# Patient Record
Sex: Male | Born: 1940 | Race: White | Hispanic: No | Marital: Single | Smoking: Former smoker
Health system: Southern US, Community
[De-identification: ages and names within clinical notes are randomized; demographics above are authoritative.]

## PROBLEM LIST (undated history)

## (undated) DIAGNOSIS — R338 Other retention of urine: Secondary | ICD-10-CM

## (undated) DIAGNOSIS — F0390 Unspecified dementia without behavioral disturbance: Secondary | ICD-10-CM

## (undated) DIAGNOSIS — N401 Enlarged prostate with lower urinary tract symptoms: Secondary | ICD-10-CM

## (undated) DIAGNOSIS — N138 Other obstructive and reflux uropathy: Secondary | ICD-10-CM

## (undated) DIAGNOSIS — R911 Solitary pulmonary nodule: Secondary | ICD-10-CM

## (undated) DIAGNOSIS — I10 Essential (primary) hypertension: Secondary | ICD-10-CM

## (undated) DIAGNOSIS — J45909 Unspecified asthma, uncomplicated: Secondary | ICD-10-CM

## (undated) DIAGNOSIS — I214 Non-ST elevation (NSTEMI) myocardial infarction: Secondary | ICD-10-CM

## (undated) DIAGNOSIS — E785 Hyperlipidemia, unspecified: Secondary | ICD-10-CM

## (undated) DIAGNOSIS — J449 Chronic obstructive pulmonary disease, unspecified: Secondary | ICD-10-CM

## (undated) DIAGNOSIS — I25110 Atherosclerotic heart disease of native coronary artery with unstable angina pectoris: Secondary | ICD-10-CM

## (undated) DIAGNOSIS — IMO0002 Reserved for concepts with insufficient information to code with codable children: Secondary | ICD-10-CM

## (undated) DIAGNOSIS — Z955 Presence of coronary angioplasty implant and graft: Secondary | ICD-10-CM

## (undated) DIAGNOSIS — R972 Elevated prostate specific antigen [PSA]: Secondary | ICD-10-CM

## (undated) HISTORY — PX: HERNIA REPAIR: SHX51

## (undated) HISTORY — PX: PROSTATE BIOPSY: SHX241

## (undated) HISTORY — PX: TONSILLECTOMY: SUR1361

---

## 2009-11-01 ENCOUNTER — Inpatient Hospital Stay (HOSPITAL_COMMUNITY): Admission: EM | Admit: 2009-11-01 | Discharge: 2009-11-05 | Payer: Self-pay | Admitting: Emergency Medicine

## 2009-11-01 ENCOUNTER — Encounter (INDEPENDENT_AMBULATORY_CARE_PROVIDER_SITE_OTHER): Payer: Self-pay | Admitting: Surgery

## 2011-01-08 LAB — COMPREHENSIVE METABOLIC PANEL
ALT: 16 U/L (ref 0–53)
Alkaline Phosphatase: 70 U/L (ref 39–117)
BUN: 17 mg/dL (ref 6–23)
Chloride: 99 mEq/L (ref 96–112)
Glucose, Bld: 114 mg/dL — ABNORMAL HIGH (ref 70–99)
Potassium: 3.8 mEq/L (ref 3.5–5.1)
Sodium: 131 mEq/L — ABNORMAL LOW (ref 135–145)
Total Bilirubin: 0.9 mg/dL (ref 0.3–1.2)

## 2011-01-08 LAB — CBC
HCT: 41.3 % (ref 39.0–52.0)
HCT: 41.7 % (ref 39.0–52.0)
HCT: 44.1 % (ref 39.0–52.0)
Hemoglobin: 14 g/dL (ref 13.0–17.0)
Hemoglobin: 14.7 g/dL (ref 13.0–17.0)
MCV: 89.6 fL (ref 78.0–100.0)
Platelets: 280 10*3/uL (ref 150–400)
RBC: 4.61 MIL/uL (ref 4.22–5.81)
RBC: 4.9 MIL/uL (ref 4.22–5.81)
RDW: 14 % (ref 11.5–15.5)
WBC: 14.3 10*3/uL — ABNORMAL HIGH (ref 4.0–10.5)

## 2011-01-08 LAB — URINALYSIS, ROUTINE W REFLEX MICROSCOPIC
Hgb urine dipstick: NEGATIVE
Nitrite: NEGATIVE
Protein, ur: NEGATIVE mg/dL
Specific Gravity, Urine: 1.025 (ref 1.005–1.030)
Urobilinogen, UA: 0.2 mg/dL (ref 0.0–1.0)

## 2011-01-08 LAB — DIFFERENTIAL
Basophils Absolute: 0 10*3/uL (ref 0.0–0.1)
Basophils Absolute: 0.1 10*3/uL (ref 0.0–0.1)
Basophils Relative: 1 % (ref 0–1)
Eosinophils Absolute: 0.1 10*3/uL (ref 0.0–0.7)
Eosinophils Absolute: 0.3 10*3/uL (ref 0.0–0.7)
Eosinophils Relative: 0 % (ref 0–5)
Eosinophils Relative: 3 % (ref 0–5)
Lymphocytes Relative: 7 % — ABNORMAL LOW (ref 12–46)
Lymphs Abs: 0.9 10*3/uL (ref 0.7–4.0)
Monocytes Absolute: 0.8 10*3/uL (ref 0.1–1.0)
Monocytes Absolute: 1.4 10*3/uL — ABNORMAL HIGH (ref 0.1–1.0)
Monocytes Relative: 10 % (ref 3–12)
Monocytes Relative: 6 % (ref 3–12)
Neutro Abs: 10.9 10*3/uL — ABNORMAL HIGH (ref 1.7–7.7)
Neutrophils Relative %: 77 % (ref 43–77)

## 2014-06-30 ENCOUNTER — Emergency Department (HOSPITAL_COMMUNITY): Payer: Medicare Other

## 2014-06-30 ENCOUNTER — Encounter (HOSPITAL_COMMUNITY): Payer: Self-pay | Admitting: Emergency Medicine

## 2014-06-30 ENCOUNTER — Emergency Department (HOSPITAL_COMMUNITY)
Admission: EM | Admit: 2014-06-30 | Discharge: 2014-06-30 | Disposition: A | Discharge status: Home / Self Care | Payer: Medicare Other | Attending: Emergency Medicine | Admitting: Emergency Medicine

## 2014-06-30 DIAGNOSIS — Z872 Personal history of diseases of the skin and subcutaneous tissue: Secondary | ICD-10-CM | POA: Insufficient documentation

## 2014-06-30 DIAGNOSIS — J45901 Unspecified asthma with (acute) exacerbation: Secondary | ICD-10-CM | POA: Diagnosis not present

## 2014-06-30 DIAGNOSIS — R42 Dizziness and giddiness: Secondary | ICD-10-CM | POA: Diagnosis present

## 2014-06-30 DIAGNOSIS — J40 Bronchitis, not specified as acute or chronic: Secondary | ICD-10-CM

## 2014-06-30 DIAGNOSIS — R269 Unspecified abnormalities of gait and mobility: Secondary | ICD-10-CM | POA: Insufficient documentation

## 2014-06-30 HISTORY — DX: Unspecified asthma, uncomplicated: J45.909

## 2014-06-30 HISTORY — DX: Reserved for concepts with insufficient information to code with codable children: IMO0002

## 2014-06-30 LAB — BASIC METABOLIC PANEL
Anion gap: 13 (ref 5–15)
BUN: 15 mg/dL (ref 6–23)
CHLORIDE: 98 meq/L (ref 96–112)
CO2: 27 mEq/L (ref 19–32)
CREATININE: 1.26 mg/dL (ref 0.50–1.35)
Calcium: 10 mg/dL (ref 8.4–10.5)
GFR calc Af Amer: 64 mL/min — ABNORMAL LOW (ref >90)
GFR, EST NON AFRICAN AMERICAN: 55 mL/min — AB (ref >90)
GLUCOSE: 120 mg/dL — AB (ref 70–99)
POTASSIUM: 3.9 meq/L (ref 3.7–5.3)
Sodium: 138 mEq/L (ref 137–147)

## 2014-06-30 LAB — URINALYSIS, ROUTINE W REFLEX MICROSCOPIC
Bilirubin Urine: NEGATIVE
GLUCOSE, UA: NEGATIVE mg/dL
HGB URINE DIPSTICK: NEGATIVE
Ketones, ur: NEGATIVE mg/dL
LEUKOCYTES UA: NEGATIVE
Nitrite: NEGATIVE
PH: 6 (ref 5.0–8.0)
Protein, ur: NEGATIVE mg/dL
Specific Gravity, Urine: 1.01 (ref 1.005–1.030)
Urobilinogen, UA: 0.2 mg/dL (ref 0.0–1.0)

## 2014-06-30 LAB — CBC WITH DIFFERENTIAL/PLATELET
Basophils Absolute: 0 10*3/uL (ref 0.0–0.1)
Basophils Relative: 0 % (ref 0–1)
EOS ABS: 0.4 10*3/uL (ref 0.0–0.7)
Eosinophils Relative: 2 % (ref 0–5)
HEMATOCRIT: 45.8 % (ref 39.0–52.0)
HEMOGLOBIN: 15.8 g/dL (ref 13.0–17.0)
LYMPHS ABS: 2 10*3/uL (ref 0.7–4.0)
Lymphocytes Relative: 11 % — ABNORMAL LOW (ref 12–46)
MCH: 30.4 pg (ref 26.0–34.0)
MCHC: 34.5 g/dL (ref 30.0–36.0)
MCV: 88.1 fL (ref 78.0–100.0)
MONO ABS: 1.5 10*3/uL — AB (ref 0.1–1.0)
MONOS PCT: 9 % (ref 3–12)
NEUTROS PCT: 78 % — AB (ref 43–77)
Neutro Abs: 13.7 10*3/uL — ABNORMAL HIGH (ref 1.7–7.7)
Platelets: 249 10*3/uL (ref 150–400)
RBC: 5.2 MIL/uL (ref 4.22–5.81)
RDW: 13.6 % (ref 11.5–15.5)
WBC: 17.6 10*3/uL — ABNORMAL HIGH (ref 4.0–10.5)

## 2014-06-30 MED ORDER — SODIUM CHLORIDE 0.9 % IV BOLUS (SEPSIS)
1000.0000 mL | Freq: Once | INTRAVENOUS | Status: AC
  Administered 2014-06-30: 1000 mL via INTRAVENOUS

## 2014-06-30 MED ORDER — LEVOFLOXACIN 500 MG PO TABS
500.0000 mg | ORAL_TABLET | Freq: Every day | ORAL | Status: DC
  Administered 2014-06-30: 500 mg via ORAL
  Filled 2014-06-30: qty 1

## 2014-06-30 MED ORDER — LEVOFLOXACIN 500 MG PO TABS: 500.0000 mg | ORAL_TABLET | Freq: Every day | ORAL | Status: DC

## 2014-06-30 MED ORDER — IPRATROPIUM-ALBUTEROL 0.5-2.5 (3) MG/3ML IN SOLN
3.0000 mL | Freq: Once | RESPIRATORY_TRACT | Status: AC
  Administered 2014-06-30: 3 mL via RESPIRATORY_TRACT
  Filled 2014-06-30: qty 3

## 2014-06-30 NOTE — ED Provider Notes (Signed)
CSN: [id number]     Arrival date & time 06/30/14  1909 History  This chart was scribed for Lee Dibbles, MD by Bronson Curb, ED Scribe. This patient was seen in room [facility] and the patient's care was started at 7:41 PM.     Chief Complaint  Patient presents with  . Gait Problem    The history is provided by the patient and the spouse. No language interpreter was used.    HPI Comments: Lee Chang is a 73 y.o. male who presents to the Emergency Department for staggering gait as a result of light-headedness that began this morning. He reports associated HA, fever (101.7 F), cough (since last night), SOB, clammy skin, and a BP of 196/85. Wife states she called patient's PCP and informed them of symptoms, and was told to come to the ED. He denies nausea, vomiting, diarrhea, rashes, or tick bites. Patient is a nonsmoker and denies any history of lung or breathing problems.   Past Medical History  Diagnosis Date  . Ulcer   . Asthma    History reviewed. No pertinent past surgical history. History reviewed. No pertinent family history. History  Substance Use Topics  . Smoking status: Never Smoker   . Smokeless tobacco: Not on file  . Alcohol Use: No    Review of Systems A complete 10 system review of systems was obtained and all systems are negative except as noted in the HPI and PMH.     Allergies  Asa  Home Medications   Prior to Admission medications   Not on File   Triage Vitals: BP 175/67  Pulse 81  Temp(Src) 99.9 F (37.7 C) (Oral)  Resp 20  Ht  (1.778 m)  Wt 191 lb 8 oz (86.864 kg)  BMI 27.48 kg/m2  SpO2 93%  Physical Exam  Nursing note and vitals reviewed. Constitutional: He is oriented to person, place, and time. He appears well-developed and well-nourished. No distress.  HENT:  Head: Normocephalic and atraumatic.  Right Ear: External ear normal.  Left Ear: External ear normal.  Mouth/Throat: Oropharynx is clear and moist.  Eyes:  Conjunctivae are normal. Right eye exhibits no discharge. Left eye exhibits no discharge. No scleral icterus.  Neck: Neck supple. No tracheal deviation present.  Cardiovascular: Normal rate, regular rhythm and intact distal pulses.   Pulmonary/Chest: Effort normal. No stridor. No respiratory distress. He has wheezes. He has no rales.  Abdominal: Soft. Bowel sounds are normal. He exhibits no distension. There is no tenderness. There is no rebound and no guarding.  Musculoskeletal: He exhibits no edema and no tenderness.  Neurological: He is alert and oriented to person, place, and time. He has normal strength. No cranial nerve deficit (No facial droop, extraocular movements intact, tongue midline ) or sensory deficit. He exhibits normal muscle tone. He displays no seizure activity. Coordination and gait normal.  No pronator drift bilateral upper extrem, able to hold both legs off bed for 5 seconds, sensation intact in all extremities, no visual field cuts, no left or right sided neglect, normal finger-nose exam bilaterally, no nystagmus noted.   Skin: Skin is warm and dry. No rash noted.  Psychiatric: He has a normal mood and affect.    ED Course  Procedures (including critical care time)  DIAGNOSTIC STUDIES: Oxygen Saturation is 93% on room air, adequate by my interpretation.    COORDINATION OF CARE: At 1946 Discussed treatment plan with patient which includes labs and CT scan. Patient agrees.  Labs Review Labs Reviewed  CBC WITH DIFFERENTIAL - Abnormal; Notable for the following:    WBC 17.6 (*)    Neutrophils Relative % 78 (*)    Neutro Abs 13.7 (*)    Lymphocytes Relative 11 (*)    Monocytes Absolute 1.5 (*)    All other components within normal limits  BASIC METABOLIC PANEL - Abnormal; Notable for the following:    Glucose, Bld 120 (*)    GFR calc non Af Amer 55 (*)    GFR calc Af Amer 64 (*)    All other components within normal limits  URINALYSIS, ROUTINE W REFLEX  MICROSCOPIC  I-STAT CG4 LACTIC ACID, ED   Basic metabolic panel normal except glucose 120, UA normal except trace ketones   Imaging Review Dg Chest 2 View  06/30/2014   CLINICAL DATA:  Dizziness  EXAM: CHEST  2 VIEW  COMPARISON:  None.  FINDINGS: Chronic interstitial markings/emphysematous changes. No focal consolidation. No pleural effusion or pneumothorax.  Heart is normal in size.  Mild degenerative changes of the visualized thoracolumbar spine.  IMPRESSION: No evidence of acute cardiopulmonary disease.   Electronically Signed   By: Charline Bills M.D.   On: 06/30/2014 21:17   Ct Head Wo Contrast  06/30/2014   CLINICAL DATA:  Unsteady gait and lightheadedness since this morning.  EXAM: CT HEAD WITHOUT CONTRAST  TECHNIQUE: Contiguous axial images were obtained from the base of the skull through the vertex without intravenous contrast.  COMPARISON:  None.  FINDINGS: Technically limited study due to motion artifact and positioning. Diffuse cerebral atrophy. Low-attenuation changes in the deep white matter consistent with small vessel ischemia. Old lacune or infarct in the left thalamus. No mass effect or midline shift. No abnormal extra-axial fluid collections. Gray-white matter junctions are distinct. Basal cisterns are not effaced. No evidence of acute intracranial hemorrhage. No depressed skull fractures. Opacification of a single right ethmoid air cell. Paranasal sinuses and mastoid air cells are otherwise clear. Vascular calcifications.  IMPRESSION: No acute intracranial abnormalities. Chronic atrophy and small vessel ischemic changes.   Electronically Signed   By: Burman Nieves M.D.   On: 06/30/2014 21:10    MDM   Final diagnoses:  Bronchitis    The patient reporting having a fever last night. Chest x-ray does not show pneumonia but on exam he did have some wheezes. Suspect a respiratory infection as the source of his fever and weakness.  The patient did have a normal gait in the  emergency room. He has no ataxia. Neurologic exam was normal. I doubt stroke.  Discussed the findings and plan with the family. Discharge home on oral antibiotics follow up with his doctor this week.  I personally performed the services described in this documentation, which was scribed in my presence.  The recorded information has been reviewed and is accurate.    Lee Dibbles, MD 06/30/14 2330

## 2014-06-30 NOTE — ED Notes (Signed)
Patient given discharge instruction, verbalized understand. IV removed, band aid applied. Patient ambulatory out of the department.  

## 2014-06-30 NOTE — Discharge Instructions (Signed)
Upper Respiratory Infection, Adult An upper respiratory infection (URI) is also known as the common cold. It is often caused by a type of germ (virus). Colds are easily spread (contagious). You can pass it to others by kissing, coughing, sneezing, or drinking out of the same glass. Usually, you get better in 1 or 2 weeks.  HOME CARE   Only take medicine as told by your doctor.  Use a warm mist humidifier or breathe in steam from a hot shower.  Drink enough water and fluids to keep your pee (urine) clear or pale yellow.  Get plenty of rest.  Return to work when your temperature is back to normal or as told by your doctor. You may use a face mask and wash your hands to stop your cold from spreading. GET HELP RIGHT AWAY IF:   After the first few days, you feel you are getting worse.  You have questions about your medicine.  You have chills, shortness of breath, or brown or red spit (mucus).  You have yellow or brown snot (nasal discharge) or pain in the face, especially when you bend forward.  You have a fever, puffy (swollen) neck, pain when you swallow, or white spots in the back of your throat.  You have a bad headache, ear pain, sinus pain, or chest pain.  You have a high-pitched whistling sound when you breathe in and out (wheezing).  You have a lasting cough or cough up blood.  You have sore muscles or a stiff neck. MAKE SURE YOU:   Understand these instructions.  Will watch your condition.  Will get help right away if you are not doing well or get worse. Document Released: 03/27/2008 Document Revised: 01/01/2012 Document Reviewed: 01/14/2014 [employer] Patient Information 2015 [employer]. This information is not intended to replace advice given to you by your health care provider. Make sure you discuss any questions you have with your health care provider.  

## 2014-06-30 NOTE — ED Notes (Signed)
MD at the bedside  

## 2014-06-30 NOTE — ED Notes (Signed)
Pt eating peanut butter graham cracker

## 2014-06-30 NOTE — ED Notes (Addendum)
States has been feeling light headed, "cloudy thoughts", staggering gait. Wife called PCP and informed of symptoms and was told to come to the ED.  The pt states he feels a little light headed and has had a head ache over the last day or two. Grip strength equal, no apparent facial droop or tongue deviation. Pt states he did cough when drinking, but he had been coughing on and off through the night.

## 2014-07-01 LAB — I-STAT CG4 LACTIC ACID, ED: Lactic Acid, Venous: 1.31 mmol/L (ref 0.5–2.2)

## 2014-12-25 DIAGNOSIS — R972 Elevated prostate specific antigen [PSA]: Secondary | ICD-10-CM | POA: Diagnosis not present

## 2014-12-31 DIAGNOSIS — R972 Elevated prostate specific antigen [PSA]: Secondary | ICD-10-CM | POA: Diagnosis not present

## 2014-12-31 DIAGNOSIS — N401 Enlarged prostate with lower urinary tract symptoms: Secondary | ICD-10-CM | POA: Diagnosis not present

## 2014-12-31 DIAGNOSIS — N139 Obstructive and reflux uropathy, unspecified: Secondary | ICD-10-CM | POA: Diagnosis not present

## 2015-02-11 DIAGNOSIS — H01021 Squamous blepharitis right upper eyelid: Secondary | ICD-10-CM | POA: Diagnosis not present

## 2015-02-11 DIAGNOSIS — H01022 Squamous blepharitis right lower eyelid: Secondary | ICD-10-CM | POA: Diagnosis not present

## 2015-02-11 DIAGNOSIS — H01025 Squamous blepharitis left lower eyelid: Secondary | ICD-10-CM | POA: Diagnosis not present

## 2015-02-11 DIAGNOSIS — H01024 Squamous blepharitis left upper eyelid: Secondary | ICD-10-CM | POA: Diagnosis not present

## 2015-02-23 DIAGNOSIS — H01022 Squamous blepharitis right lower eyelid: Secondary | ICD-10-CM | POA: Diagnosis not present

## 2015-02-23 DIAGNOSIS — H01021 Squamous blepharitis right upper eyelid: Secondary | ICD-10-CM | POA: Diagnosis not present

## 2015-02-23 DIAGNOSIS — H01024 Squamous blepharitis left upper eyelid: Secondary | ICD-10-CM | POA: Diagnosis not present

## 2015-02-23 DIAGNOSIS — H01025 Squamous blepharitis left lower eyelid: Secondary | ICD-10-CM | POA: Diagnosis not present

## 2015-03-24 DIAGNOSIS — H2513 Age-related nuclear cataract, bilateral: Secondary | ICD-10-CM | POA: Diagnosis not present

## 2015-04-12 DIAGNOSIS — I10 Essential (primary) hypertension: Secondary | ICD-10-CM | POA: Diagnosis not present

## 2015-04-12 DIAGNOSIS — R42 Dizziness and giddiness: Secondary | ICD-10-CM | POA: Diagnosis not present

## 2015-04-12 DIAGNOSIS — J449 Chronic obstructive pulmonary disease, unspecified: Secondary | ICD-10-CM | POA: Diagnosis not present

## 2015-04-23 DIAGNOSIS — H2511 Age-related nuclear cataract, right eye: Secondary | ICD-10-CM | POA: Diagnosis not present

## 2015-04-28 DIAGNOSIS — N400 Enlarged prostate without lower urinary tract symptoms: Secondary | ICD-10-CM | POA: Diagnosis not present

## 2015-04-28 DIAGNOSIS — I10 Essential (primary) hypertension: Secondary | ICD-10-CM | POA: Diagnosis not present

## 2015-04-28 DIAGNOSIS — J449 Chronic obstructive pulmonary disease, unspecified: Secondary | ICD-10-CM | POA: Diagnosis not present

## 2015-06-16 DIAGNOSIS — H2511 Age-related nuclear cataract, right eye: Secondary | ICD-10-CM | POA: Diagnosis not present

## 2015-06-16 DIAGNOSIS — H2512 Age-related nuclear cataract, left eye: Secondary | ICD-10-CM | POA: Diagnosis not present

## 2015-06-16 DIAGNOSIS — H40019 Open angle with borderline findings, low risk, unspecified eye: Secondary | ICD-10-CM | POA: Diagnosis not present

## 2015-06-17 DIAGNOSIS — H2512 Age-related nuclear cataract, left eye: Secondary | ICD-10-CM | POA: Diagnosis not present

## 2015-07-08 DIAGNOSIS — H2511 Age-related nuclear cataract, right eye: Secondary | ICD-10-CM | POA: Diagnosis not present

## 2015-07-08 DIAGNOSIS — H2512 Age-related nuclear cataract, left eye: Secondary | ICD-10-CM | POA: Diagnosis not present

## 2015-08-02 DIAGNOSIS — J449 Chronic obstructive pulmonary disease, unspecified: Secondary | ICD-10-CM | POA: Diagnosis not present

## 2015-08-02 DIAGNOSIS — G3184 Mild cognitive impairment, so stated: Secondary | ICD-10-CM | POA: Diagnosis not present

## 2015-08-02 DIAGNOSIS — I10 Essential (primary) hypertension: Secondary | ICD-10-CM | POA: Diagnosis not present

## 2015-08-12 DIAGNOSIS — I6782 Cerebral ischemia: Secondary | ICD-10-CM | POA: Diagnosis not present

## 2015-08-12 DIAGNOSIS — F0390 Unspecified dementia without behavioral disturbance: Secondary | ICD-10-CM | POA: Diagnosis not present

## 2015-08-12 DIAGNOSIS — G319 Degenerative disease of nervous system, unspecified: Secondary | ICD-10-CM | POA: Diagnosis not present

## 2015-08-16 DIAGNOSIS — G3184 Mild cognitive impairment, so stated: Secondary | ICD-10-CM | POA: Diagnosis not present

## 2015-08-18 DIAGNOSIS — J441 Chronic obstructive pulmonary disease with (acute) exacerbation: Secondary | ICD-10-CM | POA: Diagnosis not present

## 2015-08-18 DIAGNOSIS — J440 Chronic obstructive pulmonary disease with acute lower respiratory infection: Secondary | ICD-10-CM | POA: Diagnosis not present

## 2015-08-18 DIAGNOSIS — G3184 Mild cognitive impairment, so stated: Secondary | ICD-10-CM | POA: Diagnosis not present

## 2015-08-18 DIAGNOSIS — Z888 Allergy status to other drugs, medicaments and biological substances status: Secondary | ICD-10-CM | POA: Diagnosis not present

## 2015-08-18 DIAGNOSIS — R0602 Shortness of breath: Secondary | ICD-10-CM | POA: Diagnosis not present

## 2015-08-18 DIAGNOSIS — F17201 Nicotine dependence, unspecified, in remission: Secondary | ICD-10-CM | POA: Diagnosis not present

## 2015-08-18 DIAGNOSIS — I10 Essential (primary) hypertension: Secondary | ICD-10-CM | POA: Diagnosis not present

## 2015-08-18 DIAGNOSIS — Z79899 Other long term (current) drug therapy: Secondary | ICD-10-CM | POA: Diagnosis not present

## 2015-08-18 DIAGNOSIS — Z886 Allergy status to analgesic agent status: Secondary | ICD-10-CM | POA: Diagnosis not present

## 2015-08-18 DIAGNOSIS — R05 Cough: Secondary | ICD-10-CM | POA: Diagnosis not present

## 2015-08-19 DIAGNOSIS — J441 Chronic obstructive pulmonary disease with (acute) exacerbation: Secondary | ICD-10-CM | POA: Diagnosis not present

## 2015-08-19 DIAGNOSIS — R6889 Other general symptoms and signs: Secondary | ICD-10-CM | POA: Diagnosis not present

## 2015-09-27 DIAGNOSIS — G3184 Mild cognitive impairment, so stated: Secondary | ICD-10-CM | POA: Diagnosis not present

## 2015-09-27 DIAGNOSIS — J449 Chronic obstructive pulmonary disease, unspecified: Secondary | ICD-10-CM | POA: Diagnosis not present

## 2015-09-27 DIAGNOSIS — I10 Essential (primary) hypertension: Secondary | ICD-10-CM | POA: Diagnosis not present

## 2015-10-20 DIAGNOSIS — J4542 Moderate persistent asthma with status asthmaticus: Secondary | ICD-10-CM | POA: Diagnosis not present

## 2015-10-20 DIAGNOSIS — J449 Chronic obstructive pulmonary disease, unspecified: Secondary | ICD-10-CM | POA: Diagnosis not present

## 2015-11-10 DIAGNOSIS — K429 Umbilical hernia without obstruction or gangrene: Secondary | ICD-10-CM | POA: Diagnosis not present

## 2015-11-10 DIAGNOSIS — I10 Essential (primary) hypertension: Secondary | ICD-10-CM | POA: Diagnosis not present

## 2015-11-10 DIAGNOSIS — J449 Chronic obstructive pulmonary disease, unspecified: Secondary | ICD-10-CM | POA: Diagnosis not present

## 2015-11-10 DIAGNOSIS — G3184 Mild cognitive impairment, so stated: Secondary | ICD-10-CM | POA: Diagnosis not present

## 2015-11-10 DIAGNOSIS — R05 Cough: Secondary | ICD-10-CM | POA: Diagnosis not present

## 2015-11-23 DIAGNOSIS — I10 Essential (primary) hypertension: Secondary | ICD-10-CM | POA: Diagnosis not present

## 2015-11-23 DIAGNOSIS — G251 Drug-induced tremor: Secondary | ICD-10-CM | POA: Diagnosis not present

## 2015-11-23 DIAGNOSIS — G3184 Mild cognitive impairment, so stated: Secondary | ICD-10-CM | POA: Diagnosis not present

## 2015-12-14 DIAGNOSIS — S40021A Contusion of right upper arm, initial encounter: Secondary | ICD-10-CM | POA: Diagnosis not present

## 2015-12-31 ENCOUNTER — Inpatient Hospital Stay (HOSPITAL_COMMUNITY)
Admission: EM | Admit: 2015-12-31 | Discharge: 2016-01-11 | DRG: 246 | Disposition: A | Discharge status: Skilled Nursing Facility | Payer: Medicare Other | Attending: Cardiovascular Disease | Admitting: Cardiovascular Disease

## 2015-12-31 ENCOUNTER — Encounter (HOSPITAL_COMMUNITY): Payer: Self-pay | Admitting: *Deleted

## 2015-12-31 ENCOUNTER — Emergency Department (HOSPITAL_COMMUNITY): Payer: Medicare Other

## 2015-12-31 DIAGNOSIS — I639 Cerebral infarction, unspecified: Secondary | ICD-10-CM

## 2015-12-31 DIAGNOSIS — R4182 Altered mental status, unspecified: Secondary | ICD-10-CM

## 2015-12-31 DIAGNOSIS — E876 Hypokalemia: Secondary | ICD-10-CM | POA: Diagnosis present

## 2015-12-31 DIAGNOSIS — Z955 Presence of coronary angioplasty implant and graft: Secondary | ICD-10-CM | POA: Diagnosis not present

## 2015-12-31 DIAGNOSIS — E785 Hyperlipidemia, unspecified: Secondary | ICD-10-CM | POA: Clinically undetermined

## 2015-12-31 DIAGNOSIS — Z79899 Other long term (current) drug therapy: Secondary | ICD-10-CM | POA: Diagnosis not present

## 2015-12-31 DIAGNOSIS — I10 Essential (primary) hypertension: Secondary | ICD-10-CM | POA: Diagnosis present

## 2015-12-31 DIAGNOSIS — J449 Chronic obstructive pulmonary disease, unspecified: Secondary | ICD-10-CM | POA: Diagnosis present

## 2015-12-31 DIAGNOSIS — G9340 Encephalopathy, unspecified: Secondary | ICD-10-CM | POA: Diagnosis not present

## 2015-12-31 DIAGNOSIS — E870 Hyperosmolality and hypernatremia: Secondary | ICD-10-CM | POA: Diagnosis not present

## 2015-12-31 DIAGNOSIS — R0789 Other chest pain: Secondary | ICD-10-CM | POA: Diagnosis not present

## 2015-12-31 DIAGNOSIS — R400 Somnolence: Secondary | ICD-10-CM | POA: Diagnosis not present

## 2015-12-31 DIAGNOSIS — R079 Chest pain, unspecified: Secondary | ICD-10-CM

## 2015-12-31 DIAGNOSIS — E860 Dehydration: Secondary | ICD-10-CM | POA: Diagnosis present

## 2015-12-31 DIAGNOSIS — M7989 Other specified soft tissue disorders: Secondary | ICD-10-CM | POA: Diagnosis not present

## 2015-12-31 DIAGNOSIS — R0902 Hypoxemia: Secondary | ICD-10-CM

## 2015-12-31 DIAGNOSIS — J45909 Unspecified asthma, uncomplicated: Secondary | ICD-10-CM | POA: Diagnosis not present

## 2015-12-31 DIAGNOSIS — I700 Atherosclerosis of aorta: Secondary | ICD-10-CM | POA: Diagnosis not present

## 2015-12-31 DIAGNOSIS — R410 Disorientation, unspecified: Secondary | ICD-10-CM

## 2015-12-31 DIAGNOSIS — Z8711 Personal history of peptic ulcer disease: Secondary | ICD-10-CM

## 2015-12-31 DIAGNOSIS — D72829 Elevated white blood cell count, unspecified: Secondary | ICD-10-CM

## 2015-12-31 DIAGNOSIS — R319 Hematuria, unspecified: Secondary | ICD-10-CM

## 2015-12-31 DIAGNOSIS — R338 Other retention of urine: Secondary | ICD-10-CM | POA: Diagnosis not present

## 2015-12-31 DIAGNOSIS — J410 Simple chronic bronchitis: Secondary | ICD-10-CM | POA: Diagnosis not present

## 2015-12-31 DIAGNOSIS — I214 Non-ST elevation (NSTEMI) myocardial infarction: Principal | ICD-10-CM | POA: Diagnosis present

## 2015-12-31 DIAGNOSIS — N179 Acute kidney failure, unspecified: Secondary | ICD-10-CM

## 2015-12-31 DIAGNOSIS — F0390 Unspecified dementia without behavioral disturbance: Secondary | ICD-10-CM | POA: Diagnosis present

## 2015-12-31 DIAGNOSIS — I158 Other secondary hypertension: Secondary | ICD-10-CM | POA: Diagnosis not present

## 2015-12-31 DIAGNOSIS — J440 Chronic obstructive pulmonary disease with acute lower respiratory infection: Secondary | ICD-10-CM | POA: Diagnosis not present

## 2015-12-31 DIAGNOSIS — Z7982 Long term (current) use of aspirin: Secondary | ICD-10-CM

## 2015-12-31 DIAGNOSIS — R001 Bradycardia, unspecified: Secondary | ICD-10-CM | POA: Diagnosis not present

## 2015-12-31 DIAGNOSIS — J441 Chronic obstructive pulmonary disease with (acute) exacerbation: Secondary | ICD-10-CM | POA: Diagnosis present

## 2015-12-31 DIAGNOSIS — Z66 Do not resuscitate: Secondary | ICD-10-CM | POA: Diagnosis not present

## 2015-12-31 DIAGNOSIS — I25110 Atherosclerotic heart disease of native coronary artery with unstable angina pectoris: Secondary | ICD-10-CM | POA: Diagnosis not present

## 2015-12-31 DIAGNOSIS — N400 Enlarged prostate without lower urinary tract symptoms: Secondary | ICD-10-CM | POA: Diagnosis not present

## 2015-12-31 DIAGNOSIS — F05 Delirium due to known physiological condition: Secondary | ICD-10-CM | POA: Diagnosis not present

## 2015-12-31 DIAGNOSIS — J189 Pneumonia, unspecified organism: Secondary | ICD-10-CM | POA: Diagnosis not present

## 2015-12-31 DIAGNOSIS — R911 Solitary pulmonary nodule: Secondary | ICD-10-CM | POA: Diagnosis not present

## 2015-12-31 DIAGNOSIS — J438 Other emphysema: Secondary | ICD-10-CM | POA: Diagnosis not present

## 2015-12-31 DIAGNOSIS — R509 Fever, unspecified: Secondary | ICD-10-CM

## 2015-12-31 HISTORY — DX: Other retention of urine: R33.8

## 2015-12-31 HISTORY — DX: Unspecified dementia, unspecified severity, without behavioral disturbance, psychotic disturbance, mood disturbance, and anxiety: F03.90

## 2015-12-31 HISTORY — DX: Atherosclerotic heart disease of native coronary artery with unstable angina pectoris: I25.110

## 2015-12-31 HISTORY — DX: Benign prostatic hyperplasia with lower urinary tract symptoms: N13.8

## 2015-12-31 HISTORY — DX: Non-ST elevation (NSTEMI) myocardial infarction: I21.4

## 2015-12-31 HISTORY — DX: Presence of coronary angioplasty implant and graft: Z95.5

## 2015-12-31 HISTORY — DX: Essential (primary) hypertension: I10

## 2015-12-31 HISTORY — DX: Other obstructive and reflux uropathy: N40.1

## 2015-12-31 HISTORY — DX: Chronic obstructive pulmonary disease, unspecified: J44.9

## 2015-12-31 HISTORY — DX: Hyperlipidemia, unspecified: E78.5

## 2015-12-31 HISTORY — DX: Solitary pulmonary nodule: R91.1

## 2015-12-31 LAB — COMPREHENSIVE METABOLIC PANEL
ALBUMIN: 3.9 g/dL (ref 3.5–5.0)
ALT: 17 U/L (ref 17–63)
ANION GAP: 6 (ref 5–15)
AST: 18 U/L (ref 15–41)
Alkaline Phosphatase: 62 U/L (ref 38–126)
BUN: 25 mg/dL — ABNORMAL HIGH (ref 6–20)
CALCIUM: 9.4 mg/dL (ref 8.9–10.3)
CO2: 29 mmol/L (ref 22–32)
Chloride: 104 mmol/L (ref 101–111)
Creatinine, Ser: 1.35 mg/dL — ABNORMAL HIGH (ref 0.61–1.24)
GFR calc non Af Amer: 50 mL/min — ABNORMAL LOW (ref >60)
GFR, EST AFRICAN AMERICAN: 58 mL/min — AB (ref >60)
GLUCOSE: 183 mg/dL — AB (ref 65–99)
POTASSIUM: 3.3 mmol/L — AB (ref 3.5–5.1)
SODIUM: 139 mmol/L (ref 135–145)
Total Bilirubin: 0.5 mg/dL (ref 0.3–1.2)
Total Protein: 6.8 g/dL (ref 6.5–8.1)

## 2015-12-31 LAB — CBC WITH DIFFERENTIAL/PLATELET
BASOS PCT: 0 %
Basophils Absolute: 0 10*3/uL (ref 0.0–0.1)
EOS ABS: 0.4 10*3/uL (ref 0.0–0.7)
EOS PCT: 3 %
HCT: 47 % (ref 39.0–52.0)
Hemoglobin: 16 g/dL (ref 13.0–17.0)
LYMPHS ABS: 1.6 10*3/uL (ref 0.7–4.0)
Lymphocytes Relative: 11 %
MCH: 30.5 pg (ref 26.0–34.0)
MCHC: 34 g/dL (ref 30.0–36.0)
MCV: 89.5 fL (ref 78.0–100.0)
MONO ABS: 1.4 10*3/uL — AB (ref 0.1–1.0)
MONOS PCT: 10 %
NEUTROS PCT: 76 %
Neutro Abs: 10.3 10*3/uL — ABNORMAL HIGH (ref 1.7–7.7)
PLATELETS: 216 10*3/uL (ref 150–400)
RBC: 5.25 MIL/uL (ref 4.22–5.81)
RDW: 13.6 % (ref 11.5–15.5)
WBC: 13.7 10*3/uL — ABNORMAL HIGH (ref 4.0–10.5)

## 2015-12-31 LAB — TROPONIN I: Troponin I: <0.03 ng/mL (ref <0.031)

## 2015-12-31 MED ORDER — MORPHINE SULFATE (PF) 2 MG/ML IV SOLN
2.0000 mg | Freq: Once | INTRAVENOUS | Status: AC
  Administered 2015-12-31: 2 mg via INTRAVENOUS
  Filled 2015-12-31: qty 1

## 2015-12-31 MED ORDER — IOHEXOL 350 MG/ML SOLN
100.0000 mL | Freq: Once | INTRAVENOUS | Status: AC | PRN
  Administered 2015-12-31: 100 mL via INTRAVENOUS

## 2015-12-31 NOTE — ED Notes (Signed)
Pt c/o centralized chest pain and nausea since 8pm. Pt reports and aching/pressure pain.

## 2015-12-31 NOTE — ED Provider Notes (Signed)
CSN: [id number]     Arrival date & time 12/31/15  2202 History  By signing my name below, I, Linna DarnerRussell Turner, attest that this documentation has been prepared under the direction and in the presence of physician practitioner, Blane OharaJoshua Trammell Bowden, MD. Electronically Signed: Linna Darnerussell Turner, Scribe. 12/31/2015. 10:59 PM.    Chief Complaint  Patient presents with  . Chest Pain    The history is provided by the patient and a relative. No language interpreter was used.     HPI Comments: Lee Chang is a 75 y.o. male with h/o HTN, COPD, asthma, and bleeding ulcer who presents to the Emergency Department complaining of sudden onset, constant, improving central chest pain beginning at approximately 830PM tonight. Pt notes that he experienced similar symptoms sometimes last year. He notes that his chest pain has been improving but notes that he currently feels some pressure in his chest. Pt endorses associated nausea as well. He reports that his chest pain radiates through his back but does not radiate into arms or jaw. He had a cough for the last 5 months, but was recently taken off of his HTN medication with severe cough alleviation. Pt does not take aspirin because of a previous bleeding ulcer but he does take Tylenol. 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 Pt has no h/o DM. He has no h/o blood clots in his lungs or legs. Pt's father died of stroke. He has not had any surgeries in the last three months. He has had bilateral ankle swelling in the past. Pt had a stress test done years ago. Pt denies numbness or any other associated symptoms.   Past Medical History  Diagnosis Date  . Ulcer   . Asthma   . Hypertension    Past Surgical History  Procedure Laterality Date  . Hernia repair     History reviewed. No pertinent family history. Social History  Substance Use Topics  . Smoking status: Never Smoker   . Smokeless tobacco: None  .  Alcohol Use: No    Review of Systems  Respiratory: Positive for cough.   Cardiovascular: Positive for chest pain.  Neurological: Negative for numbness.  All other systems reviewed and are negative.     Allergies  Asa  Home Medications   Prior to Admission medications   Medication Sig Start Date End Date Taking? Authorizing Provider  cloNIDine (CATAPRES) 0.1 MG tablet Take 1 tablet by mouth daily. 06/19/14   Historical Provider, MD  enalapril (VASOTEC) 20 MG tablet Take 20 mg by mouth daily. 05/06/14   Historical Provider, MD  finasteride (PROSCAR) 5 MG tablet Take 5 mg by mouth 2 (two) times daily.  06/09/14   Historical Provider, MD  levofloxacin (LEVAQUIN) 500 MG tablet Take 1 tablet (500 mg total) by mouth daily. 06/30/14   Linwood DibblesJon Knapp, MD  tamsulosin (FLOMAX) 0.4 MG CAPS capsule Take 1 capsule by mouth 2 (two) times daily. 06/09/14   Historical Provider, MD  VENTOLIN HFA 108 (90 BASE) MCG/ACT inhaler Inhale 2 puffs into the lungs every 6 (six) hours as needed for wheezing or shortness of breath.  06/30/14   Historical Provider, MD   BP 165/87 mmHg  Pulse 72  Temp(Src) 97.7 F (36.5 C) (Oral)  Resp 20  Ht 5\' 8"  (1.727 m)  Wt 192 lb (87.091 kg)  BMI 29.20 kg/m2  SpO2 93% Physical Exam  Constitutional: He is oriented to person, place, and time. He appears  well-developed and well-nourished. No distress.  HENT:  Head: Normocephalic and atraumatic.  Eyes: Conjunctivae and EOM are normal.  Neck: Neck supple. No tracheal deviation present.  Cardiovascular: Normal rate and regular rhythm.   Anterior lung fields clear Strong equal, radial 2+ pulses      Pulmonary/Chest: Effort normal. No respiratory distress. He has wheezes.  Mild expatory wheezes bilaterally  Abdominal: Soft. There is no tenderness.  Musculoskeletal: Normal range of motion.  No significant swelling in legs  Neurological: He is alert and oriented to person, place, and time.  Skin: Skin is warm and dry.   Psychiatric: He has a normal mood and affect. His behavior is normal.  Nursing note and vitals reviewed.   ED Course  Procedures (including critical care time)  DIAGNOSTIC STUDIES: Oxygen Saturation is 95% on RA, adequate by my interpretation.    COORDINATION OF CARE: 11:00 PM Will administer Omnipaque injection and morphine injection. Will order CT Angio Chest and CTA Abd/Pel with and/or without contrast. Discussed treatment plan with pt at bedside and pt agreed to plan.   Labs Review Labs Reviewed  CBC WITH DIFFERENTIAL/PLATELET - Abnormal; Notable for the following:    WBC 13.7 (*)    Neutro Abs 10.3 (*)    Monocytes Absolute 1.4 (*)    All other components within normal limits  COMPREHENSIVE METABOLIC PANEL - Abnormal; Notable for the following:    Potassium 3.3 (*)    Glucose, Bld 183 (*)    BUN 25 (*)    Creatinine, Ser 1.35 (*)    GFR calc non Af Amer 50 (*)    GFR calc Af Amer 58 (*)    All other components within normal limits  TROPONIN I    Imaging Review No results found. I have personally reviewed and evaluated these images and lab results as part of my medical decision-making.   EKG Interpretation   Date/Time:  Friday December 31 2015 22:15:56 EST Ventricular Rate:  89 PR Interval:  162 QRS Duration: 136 QT Interval:  363 QTC Calculation: 442 R Axis:   89 Text Interpretation:  Sinus rhythm Right bundle branch block ST depression  V2-3 Confirmed by Mecca Guitron  MD, Earnestine Tuohey ([id number]) on 12/31/2015 10:41:04 PM     Repeat EKG reviewed 68 heart rate, normal QT, right bundle branch block, mild ST depression V2 and nonspecific ST changes inferiorly. Discussed this with cardiology. MDM   Final diagnoses:  Acute chest pain   Patient presents with worsening centralized chest pain radiating between the shoulder blades. Patient symptoms mild currently. Patient has also history advised not to take aspirin. Discussed if this is his heart he may need aspirin risks and  benefits discussed. Plan to hold on aspirin unless troponin elevated. No abd pain on exam. Plan for CT angiogram to look for dissection, troponin and telemetry observation. Patient's care be signed out to follow-up troponin and CT results to update triad hospitalist.  Spoke with cardiology, if troponin and CT scan unremarkable they're comfortable keeping the patient here in telemetry. The patients results and plan were reviewed and discussed.   Any x-rays performed were independently reviewed by myself.   Differential diagnosis were considered with the presenting HPI.  Medications  iohexol (OMNIPAQUE) 350 MG/ML injection 100 mL (not administered)  morphine 2 MG/ML injection 2 mg (2 mg Intravenous Given 12/31/15 2313)    Filed Vitals:   12/31/15 2210 12/31/15 2230 12/31/15 2245 12/31/15 2300  BP: 176/74 163/76  165/87  Pulse: 89 80 82  72  Temp: 97.7 F (36.5 C)     TempSrc: Oral     Resp: Height:  (1.727 m)     Weight: 192 lb (87.091 kg)     SpO2: 95% 93% 97% 93%    Final diagnoses:  Acute chest pain     Blane Ohara, MD 12/31/15 2337

## 2015-12-31 NOTE — ED Provider Notes (Signed)
12:00 AM  Assumed care from Dr. Jodi MourningZavitz.  Pt is a 75 y.o. male with history of hypertension, COPD who presents to the emergency department with chest pain. Has some nonspecific EKG changes. First troponin negative. Plan is for CT of his chest and abdomen to rule out dissection.  If this is negative, patient is to be admitted for chest pain rule out. Cardiology has been consulted and agree he can be admitted to medicine service at [facility].     1:00 AM  Pt's CT scan shows normal caliber aorta without dissection or aneurysm. He does appear to have a opacity in the right lower lobe that may be infectious in nature. Family does report he has had a productive cough for the past several days but no fevers or chills. Does have a leukocytosis with left shift. No recent hospitalization. We'll treat for community-acquired pneumonia with azithromycin and ceftriaxone. Plan was to admit patient for chest pain rule out. Will discuss with hospitalist. His PCP is with [facility].  Patient currently denies chest pain. He is hemodynamically stable. No hypoxia.   Discuss with Dr. Sharl MaLama for admission to telemetry, observation bed. Care transferred to hospitalist service.  Layla MawKristen N Ward, DO 01/01/16 [phone]

## 2016-01-01 ENCOUNTER — Encounter (HOSPITAL_COMMUNITY)
Admission: EM | Disposition: A | Discharge status: Skilled Nursing Facility | Payer: Self-pay | Source: Home / Self Care | Attending: Cardiovascular Disease

## 2016-01-01 ENCOUNTER — Ambulatory Visit (HOSPITAL_COMMUNITY): Admit: 2016-01-01 | Payer: Self-pay | Admitting: Cardiology

## 2016-01-01 ENCOUNTER — Observation Stay (HOSPITAL_COMMUNITY): Payer: Medicare Other

## 2016-01-01 DIAGNOSIS — R509 Fever, unspecified: Secondary | ICD-10-CM | POA: Diagnosis not present

## 2016-01-01 DIAGNOSIS — R079 Chest pain, unspecified: Secondary | ICD-10-CM | POA: Diagnosis not present

## 2016-01-01 DIAGNOSIS — E860 Dehydration: Secondary | ICD-10-CM | POA: Diagnosis present

## 2016-01-01 DIAGNOSIS — J189 Pneumonia, unspecified organism: Secondary | ICD-10-CM

## 2016-01-01 DIAGNOSIS — R319 Hematuria, unspecified: Secondary | ICD-10-CM | POA: Diagnosis not present

## 2016-01-01 DIAGNOSIS — F05 Delirium due to known physiological condition: Secondary | ICD-10-CM | POA: Diagnosis not present

## 2016-01-01 DIAGNOSIS — I10 Essential (primary) hypertension: Secondary | ICD-10-CM | POA: Diagnosis present

## 2016-01-01 DIAGNOSIS — E785 Hyperlipidemia, unspecified: Secondary | ICD-10-CM | POA: Diagnosis not present

## 2016-01-01 DIAGNOSIS — R001 Bradycardia, unspecified: Secondary | ICD-10-CM | POA: Diagnosis not present

## 2016-01-01 DIAGNOSIS — N400 Enlarged prostate without lower urinary tract symptoms: Secondary | ICD-10-CM | POA: Diagnosis present

## 2016-01-01 DIAGNOSIS — R911 Solitary pulmonary nodule: Secondary | ICD-10-CM | POA: Diagnosis not present

## 2016-01-01 DIAGNOSIS — E876 Hypokalemia: Secondary | ICD-10-CM | POA: Diagnosis not present

## 2016-01-01 DIAGNOSIS — J9811 Atelectasis: Secondary | ICD-10-CM | POA: Diagnosis not present

## 2016-01-01 DIAGNOSIS — I158 Other secondary hypertension: Secondary | ICD-10-CM | POA: Diagnosis present

## 2016-01-01 DIAGNOSIS — Z7982 Long term (current) use of aspirin: Secondary | ICD-10-CM | POA: Diagnosis not present

## 2016-01-01 DIAGNOSIS — J440 Chronic obstructive pulmonary disease with acute lower respiratory infection: Secondary | ICD-10-CM | POA: Diagnosis present

## 2016-01-01 DIAGNOSIS — J45909 Unspecified asthma, uncomplicated: Secondary | ICD-10-CM | POA: Diagnosis present

## 2016-01-01 DIAGNOSIS — M7989 Other specified soft tissue disorders: Secondary | ICD-10-CM | POA: Diagnosis not present

## 2016-01-01 DIAGNOSIS — J441 Chronic obstructive pulmonary disease with (acute) exacerbation: Secondary | ICD-10-CM | POA: Diagnosis present

## 2016-01-01 DIAGNOSIS — G9340 Encephalopathy, unspecified: Secondary | ICD-10-CM | POA: Diagnosis not present

## 2016-01-01 DIAGNOSIS — M6281 Muscle weakness (generalized): Secondary | ICD-10-CM | POA: Diagnosis not present

## 2016-01-01 DIAGNOSIS — I25110 Atherosclerotic heart disease of native coronary artery with unstable angina pectoris: Secondary | ICD-10-CM

## 2016-01-01 DIAGNOSIS — I214 Non-ST elevation (NSTEMI) myocardial infarction: Secondary | ICD-10-CM | POA: Diagnosis present

## 2016-01-01 DIAGNOSIS — J449 Chronic obstructive pulmonary disease, unspecified: Secondary | ICD-10-CM

## 2016-01-01 DIAGNOSIS — R278 Other lack of coordination: Secondary | ICD-10-CM | POA: Diagnosis not present

## 2016-01-01 DIAGNOSIS — R2232 Localized swelling, mass and lump, left upper limb: Secondary | ICD-10-CM | POA: Diagnosis not present

## 2016-01-01 DIAGNOSIS — R4182 Altered mental status, unspecified: Secondary | ICD-10-CM | POA: Diagnosis not present

## 2016-01-01 DIAGNOSIS — Z79899 Other long term (current) drug therapy: Secondary | ICD-10-CM | POA: Diagnosis not present

## 2016-01-01 DIAGNOSIS — R339 Retention of urine, unspecified: Secondary | ICD-10-CM | POA: Diagnosis not present

## 2016-01-01 DIAGNOSIS — R410 Disorientation, unspecified: Secondary | ICD-10-CM | POA: Diagnosis not present

## 2016-01-01 DIAGNOSIS — Z955 Presence of coronary angioplasty implant and graft: Secondary | ICD-10-CM | POA: Diagnosis not present

## 2016-01-01 DIAGNOSIS — K8020 Calculus of gallbladder without cholecystitis without obstruction: Secondary | ICD-10-CM | POA: Diagnosis not present

## 2016-01-01 DIAGNOSIS — J438 Other emphysema: Secondary | ICD-10-CM | POA: Diagnosis not present

## 2016-01-01 DIAGNOSIS — N179 Acute kidney failure, unspecified: Secondary | ICD-10-CM | POA: Diagnosis present

## 2016-01-01 DIAGNOSIS — D72829 Elevated white blood cell count, unspecified: Secondary | ICD-10-CM | POA: Diagnosis not present

## 2016-01-01 DIAGNOSIS — Z66 Do not resuscitate: Secondary | ICD-10-CM | POA: Diagnosis present

## 2016-01-01 DIAGNOSIS — Z8711 Personal history of peptic ulcer disease: Secondary | ICD-10-CM | POA: Diagnosis not present

## 2016-01-01 DIAGNOSIS — N401 Enlarged prostate with lower urinary tract symptoms: Secondary | ICD-10-CM | POA: Diagnosis not present

## 2016-01-01 DIAGNOSIS — I639 Cerebral infarction, unspecified: Secondary | ICD-10-CM | POA: Diagnosis not present

## 2016-01-01 DIAGNOSIS — R310 Gross hematuria: Secondary | ICD-10-CM | POA: Diagnosis not present

## 2016-01-01 DIAGNOSIS — J410 Simple chronic bronchitis: Secondary | ICD-10-CM | POA: Diagnosis not present

## 2016-01-01 DIAGNOSIS — E870 Hyperosmolality and hypernatremia: Secondary | ICD-10-CM | POA: Diagnosis not present

## 2016-01-01 DIAGNOSIS — R338 Other retention of urine: Secondary | ICD-10-CM | POA: Diagnosis not present

## 2016-01-01 DIAGNOSIS — F0390 Unspecified dementia without behavioral disturbance: Secondary | ICD-10-CM | POA: Diagnosis present

## 2016-01-01 DIAGNOSIS — R400 Somnolence: Secondary | ICD-10-CM | POA: Diagnosis not present

## 2016-01-01 HISTORY — PX: CARDIAC CATHETERIZATION: SHX172

## 2016-01-01 HISTORY — DX: Solitary pulmonary nodule: R91.1

## 2016-01-01 HISTORY — DX: Chronic obstructive pulmonary disease, unspecified: J44.9

## 2016-01-01 LAB — COMPREHENSIVE METABOLIC PANEL
ALK PHOS: 56 U/L (ref 38–126)
ALT: 14 U/L — AB (ref 17–63)
ANION GAP: 6 (ref 5–15)
AST: 15 U/L (ref 15–41)
Albumin: 3.5 g/dL (ref 3.5–5.0)
BILIRUBIN TOTAL: 0.7 mg/dL (ref 0.3–1.2)
BUN: 23 mg/dL — ABNORMAL HIGH (ref 6–20)
CALCIUM: 9.2 mg/dL (ref 8.9–10.3)
CO2: 30 mmol/L (ref 22–32)
Chloride: 104 mmol/L (ref 101–111)
Creatinine, Ser: 1.14 mg/dL (ref 0.61–1.24)
Glucose, Bld: 106 mg/dL — ABNORMAL HIGH (ref 65–99)
Potassium: 3.5 mmol/L (ref 3.5–5.1)
Sodium: 140 mmol/L (ref 135–145)
Total Protein: 6.1 g/dL — ABNORMAL LOW (ref 6.5–8.1)

## 2016-01-01 LAB — CBC
HCT: 44 % (ref 39.0–52.0)
HEMOGLOBIN: 14.5 g/dL (ref 13.0–17.0)
MCH: 29.7 pg (ref 26.0–34.0)
MCHC: 33 g/dL (ref 30.0–36.0)
MCV: 90.2 fL (ref 78.0–100.0)
Platelets: 220 10*3/uL (ref 150–400)
RBC: 4.88 MIL/uL (ref 4.22–5.81)
RDW: 13.5 % (ref 11.5–15.5)
WBC: 11 10*3/uL — ABNORMAL HIGH (ref 4.0–10.5)

## 2016-01-01 LAB — TROPONIN I
TROPONIN I: 3.19 ng/mL — AB (ref <0.031)
Troponin I: 0.12 ng/mL — ABNORMAL HIGH (ref <0.031)
Troponin I: 2.79 ng/mL (ref <0.031)
Troponin I: 2.29 ng/mL (ref <0.031)

## 2016-01-01 LAB — ECHOCARDIOGRAM COMPLETE
HEIGHTINCHES: 68 in
WEIGHTICAEL: 3089.97 [oz_av]

## 2016-01-01 LAB — TSH: TSH: 1.717 u[IU]/mL (ref 0.350–4.500)

## 2016-01-01 LAB — PROTIME-INR
INR: 1.08 (ref 0.00–1.49)
Prothrombin Time: 14.2 seconds (ref 11.6–15.2)

## 2016-01-01 LAB — MRSA PCR SCREENING: MRSA by PCR: NEGATIVE

## 2016-01-01 LAB — STREP PNEUMONIAE URINARY ANTIGEN: STREP PNEUMO URINARY ANTIGEN: NEGATIVE

## 2016-01-01 LAB — BRAIN NATRIURETIC PEPTIDE: B Natriuretic Peptide: 36.1 pg/mL (ref 0.0–100.0)

## 2016-01-01 SURGERY — LEFT HEART CATH AND CORONARY ANGIOGRAPHY
Laterality: Right

## 2016-01-01 MED ORDER — VERAPAMIL HCL 2.5 MG/ML IV SOLN
INTRAVENOUS | Status: DC | PRN
  Administered 2016-01-01 – 2016-01-02 (×2): 10 mL via INTRA_ARTERIAL

## 2016-01-01 MED ORDER — SODIUM CHLORIDE 0.9% FLUSH
3.0000 mL | INTRAVENOUS | Status: DC | PRN
Start: 2016-01-01 — End: 2016-01-02

## 2016-01-01 MED ORDER — ATORVASTATIN CALCIUM 80 MG PO TABS
80.0000 mg | ORAL_TABLET | Freq: Every day | ORAL | Status: DC
  Administered 2016-01-03 – 2016-01-10 (×7): 80 mg via ORAL
  Filled 2016-01-01 (×2): qty 1
  Filled 2016-01-01: qty 2
  Filled 2016-01-01 (×7): qty 1

## 2016-01-01 MED ORDER — SODIUM CHLORIDE 0.9 % IV BOLUS (SEPSIS)
500.0000 mL | Freq: Once | INTRAVENOUS | Status: AC
  Administered 2016-01-01: 500 mL via INTRAVENOUS

## 2016-01-01 MED ORDER — TIOTROPIUM BROMIDE MONOHYDRATE 18 MCG IN CAPS
1.0000 | ORAL_CAPSULE | RESPIRATORY_TRACT | Status: DC
  Filled 2016-01-01: qty 5

## 2016-01-01 MED ORDER — HEPARIN (PORCINE) IN NACL 100-0.45 UNIT/ML-% IJ SOLN
1000.0000 [IU]/h | INTRAMUSCULAR | Status: DC
  Administered 2016-01-01: 1000 [IU]/h via INTRAVENOUS
  Filled 2016-01-01: qty 250

## 2016-01-01 MED ORDER — DEXTROSE 5 % IV SOLN
INTRAVENOUS | Status: AC
  Filled 2016-01-01: qty 10

## 2016-01-01 MED ORDER — MIDAZOLAM HCL 2 MG/2ML IJ SOLN
INTRAMUSCULAR | Status: DC | PRN
  Administered 2016-01-01: 2 mg via INTRAVENOUS
  Administered 2016-01-02: 1 mg via INTRAVENOUS

## 2016-01-01 MED ORDER — CLOPIDOGREL BISULFATE 75 MG PO TABS
75.0000 mg | ORAL_TABLET | Freq: Every day | ORAL | Status: DC
  Filled 2016-01-01: qty 1

## 2016-01-01 MED ORDER — SODIUM CHLORIDE 0.9% FLUSH
3.0000 mL | Freq: Two times a day (BID) | INTRAVENOUS | Status: DC
  Administered 2016-01-02 – 2016-01-11 (×11): 3 mL via INTRAVENOUS

## 2016-01-01 MED ORDER — CLONIDINE HCL 0.1 MG PO TABS
0.1000 mg | ORAL_TABLET | Freq: Every day | ORAL | Status: DC
  Administered 2016-01-01: 0.1 mg via ORAL
  Filled 2016-01-01: qty 1

## 2016-01-01 MED ORDER — TAMSULOSIN HCL 0.4 MG PO CAPS
0.8000 mg | ORAL_CAPSULE | Freq: Every day | ORAL | Status: DC
  Administered 2016-01-01 – 2016-01-11 (×11): 0.8 mg via ORAL
  Filled 2016-01-01 (×11): qty 2

## 2016-01-01 MED ORDER — MORPHINE SULFATE (PF) 2 MG/ML IV SOLN
2.0000 mg | INTRAVENOUS | Status: DC | PRN
  Administered 2016-01-01: 2 mg via INTRAVENOUS
  Filled 2016-01-01: qty 1

## 2016-01-01 MED ORDER — ASPIRIN 325 MG PO TABS
325.0000 mg | ORAL_TABLET | Freq: Every day | ORAL | Status: DC
  Administered 2016-01-01: 325 mg via ORAL
  Filled 2016-01-01: qty 1

## 2016-01-01 MED ORDER — FENTANYL CITRATE (PF) 100 MCG/2ML IJ SOLN
INTRAMUSCULAR | Status: DC | PRN
  Administered 2016-01-01: 50 ug via INTRAVENOUS
  Administered 2016-01-02: 25 ug via INTRAVENOUS

## 2016-01-01 MED ORDER — NITROGLYCERIN 0.4 MG SL SUBL
SUBLINGUAL_TABLET | SUBLINGUAL | Status: AC
  Filled 2016-01-01: qty 1

## 2016-01-01 MED ORDER — DEXTROSE 5 % IV SOLN
500.0000 mg | INTRAVENOUS | Status: DC
  Administered 2016-01-02 – 2016-01-07 (×6): 500 mg via INTRAVENOUS
  Filled 2016-01-01 (×9): qty 500

## 2016-01-01 MED ORDER — IPRATROPIUM-ALBUTEROL 0.5-2.5 (3) MG/3ML IN SOLN
3.0000 mL | Freq: Four times a day (QID) | RESPIRATORY_TRACT | Status: DC
  Administered 2016-01-01: 3 mL via RESPIRATORY_TRACT
  Filled 2016-01-01: qty 3

## 2016-01-01 MED ORDER — SODIUM CHLORIDE 0.9 % IV SOLN
INTRAVENOUS | Status: DC
  Administered 2016-01-01: 04:00:00 via INTRAVENOUS

## 2016-01-01 MED ORDER — ALBUTEROL SULFATE (2.5 MG/3ML) 0.083% IN NEBU: 2.5000 mg | INHALATION_SOLUTION | Freq: Four times a day (QID) | RESPIRATORY_TRACT | Status: DC

## 2016-01-01 MED ORDER — DEXTROSE 5 % IV SOLN
1.0000 g | INTRAVENOUS | Status: DC
  Administered 2016-01-02 – 2016-01-05 (×4): 1 g via INTRAVENOUS
  Filled 2016-01-01 (×6): qty 10

## 2016-01-01 MED ORDER — HEPARIN BOLUS VIA INFUSION
4000.0000 [IU] | Freq: Once | INTRAVENOUS | Status: AC
  Administered 2016-01-01: 4000 [IU] via INTRAVENOUS
  Filled 2016-01-01: qty 4000

## 2016-01-01 MED ORDER — DEXTROSE 5 % IV SOLN
1.0000 g | Freq: Once | INTRAVENOUS | Status: AC
  Administered 2016-01-01: 1 g via INTRAVENOUS
  Filled 2016-01-01: qty 10

## 2016-01-01 MED ORDER — NITROGLYCERIN 1 MG/10 ML FOR IR/CATH LAB
INTRA_ARTERIAL | Status: AC
  Filled 2016-01-01: qty 10

## 2016-01-01 MED ORDER — HYDROCHLOROTHIAZIDE 25 MG PO TABS: 25.0000 mg | ORAL_TABLET | Freq: Every day | ORAL | Status: DC

## 2016-01-01 MED ORDER — LIDOCAINE HCL (PF) 1 % IJ SOLN
INTRAMUSCULAR | Status: DC | PRN
  Administered 2016-01-01: 2 mL via INTRADERMAL

## 2016-01-01 MED ORDER — NITROGLYCERIN 0.4 MG SL SUBL
0.4000 mg | SUBLINGUAL_TABLET | SUBLINGUAL | Status: DC | PRN
  Administered 2016-01-01: 0.4 mg via SUBLINGUAL

## 2016-01-01 MED ORDER — ENOXAPARIN SODIUM 40 MG/0.4ML ~~LOC~~ SOLN
40.0000 mg | SUBCUTANEOUS | Status: DC
  Administered 2016-01-01: 40 mg via SUBCUTANEOUS
  Filled 2016-01-01: qty 0.4

## 2016-01-01 MED ORDER — ENALAPRIL MALEATE 5 MG PO TABS
20.0000 mg | ORAL_TABLET | Freq: Every day | ORAL | Status: DC
Start: 2016-01-01 — End: 2016-01-01
  Administered 2016-01-01: 20 mg via ORAL
  Filled 2016-01-01: qty 4

## 2016-01-01 MED ORDER — SODIUM CHLORIDE 0.9% FLUSH
3.0000 mL | Freq: Two times a day (BID) | INTRAVENOUS | Status: DC
  Administered 2016-01-01 – 2016-01-11 (×10): 3 mL via INTRAVENOUS

## 2016-01-01 MED ORDER — ALBUTEROL SULFATE (2.5 MG/3ML) 0.083% IN NEBU: 2.5000 mg | INHALATION_SOLUTION | RESPIRATORY_TRACT | Status: DC | PRN

## 2016-01-01 MED ORDER — ACETAMINOPHEN 325 MG PO TABS
650.0000 mg | ORAL_TABLET | Freq: Four times a day (QID) | ORAL | Status: DC | PRN
  Administered 2016-01-03 – 2016-01-05 (×2): 650 mg via ORAL
  Filled 2016-01-01 (×2): qty 2

## 2016-01-01 MED ORDER — POTASSIUM CHLORIDE CRYS ER 20 MEQ PO TBCR
40.0000 meq | EXTENDED_RELEASE_TABLET | Freq: Once | ORAL | Status: AC
  Administered 2016-01-01: 40 meq via ORAL
  Filled 2016-01-01: qty 2

## 2016-01-01 MED ORDER — FINASTERIDE 5 MG PO TABS
5.0000 mg | ORAL_TABLET | Freq: Every day | ORAL | Status: DC
  Administered 2016-01-01 – 2016-01-11 (×11): 5 mg via ORAL
  Filled 2016-01-01 (×13): qty 1

## 2016-01-01 MED ORDER — MIDAZOLAM HCL 2 MG/2ML IJ SOLN
INTRAMUSCULAR | Status: AC
  Filled 2016-01-01: qty 2

## 2016-01-01 MED ORDER — METOPROLOL TARTRATE 25 MG PO TABS
12.5000 mg | ORAL_TABLET | Freq: Two times a day (BID) | ORAL | Status: DC
  Administered 2016-01-01: 12.5 mg via ORAL
  Filled 2016-01-01: qty 1

## 2016-01-01 MED ORDER — SODIUM CHLORIDE 0.9 % IV SOLN: INTRAVENOUS | Status: DC

## 2016-01-01 MED ORDER — RIVASTIGMINE 4.6 MG/24HR TD PT24
4.6000 mg | MEDICATED_PATCH | Freq: Every day | TRANSDERMAL | Status: DC
  Administered 2016-01-02 – 2016-01-03 (×2): 4.6 mg via TRANSDERMAL
  Filled 2016-01-01 (×5): qty 1

## 2016-01-01 MED ORDER — LIDOCAINE HCL (PF) 1 % IJ SOLN
INTRAMUSCULAR | Status: AC
  Filled 2016-01-01: qty 30

## 2016-01-01 MED ORDER — VERAPAMIL HCL 2.5 MG/ML IV SOLN
INTRAVENOUS | Status: AC
  Filled 2016-01-01: qty 2

## 2016-01-01 MED ORDER — SODIUM CHLORIDE 0.9 % IV SOLN: 250.0000 mL | INTRAVENOUS | Status: DC | PRN

## 2016-01-01 MED ORDER — NITROGLYCERIN IN D5W 200-5 MCG/ML-% IV SOLN
INTRAVENOUS | Status: AC
  Administered 2016-01-01: 50000 ug
  Filled 2016-01-01: qty 250

## 2016-01-01 MED ORDER — LOSARTAN POTASSIUM 50 MG PO TABS
50.0000 mg | ORAL_TABLET | Freq: Every day | ORAL | Status: DC
  Administered 2016-01-02 – 2016-01-05 (×4): 50 mg via ORAL
  Filled 2016-01-01 (×4): qty 1

## 2016-01-01 MED ORDER — SODIUM CHLORIDE 0.9% FLUSH: 3.0000 mL | Freq: Two times a day (BID) | INTRAVENOUS | Status: DC

## 2016-01-01 MED ORDER — FENTANYL CITRATE (PF) 100 MCG/2ML IJ SOLN
INTRAMUSCULAR | Status: AC
  Filled 2016-01-01: qty 2

## 2016-01-01 MED ORDER — ASPIRIN 81 MG PO CHEW: 81.0000 mg | CHEWABLE_TABLET | ORAL | Status: DC

## 2016-01-01 MED ORDER — IPRATROPIUM BROMIDE 0.02 % IN SOLN: 0.5000 mg | Freq: Four times a day (QID) | RESPIRATORY_TRACT | Status: DC

## 2016-01-01 MED ORDER — LOSARTAN POTASSIUM-HCTZ 50-12.5 MG PO TABS: 1.0000 | ORAL_TABLET | Freq: Every day | ORAL | Status: DC

## 2016-01-01 MED ORDER — DEXTROSE 5 % IV SOLN
500.0000 mg | Freq: Once | INTRAVENOUS | Status: AC
  Administered 2016-01-01: 500 mg via INTRAVENOUS
  Filled 2016-01-01: qty 500

## 2016-01-01 MED ORDER — GUAIFENESIN ER 600 MG PO TB12
600.0000 mg | ORAL_TABLET | Freq: Two times a day (BID) | ORAL | Status: DC
  Administered 2016-01-01 – 2016-01-09 (×15): 600 mg via ORAL
  Filled 2016-01-01 (×19): qty 1

## 2016-01-01 MED ORDER — HYDROCHLOROTHIAZIDE 12.5 MG PO CAPS: 12.5000 mg | ORAL_CAPSULE | Freq: Every day | ORAL | Status: DC

## 2016-01-01 MED ORDER — IPRATROPIUM-ALBUTEROL 0.5-2.5 (3) MG/3ML IN SOLN
3.0000 mL | Freq: Four times a day (QID) | RESPIRATORY_TRACT | Status: DC
  Administered 2016-01-01 – 2016-01-02 (×6): 3 mL via RESPIRATORY_TRACT
  Filled 2016-01-01 (×6): qty 3

## 2016-01-01 MED ORDER — HEPARIN (PORCINE) IN NACL 2-0.9 UNIT/ML-% IJ SOLN
INTRAMUSCULAR | Status: DC | PRN
  Administered 2016-01-01: 1000 mL

## 2016-01-01 MED ORDER — SODIUM CHLORIDE 0.9 % IV SOLN
INTRAVENOUS | Status: DC
  Administered 2016-01-02: 10 mL via INTRAVENOUS

## 2016-01-01 MED ORDER — HEPARIN (PORCINE) IN NACL 2-0.9 UNIT/ML-% IJ SOLN
INTRAMUSCULAR | Status: AC
  Filled 2016-01-01: qty 1000

## 2016-01-01 MED ORDER — ACETAMINOPHEN 650 MG RE SUPP: 650.0000 mg | Freq: Four times a day (QID) | RECTAL | Status: DC | PRN

## 2016-01-01 SURGICAL SUPPLY — 24 items
BALLN EMERGE MR 2.5X15 (BALLOONS) ×4
BALLN ~~LOC~~ EMERGE MR 3.5X15 (BALLOONS) ×4
BALLN ~~LOC~~ EMERGE MR 4.0X15 (BALLOONS) ×4
BALLOON EMERGE MR 2.5X15 (BALLOONS) ×1 IMPLANT
BALLOON ~~LOC~~ EMERGE MR 3.5X15 (BALLOONS) IMPLANT
BALLOON ~~LOC~~ EMERGE MR 4.0X15 (BALLOONS) ×1 IMPLANT
CATH HEARTRAIL IKARI 6F IR1.0 (CATHETERS) ×2 IMPLANT
CATH INFINITI 5 FR JL3.5 (CATHETERS) ×4 IMPLANT
CATH INFINITI JR4 5F (CATHETERS) ×2 IMPLANT
DEVICE RAD COMP TR BAND LRG (VASCULAR PRODUCTS) ×4 IMPLANT
GLIDESHEATH SLEND A-KIT 6F 22G (SHEATH) ×3 IMPLANT
KIT ENCORE 26 ADVANTAGE (KITS) ×3 IMPLANT
KIT HEART LEFT (KITS) ×4 IMPLANT
PACK CARDIAC CATHETERIZATION (CUSTOM PROCEDURE TRAY) ×4 IMPLANT
STENT SYNERGY DES 3.5X38 (Permanent Stent) ×3 IMPLANT
STENT SYNERGY DES 3X28 (Permanent Stent) ×4 IMPLANT
SYR MEDRAD MARK V 150ML (SYRINGE) ×4 IMPLANT
TRANSDUCER W/STOPCOCK (MISCELLANEOUS) ×4 IMPLANT
TUBING CIL FLEX 10 FLL-RA (TUBING) ×4 IMPLANT
WIRE ASAHI PROWATER 180CM (WIRE) ×2 IMPLANT
WIRE HI TORQ VERSACORE-J 145CM (WIRE) ×2 IMPLANT
WIRE LUGE 182CM (WIRE) ×3 IMPLANT
WIRE RUNTHROUGH .014X180CM (WIRE) ×2 IMPLANT
WIRE SAFE-T 1.5MM-J .035X260CM (WIRE) ×3 IMPLANT

## 2016-01-01 NOTE — Progress Notes (Signed)
[facility] PROGRESS NOTE  Trevonne Nyland Cregg ZOX:[id number] DOB: [birth year] DOA: 12/31/2015 PCP: [name], MD  Assessment/Plan: 1. NSTEMI. Patient initially presented to the hospital with substernal chest pain that radiated through to his back. He had associated nausea and shortness of breath. Symptoms began after patient went dancing. EKG has been unrevealing. He has ruled in for ACS with troponin of 2.79. Case discussed with Dr. Elease Hashimoto at [facility] who agrees with transfer for possible cardiac catheterization. He will be started on heparin infusion, low dose lopressor, high dose statin and aspirin.  If patient has recurrence of chest pain, can consider starting on nitroglycerin infusion. CT revealed normal caliber thoracoabdominal aorta without dissection or acute aortic abnormality.  2. Possible CAP vs. bronchitis. CT chest showed faint reticulonodular opacity in the subpleural RLL, likely infectious or inflammatory. He has been started on antibiotics but is not febrile and does not appear to be behaving like a pneumonia. Would have low threshold to discontinue abx. Follow blood Cx, strep pneumo antigen. 3. Hypokalemia. Replaced. Improved 4. COPD. Pt had mild wheeze on admission which has resolved with medications 5. HTN. Continue on current meds. 6. Lung nodule. CT chest showed [id number] cm nodule in the apical right lung. Patient will need referral to [facility] on discharge and will likely need outpatient PET-CT.  Code Status: DNR DVT prophylaxis: heparin infusion Family Communication: discussed with patient and family at the bedside Disposition Plan: transfer to [facility] for further management   Consultants:  none  Procedures:  none  Antibiotics:  Zithromax 3/10 >>  Ceftriaxone 3/10 >>  HPI/Subjective: No further chest pain. Wheezing has improved. No cough  Objective: Filed Vitals:   01/01/16 0246 01/01/16 0300  BP:  173/76  Pulse:  64  Temp:  96.8 F (36 C)   Resp:  17    Intake/Output Summary (Last 24 hours) at 01/01/16 0603 Last data filed at 01/01/16 0300  Gross per 24 hour  Intake      3 ml  Output      0 ml  Net      3 ml   Filed Weights   12/31/15 2210 01/01/16 0246  Weight: 87.091 kg (192 lb) 87.6 kg (193 lb 2 oz)    Exam:  General: NAD, looks comfortable Cardiovascular: RRR, S1, S2  Respiratory: clear bilaterally, No wheezing, rales or rhonchi Abdomen: soft, non tender, no distention , bowel sounds normal Musculoskeletal: No edema b/l  Data Reviewed: Basic Metabolic Panel:  Recent Labs Lab 12/31/15 2227 01/01/16 0449  NA 139 140  K 3.3* 3.5  CL 104 104  CO2 29 30  GLUCOSE 183* 106*  BUN 25* 23*  CREATININE 1.35* 1.14  CALCIUM 9.4 9.2   Liver Function Tests:  Recent Labs Lab 12/31/15 2227 01/01/16 0449  AST 18 15  ALT 17 14*  ALKPHOS 62 56  BILITOT 0.5 0.7  PROT 6.8 6.1*  ALBUMIN 3.9 3.5   No results for input(s): LIPASE, AMYLASE in the last 168 hours. No results for input(s): AMMONIA in the last 168 hours. CBC:  Recent Labs Lab 12/31/15 2227 01/01/16 0449  WBC 13.7* 11.0*  NEUTROABS 10.3*  --   HGB 16.0 14.5  HCT 47.0 44.0  MCV 89.5 90.2  PLT 216 220   Cardiac Enzymes:  Recent Labs Lab 12/31/15 2227 01/01/16 0125  TROPONINI <0.03 0.12*   BNP (last 3 results) No results for input(s): BNP in the last 8760 hours.  ProBNP (last 3  results) No results for input(s): PROBNP in the last 8760 hours.  CBG: No results for input(s): GLUCAP in the last 168 hours.  Recent Results (from the past 240 hour(s))  Blood culture (routine x 2)     Status: None (Preliminary result)   Collection Time: 01/01/16  1:14 AM  Result Value Ref Range Status   Specimen Description RIGHT ANTECUBITAL  Final   Special Requests BOTTLES DRAWN AEROBIC ONLY 6CC  Final   Culture PENDING  Incomplete   Report Status PENDING  Incomplete  Blood culture (routine x 2)     Status: None (Preliminary  result)   Collection Time: 01/01/16  1:25 AM  Result Value Ref Range Status   Specimen Description LEFT ANTECUBITAL  Final   Special Requests BOTTLES DRAWN AEROBIC AND ANAEROBIC 6CC  Final   Culture PENDING  Incomplete   Report Status PENDING  Incomplete     Studies: Ct Angio Chest Aorta W/cm &/or Wo/cm  01/01/2016  CLINICAL DATA:  Sudden onset of central chest pain radiating to the back. Rule out dissection. EXAM: CT ANGIOGRAPHY CHEST, ABDOMEN AND PELVIS TECHNIQUE: Multidetector CT imaging through the chest, abdomen and pelvis was performed using the standard protocol during bolus administration of intravenous contrast. Multiplanar reconstructed images and MIPs were obtained and reviewed to evaluate the vascular anatomy. CONTRAST:  100mL OMNIPAQUE IOHEXOL 350 MG/ML SOLN COMPARISON:  None. FINDINGS: CTA CHEST FINDINGS Normal caliber thoracic aorta without dissection, aneurysm, hematoma or acute aortic syndrome. Mild atherosclerosis. Conventional branching pattern from the aortic arch. No filling defects in the pulmonary arteries to the segmental level. Heart is normal in size. Coronary artery calcification versus stent. Scattered normal size mediastinal nodes, no pathologic adenopathy. No hilar adenopathy. Lobular right apical nodule measures 1.5 x 1.3 x 2.2 cm with minimal surrounding spiculation. No additional suspicious pulmonary nodule. Faint reticular nodular subpleural opacity in the anterior right upper lobe likely infectious or inflammatory. No consolidation or pulmonary edema. Sub cm lucency within T4 vertebral body, nonspecific. No sclerotic lesions. No acute osseous abnormality. Review of the MIP images confirms the above findings. CTA ABDOMEN AND PELVIS FINDINGS Normal caliber abdominal aorta without aneurysm, dissection, hematoma or acute aortic abnormality. Moderate diffuse atherosclerosis. Celiac, superior mesenteric, and inferior mesenteric arteries are patent. Single bilateral renal  arteries are patent. Atherosclerosis of both iliac arteries without significant stenosis. Arterial phase imaging of the liver, spleen, and pancreas are normal. No adrenal nodule. Symmetric renal enhancement. Exophytic lower pole cyst on the left measures 2.1 cm. Stomach distended with ingested contents. There are no dilated or thickened bowel loops. Distal small bowel loops are fluid-filled. Colonic tortuosity with small to moderate stool burden. No colonic wall thickening. Small retroperitoneal lymph nodes, not enlarged by size criteria. No pelvic adenopathy. Enlarged prostate gland measuring 6.6 cm transverse with mass effect on the bladder base. Bladder physiologically distended. There is fat in the right inguinal canal. There are no acute or suspicious osseous abnormalities. Review of the MIP images confirms the above findings. IMPRESSION: 1. Normal caliber thoracoabdominal aorta without dissection or acute aortic abnormality. Moderate atherosclerosis. 2. Lobular right apical pulmonary nodule measures 2.2 x 1.5 x 1.3 cm with minimal spiculation, concerning for primary bronchogenic neoplasm. There is no adenopathy. Other than a subcentimeter lucency in T4 which is nonspecific, no evidence of metastatic disease on this arterial phase imaging exams. This bone lucency may simply represent normal variation. Recommend PET-CT characterization. 3. Faint reticulonodular opacity in the subpleural right lower lobe is likely infectious or  inflammatory. 4. No acute abnormality in the abdomen/pelvis. Prostatomegaly is incidentally noted. Electronically Signed   By: Rubye Oaks M.D.   On: 01/01/2016 00:47   Ct Cta Abd/pel W/cm &/or W/o Cm  01/01/2016  CLINICAL DATA:  Sudden onset of central chest pain radiating to the back. Rule out dissection. EXAM: CT ANGIOGRAPHY CHEST, ABDOMEN AND PELVIS TECHNIQUE: Multidetector CT imaging through the chest, abdomen and pelvis was performed using the standard protocol during bolus  administration of intravenous contrast. Multiplanar reconstructed images and MIPs were obtained and reviewed to evaluate the vascular anatomy. CONTRAST:  OMNIPAQUE IOHEXOL 350 MG/ML SOLN COMPARISON:  None. FINDINGS: CTA CHEST FINDINGS Normal caliber thoracic aorta without dissection, aneurysm, hematoma or acute aortic syndrome. Mild atherosclerosis. Conventional branching pattern from the aortic arch. No filling defects in the pulmonary arteries to the segmental level. Heart is normal in size. Coronary artery calcification versus stent. Scattered normal size mediastinal nodes, no pathologic adenopathy. No hilar adenopathy. Lobular right apical nodule measures 1.5 x 1.3 x 2.2 cm with minimal surrounding spiculation. No additional suspicious pulmonary nodule. Faint reticular nodular subpleural opacity in the anterior right upper lobe likely infectious or inflammatory. No consolidation or pulmonary edema. Sub cm lucency within T4 vertebral body, nonspecific. No sclerotic lesions. No acute osseous abnormality. Review of the MIP images confirms the above findings. CTA ABDOMEN AND PELVIS FINDINGS Normal caliber abdominal aorta without aneurysm, dissection, hematoma or acute aortic abnormality. Moderate diffuse atherosclerosis. Celiac, superior mesenteric, and inferior mesenteric arteries are patent. Single bilateral renal arteries are patent. Atherosclerosis of both iliac arteries without significant stenosis. Arterial phase imaging of the liver, spleen, and pancreas are normal. No adrenal nodule. Symmetric renal enhancement. Exophytic lower pole cyst on the left measures 2.1 cm. Stomach distended with ingested contents. There are no dilated or thickened bowel loops. Distal small bowel loops are fluid-filled. Colonic tortuosity with small to moderate stool burden. No colonic wall thickening. Small retroperitoneal lymph nodes, not enlarged by size criteria. No pelvic adenopathy. Enlarged prostate gland measuring 6.6  cm transverse with mass effect on the bladder base. Bladder physiologically distended. There is fat in the right inguinal canal. There are no acute or suspicious osseous abnormalities. Review of the MIP images confirms the above findings. IMPRESSION: 1. Normal caliber thoracoabdominal aorta without dissection or acute aortic abnormality. Moderate atherosclerosis. 2. Lobular right apical pulmonary nodule measures 2.2 x 1.5 x 1.3 cm with minimal spiculation, concerning for primary bronchogenic neoplasm. There is no adenopathy. Other than a subcentimeter lucency in T4 which is nonspecific, no evidence of metastatic disease on this arterial phase imaging exams. This bone lucency may simply represent normal variation. Recommend PET-CT characterization. 3. Faint reticulonodular opacity in the subpleural right lower lobe is likely infectious or inflammatory. 4. No acute abnormality in the abdomen/pelvis. Prostatomegaly is incidentally noted. Electronically Signed   By: Rubye Oaks M.D.   On: 01/01/2016 00:47    Scheduled Meds: . [START ON 01/02/2016] azithromycin  500 mg Intravenous Q24H  . cloNIDine  0.1 mg Oral Daily  . enalapril  20 mg Oral Daily  . enoxaparin (LOVENOX) injection  40 mg Subcutaneous Q24H  . finasteride  5 mg Oral BID  . guaiFENesin  600 mg Oral BID  . ipratropium-albuterol  3 mL Nebulization QID  . sodium chloride flush  3 mL Intravenous Q12H  . tamsulosin  0.4 mg Oral BID   Continuous Infusions: . sodium chloride 10 mL/hr at 01/01/16 1610    Active Problems:   Chest  pain   CAP (community acquired pneumonia)   COPD (chronic obstructive pulmonary disease) (HCC)   Lung nodule    Time spent: 25 minutes    Erick Blinks, MD. [facility] Pager [phone]. If 7PM-7AM, please contact night-coverage at [email or web address], password [facility] 01/01/2016, 6:03 AM

## 2016-01-01 NOTE — Progress Notes (Signed)
Pharmacy Antibiotic Note  Lee Chang is a 75 y.o. male admitted on 12/31/2015 with pneumonia.  Pharmacy has been consulted for ceftriaxone dosing. Presents with chest pain, coughing for 3 weeks, productive gray colored phlegm. No fever or chills, CT chest show right apical pulmonary nodule. Empiric tx for CAP  Plan: Rocephin 1gm IV every 24 hours F/u cultures  Height: 5\' 8"  (172.7 cm) Weight: 193 lb 2 oz (87.6 kg) IBW/kg (Calculated) : 68.4  Temp (24hrs), Avg:97.3 F (36.3 C), Min:96.8 F (36 C), Max:97.7 F (36.5 C)   Recent Labs Lab 12/31/15 2227 01/01/16 0449  WBC 13.7* 11.0*  CREATININE 1.35* 1.14    Estimated Creatinine Clearance: 60.3 mL/min (by C-G formula based on Cr of 1.14).    Allergies  Allergen Reactions  . Asa [Aspirin]     Bleeding ulcer    Antimicrobials this admission: Ceftriaxone 3/11 >>  Azithromycin 3/11 >>     Microbiology results: 3/11  BCx: pending 3/11 MRSA PCR: pending  Thank you for allowing pharmacy to be a part of this patient's care.  Elder CyphersLorie Ajai Terhaar, BS Pharm D, [place] Clinical Pharmacist Pager [phone] 01/01/2016 7:51 AM

## 2016-01-01 NOTE — Progress Notes (Signed)
ANTICOAGULATION CONSULT NOTE - Initial Consult  Pharmacy Consult for Heparin Indication: chest pain/ACS  Allergies  Allergen Reactions  . Asa [Aspirin]     Bleeding ulcer    Patient Measurements: Height: 5\' 8"  (172.7 cm) Weight: 193 lb 2 oz (87.6 kg) IBW/kg (Calculated) : 68.4 HEPARIN DW (KG): 86.1  Vital Signs: Temp: 96.8 F (36 C) (03/11 0814) Temp Source: Oral (03/11 0814) BP: 142/76 mmHg (03/11 0600) Pulse Rate: 56 (03/11 0600)  Labs:  Recent Labs  12/31/15 2227 01/01/16 0125 01/01/16 0449 01/01/16 0857  HGB 16.0  --  14.5  --   HCT 47.0  --  44.0  --   PLT 216  --  220  --   CREATININE 1.35*  --  1.14  --   TROPONINI <0.03 0.12*  --  2.79*    Estimated Creatinine Clearance: 60.3 mL/min (by C-G formula based on Cr of 1.14).   Medical History: Past Medical History  Diagnosis Date  . Ulcer   . Asthma   . Hypertension     Medications:  Prescriptions prior to admission  Medication Sig Dispense Refill Last Dose  . finasteride (PROSCAR) 5 MG tablet Take 5 mg by mouth daily.    12/31/2015 at 0800  . losartan-hydrochlorothiazide (HYZAAR) 50-12.5 MG tablet Take 1 tablet by mouth daily.   12/31/2015 at Unknown time  . rivastigmine (EXELON) 4.6 mg/24hr Place 1 patch onto the skin daily.   12/31/2015 at Unknown time  . SPIRIVA HANDIHALER 18 MCG inhalation capsule Place 1 capsule into inhaler and inhale daily.   12/31/2015 at Unknown time  . tamsulosin (FLOMAX) 0.4 MG CAPS capsule Take 0.8 mg by mouth daily.    12/31/2015 at 0800  . VENTOLIN HFA 108 (90 BASE) MCG/ACT inhaler Inhale 2 puffs into the lungs every 6 (six) hours as needed for wheezing or shortness of breath.    12/31/2015 at 1800    Assessment: 75 yo male presented with chest pain radiating through to his back and coughing for 3 weeks. Hi is SOB and nauseated. Troponins elevate  2.79. Start heparin for ACS.   Goal of Therapy:  Heparin level 0.3-0.7 units/ml Monitor platelets by anticoagulation protocol:  Yes   Plan:  Give 4000 units bolus x 1 Start heparin infusion at 1000 units/hr Check anti-Xa level in 8 hours and daily while on heparin Continue to monitor H&H and platelets  Dutch QuintPoole, Yanique Mulvihill L 01/01/2016,11:01 AM

## 2016-01-01 NOTE — Interval H&P Note (Signed)
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 After consideration of risks, benefits and other options for treatment, the patient has consented to  Procedure(s): Left Heart Cath and Coronary Angiography (N/A) as a surgical intervention .  The patient's history has been reviewed, patient examined, no change in status, stable for surgery.  I have reviewed the patient's chart and labs.  Questions were answered to the patient's satisfaction.     HARDING, DAVID W

## 2016-01-01 NOTE — H&P (Addendum)
CARDIOLOGY INPATIENT HISTORY AND PHYSICAL EXAMINATION NOTE  Patient ID: Lee Chang MRN: [id number], DOB/AGE: 30-Jun-1941   Admit date: 12/31/2015   Primary Physician: [name], MD Primary Cardiologist: none  Reason for admission: Chest pain and elevated troponin  HPI: This is a 75 y.o.white male with hypertension, HLD, COPD, GI bleeding due to PUD >10 years ago and recently diagnosed lung nodule who presented with chest pain to [facility]. He does not have prior history of CAD.  Patient was in his usual state of health 1 week ago when patient was complaining of arm pain. However this pain was not associated with chest pain and was positional. Yesterday patient developed chest pain which was located in the epigastric location and was burning in character. This pain became worse so he came to the ED at [facility]. Patient was ruled out for ACS. Currently he rates it as 6/10 in intensity, it has been coming and going since yesterday. He was given morphine yesterday for the pain.  He recently had symptoms of cough and wheezing with grayish colored sputum. He denied hemoptysis, nausea, vomiting, fevers, chills, diarrhea otherwise. He is being treated with ceftriaxone for CAP. The HPOA is daughter.  He has clarified his wishes as DNR. However he will like to change to full code for cardiac cath procedure.    Problem List: Past Medical History  Diagnosis Date  . Ulcer   . Asthma   . Hypertension     Past Surgical History  Procedure Laterality Date  . Hernia repair       Allergies:  Allergies  Allergen Reactions  . Asa [Aspirin]     Bleeding ulcer     Home Medications Current Facility-Administered Medications  Medication Dose Route Frequency Provider Last Rate Last Dose  . 0.9 %  sodium chloride infusion   Intravenous Continuous Meredeth Ide, MD 10 mL/hr at 01/01/16 0600    . 0.9 %  sodium chloride infusion   Intravenous Continuous Vesta Mixer, MD       . acetaminophen (TYLENOL) tablet 650 mg  650 mg Oral Q6H PRN Meredeth Ide, MD       Or  . acetaminophen (TYLENOL) suppository 650 mg  650 mg Rectal Q6H PRN Meredeth Ide, MD      . albuterol (PROVENTIL) (2.5 MG/3ML) 0.083% nebulizer solution 2.5 mg  2.5 mg Nebulization Q2H PRN Meredeth Ide, MD      . aspirin tablet 325 mg  325 mg Oral Daily Erick Blinks, MD   325 mg at 01/01/16 1107  . atorvastatin (LIPITOR) tablet 80 mg  80 mg Oral q1800 Erick Blinks, MD      . Melene Muller ON 01/02/2016] azithromycin (ZITHROMAX) 500 mg in dextrose 5 % 250 mL IVPB  500 mg Intravenous Q24H Meredeth Ide, MD      . Melene Muller ON 01/02/2016] cefTRIAXone (ROCEPHIN) 1 g in dextrose 5 % 50 mL IVPB  1 g Intravenous Q24H Erick Blinks, MD      . cloNIDine (CATAPRES) tablet 0.1 mg  0.1 mg Oral Daily Meredeth Ide, MD   0.1 mg at 01/01/16 0851  . clopidogrel (PLAVIX) tablet 75 mg  75 mg Oral Daily Joellyn Rued, MD      . enoxaparin (LOVENOX) injection 40 mg  40 mg Subcutaneous Q24H Meredeth Ide, MD   40 mg at 01/01/16 0536  . finasteride (PROSCAR) tablet 5 mg  5 mg Oral Daily Meredeth Ide,  MD   5 mg at 01/01/16 1031  . guaiFENesin (MUCINEX) 12 hr tablet 600 mg  600 mg Oral BID Meredeth Ide, MD   600 mg at 01/01/16 0851  . heparin ADULT infusion 100 units/mL (25000 units/250 mL)  1,000 Units/hr Intravenous Continuous Erick Blinks, MD 10 mL/hr at 01/01/16 1128 1,000 Units/hr at 01/01/16 1128  . hydrochlorothiazide (HYDRODIURIL) tablet 25 mg  25 mg Oral Daily Joellyn Rued, MD      . losartan (COZAAR) tablet 50 mg  50 mg Oral Daily Vesta Mixer, MD       And  . hydrochlorothiazide (MICROZIDE) capsule 12.5 mg  12.5 mg Oral Daily Vesta Mixer, MD      . ipratropium-albuterol (DUONEB) 0.5-2.5 (3) MG/3ML nebulizer solution 3 mL  3 mL Nebulization QID Meredeth Ide, MD   3 mL at 01/01/16 2108  . morphine 2 MG/ML injection 2 mg  2 mg Intravenous Q4H PRN Joellyn Rued, MD      . nitroGLYCERIN (NITROSTAT) 0.4 MG SL tablet            . nitroGLYCERIN (NITROSTAT) SL tablet 0.4 mg  0.4 mg Sublingual Q5 min PRN Vesta Mixer, MD   0.4 mg at 01/01/16 2023  . rivastigmine (EXELON) 4.6 mg/24hr 4.6 mg  4.6 mg Transdermal Daily Joellyn Rued, MD      . sodium chloride flush (NS) 0.9 % injection 3 mL  3 mL Intravenous Q12H Meredeth Ide, MD   3 mL at 01/01/16 1034  . sodium chloride flush (NS) 0.9 % injection 3 mL  3 mL Intravenous Q12H Joellyn Rued, MD      . tamsulosin (FLOMAX) capsule 0.8 mg  0.8 mg Oral Daily Meredeth Ide, MD   0.8 mg at 01/01/16 1031  . tiotropium (SPIRIVA) inhalation capsule 18 mcg  1 capsule Inhalation Q24H Joellyn Rued, MD         History reviewed. No pertinent family history.   Social History   Social History  . Marital Status: Single    Spouse Name: N/A  . Number of Children: N/A  . Years of Education: N/A   Occupational History  . Not on file.   Social History Main Topics  . Smoking status: Never Smoker   . Smokeless tobacco: Not on file  . Alcohol Use: No  . Drug Use: Not on file  . Sexual Activity: Not on file   Other Topics Concern  . Not on file   Social History Narrative     Review of Systems: General: negative for chills, fever, night sweats or weight changes.  Cardiovascular: chest pain, dyspnea  Dermatological: negative for rash Respiratory: positive for cough or wheezing Urologic: negative for hematuria Abdominal: negative for nausea, vomiting, diarrhea, bright red blood per rectum, melena, or hematemesis Neurologic: negative for visual changes, syncope, or dizziness Endocrine: no diabetes, no hypothyroidism Immunological: no lymph adenopathy Psych: non homicidal/suicidal  Physical Exam: Vitals: BP 120/62 mmHg  Pulse 57  Temp(Src) 97.8 F (36.6 C) (Oral)  Resp 15  Ht 5\' 8"  (1.727 m)  Wt 88.8 kg (195 lb 12.3 oz)  BMI 29.77 kg/m2  SpO2 99% General: not in acute distress Neck: JVP flat, neck supple Heart: regular rate and rhythm, S1, S2, no murmurs   Lungs: mild wheezing bilaterally GI: non tender, non distended, bowel sounds present Extremities: no edema Neuro: AAO x 3  Psych: normal affect, no anxiety   Labs:  Results for orders placed or performed during the hospital encounter of 12/31/15 (from the past 24 hour(s))  CBC with Differential     Status: Abnormal   Collection Time: 12/31/15 10:27 PM  Result Value Ref Range   WBC 13.7 (H) 4.0 - 10.5 K/uL   RBC 5.25 4.22 - 5.81 MIL/uL   Hemoglobin 16.0 13.0 - 17.0 g/dL   HCT 16.147.0 09.639.0 - 04.552.0 %   MCV 89.5 78.0 - 100.0 fL   MCH 30.5 26.0 - 34.0 pg   MCHC 34.0 30.0 - 36.0 g/dL   RDW 40.913.6 81.111.5 - 91.415.5 %   Platelets 216 150 - 400 K/uL   Neutrophils Relative % 76 %   Neutro Abs 10.3 (H) 1.7 - 7.7 K/uL   Lymphocytes Relative 11 %   Lymphs Abs 1.6 0.7 - 4.0 K/uL   Monocytes Relative 10 %   Monocytes Absolute 1.4 (H) 0.1 - 1.0 K/uL   Eosinophils Relative 3 %   Eosinophils Absolute 0.4 0.0 - 0.7 K/uL   Basophils Relative 0 %   Basophils Absolute 0.0 0.0 - 0.1 K/uL  Comprehensive metabolic panel     Status: Abnormal   Collection Time: 12/31/15 10:27 PM  Result Value Ref Range   Sodium 139 135 - 145 mmol/L   Potassium 3.3 (L) 3.5 - 5.1 mmol/L   Chloride 104 101 - 111 mmol/L   CO2 29 22 - 32 mmol/L   Glucose, Bld 183 (H) 65 - 99 mg/dL   BUN 25 (H) 6 - 20 mg/dL   Creatinine, Ser 7.821.35 (H) 0.61 - 1.24 mg/dL   Calcium 9.4 8.9 - 95.610.3 mg/dL   Total Protein 6.8 6.5 - 8.1 g/dL   Albumin 3.9 3.5 - 5.0 g/dL   AST 18 15 - 41 U/L   ALT 17 17 - 63 U/L   Alkaline Phosphatase 62 38 - 126 U/L   Total Bilirubin 0.5 0.3 - 1.2 mg/dL   GFR calc non Af Amer 50 (L) >60 mL/min   GFR calc Af Amer 58 (L) >60 mL/min   Anion gap 6 5 - 15  Troponin I     Status: None   Collection Time: 12/31/15 10:27 PM  Result Value Ref Range   Troponin I <0.03 <0.031 ng/mL  Blood culture (routine x 2)     Status: None (Preliminary result)   Collection Time: 01/01/16  1:14 AM  Result Value Ref Range   Specimen  Description RIGHT ANTECUBITAL    Special Requests BOTTLES DRAWN AEROBIC ONLY 6CC    Culture PENDING    Report Status PENDING   Blood culture (routine x 2)     Status: None (Preliminary result)   Collection Time: 01/01/16  1:25 AM  Result Value Ref Range   Specimen Description LEFT ANTECUBITAL    Special Requests BOTTLES DRAWN AEROBIC AND ANAEROBIC 6CC    Culture PENDING    Report Status PENDING   Troponin I     Status: Abnormal   Collection Time: 01/01/16  1:25 AM  Result Value Ref Range   Troponin I 0.12 (H) <0.031 ng/mL  MRSA PCR Screening     Status: None   Collection Time: 01/01/16  2:45 AM  Result Value Ref Range   MRSA by PCR NEGATIVE NEGATIVE  CBC     Status: Abnormal   Collection Time: 01/01/16  4:49 AM  Result Value Ref Range   WBC 11.0 (H) 4.0 - 10.5 K/uL   RBC 4.88 4.22 - 5.81 MIL/uL  Hemoglobin 14.5 13.0 - 17.0 g/dL   HCT 16.1 09.6 - 04.5 %   MCV 90.2 78.0 - 100.0 fL   MCH 29.7 26.0 - 34.0 pg   MCHC 33.0 30.0 - 36.0 g/dL   RDW 40.9 81.1 - 91.4 %   Platelets 220 150 - 400 K/uL  Comprehensive metabolic panel     Status: Abnormal   Collection Time: 01/01/16  4:49 AM  Result Value Ref Range   Sodium 140 135 - 145 mmol/L   Potassium 3.5 3.5 - 5.1 mmol/L   Chloride 104 101 - 111 mmol/L   CO2 30 22 - 32 mmol/L   Glucose, Bld 106 (H) 65 - 99 mg/dL   BUN 23 (H) 6 - 20 mg/dL   Creatinine, Ser 7.82 0.61 - 1.24 mg/dL   Calcium 9.2 8.9 - 95.6 mg/dL   Total Protein 6.1 (L) 6.5 - 8.1 g/dL   Albumin 3.5 3.5 - 5.0 g/dL   AST 15 15 - 41 U/L   ALT 14 (L) 17 - 63 U/L   Alkaline Phosphatase 56 38 - 126 U/L   Total Bilirubin 0.7 0.3 - 1.2 mg/dL   GFR calc non Af Amer >60 >60 mL/min   GFR calc Af Amer >60 >60 mL/min   Anion gap 6 5 - 15  Troponin I     Status: Abnormal   Collection Time: 01/01/16  8:57 AM  Result Value Ref Range   Troponin I 2.79 (HH) <0.031 ng/mL  Protime-INR     Status: None   Collection Time: 01/01/16 11:42 AM  Result Value Ref Range   Prothrombin  Time 14.2 11.6 - 15.2 seconds   INR 1.08 0.00 - 1.49  Troponin I     Status: Abnormal   Collection Time: 01/01/16  2:29 PM  Result Value Ref Range   Troponin I 3.19 (HH) <0.031 ng/mL  Brain natriuretic peptide     Status: None   Collection Time: 01/01/16  6:40 PM  Result Value Ref Range   B Natriuretic Peptide 36.1 0.0 - 100.0 pg/mL  TSH     Status: None   Collection Time: 01/01/16  6:40 PM  Result Value Ref Range   TSH 1.717 0.350 - 4.500 uIU/mL  Troponin I     Status: Abnormal   Collection Time: 01/01/16  6:40 PM  Result Value Ref Range   Troponin I 2.29 (HH) <0.031 ng/mL     Radiology/Studies: Ct Angio Chest Aorta W/cm &/or Wo/cm  01/01/2016  CLINICAL DATA:  Sudden onset of central chest pain radiating to the back. Rule out dissection. EXAM: CT ANGIOGRAPHY CHEST, ABDOMEN AND PELVIS TECHNIQUE: Multidetector CT imaging through the chest, abdomen and pelvis was performed using the standard protocol during bolus administration of intravenous contrast. Multiplanar reconstructed images and MIPs were obtained and reviewed to evaluate the vascular anatomy. CONTRAST:  OMNIPAQUE IOHEXOL 350 MG/ML SOLN COMPARISON:  None. FINDINGS: CTA CHEST FINDINGS Normal caliber thoracic aorta without dissection, aneurysm, hematoma or acute aortic syndrome. Mild atherosclerosis. Conventional branching pattern from the aortic arch. No filling defects in the pulmonary arteries to the segmental level. Heart is normal in size. Coronary artery calcification versus stent. Scattered normal size mediastinal nodes, no pathologic adenopathy. No hilar adenopathy. Lobular right apical nodule measures 1.5 x 1.3 x 2.2 cm with minimal surrounding spiculation. No additional suspicious pulmonary nodule. Faint reticular nodular subpleural opacity in the anterior right upper lobe likely infectious or inflammatory. No consolidation or pulmonary edema. Sub cm lucency within  T4 vertebral body, nonspecific. No sclerotic lesions. No  acute osseous abnormality. Review of the MIP images confirms the above findings. CTA ABDOMEN AND PELVIS FINDINGS Normal caliber abdominal aorta without aneurysm, dissection, hematoma or acute aortic abnormality. Moderate diffuse atherosclerosis. Celiac, superior mesenteric, and inferior mesenteric arteries are patent. Single bilateral renal arteries are patent. Atherosclerosis of both iliac arteries without significant stenosis. Arterial phase imaging of the liver, spleen, and pancreas are normal. No adrenal nodule. Symmetric renal enhancement. Exophytic lower pole cyst on the left measures 2.1 cm. Stomach distended with ingested contents. There are no dilated or thickened bowel loops. Distal small bowel loops are fluid-filled. Colonic tortuosity with small to moderate stool burden. No colonic wall thickening. Small retroperitoneal lymph nodes, not enlarged by size criteria. No pelvic adenopathy. Enlarged prostate gland measuring 6.6 cm transverse with mass effect on the bladder base. Bladder physiologically distended. There is fat in the right inguinal canal. There are no acute or suspicious osseous abnormalities. Review of the MIP images confirms the above findings. IMPRESSION: 1. Normal caliber thoracoabdominal aorta without dissection or acute aortic abnormality. Moderate atherosclerosis. 2. Lobular right apical pulmonary nodule measures 2.2 x 1.5 x 1.3 cm with minimal spiculation, concerning for primary bronchogenic neoplasm. There is no adenopathy. Other than a subcentimeter lucency in T4 which is nonspecific, no evidence of metastatic disease on this arterial phase imaging exams. This bone lucency may simply represent normal variation. Recommend PET-CT characterization. 3. Faint reticulonodular opacity in the subpleural right lower lobe is likely infectious or inflammatory. 4. No acute abnormality in the abdomen/pelvis. Prostatomegaly is incidentally noted. Electronically Signed   By: Rubye Oaks M.D.    On: 01/01/2016 00:47   Ct Cta Abd/pel W/cm &/or W/o Cm  01/01/2016  CLINICAL DATA:  Sudden onset of central chest pain radiating to the back. Rule out dissection. EXAM: CT ANGIOGRAPHY CHEST, ABDOMEN AND PELVIS TECHNIQUE: Multidetector CT imaging through the chest, abdomen and pelvis was performed using the standard protocol during bolus administration of intravenous contrast. Multiplanar reconstructed images and MIPs were obtained and reviewed to evaluate the vascular anatomy. CONTRAST:  OMNIPAQUE IOHEXOL 350 MG/ML SOLN COMPARISON:  None. FINDINGS: CTA CHEST FINDINGS Normal caliber thoracic aorta without dissection, aneurysm, hematoma or acute aortic syndrome. Mild atherosclerosis. Conventional branching pattern from the aortic arch. No filling defects in the pulmonary arteries to the segmental level. Heart is normal in size. Coronary artery calcification versus stent. Scattered normal size mediastinal nodes, no pathologic adenopathy. No hilar adenopathy. Lobular right apical nodule measures 1.5 x 1.3 x 2.2 cm with minimal surrounding spiculation. No additional suspicious pulmonary nodule. Faint reticular nodular subpleural opacity in the anterior right upper lobe likely infectious or inflammatory. No consolidation or pulmonary edema. Sub cm lucency within T4 vertebral body, nonspecific. No sclerotic lesions. No acute osseous abnormality. Review of the MIP images confirms the above findings. CTA ABDOMEN AND PELVIS FINDINGS Normal caliber abdominal aorta without aneurysm, dissection, hematoma or acute aortic abnormality. Moderate diffuse atherosclerosis. Celiac, superior mesenteric, and inferior mesenteric arteries are patent. Single bilateral renal arteries are patent. Atherosclerosis of both iliac arteries without significant stenosis. Arterial phase imaging of the liver, spleen, and pancreas are normal. No adrenal nodule. Symmetric renal enhancement. Exophytic lower pole cyst on the left measures 2.1 cm.  Stomach distended with ingested contents. There are no dilated or thickened bowel loops. Distal small bowel loops are fluid-filled. Colonic tortuosity with small to moderate stool burden. No colonic wall thickening. Small retroperitoneal lymph nodes, not enlarged by  size criteria. No pelvic adenopathy. Enlarged prostate gland measuring 6.6 cm transverse with mass effect on the bladder base. Bladder physiologically distended. There is fat in the right inguinal canal. There are no acute or suspicious osseous abnormalities. Review of the MIP images confirms the above findings. IMPRESSION: 1. Normal caliber thoracoabdominal aorta without dissection or acute aortic abnormality. Moderate atherosclerosis. 2. Lobular right apical pulmonary nodule measures 2.2 x 1.5 x 1.3 cm with minimal spiculation, concerning for primary bronchogenic neoplasm. There is no adenopathy. Other than a subcentimeter lucency in T4 which is nonspecific, no evidence of metastatic disease on this arterial phase imaging exams. This bone lucency may simply represent normal variation. Recommend PET-CT characterization. 3. Faint reticulonodular opacity in the subpleural right lower lobe is likely infectious or inflammatory. 4. No acute abnormality in the abdomen/pelvis. Prostatomegaly is incidentally noted. Electronically Signed   By: Rubye Oaks M.D.   On: 01/01/2016 00:47    EKG: normal sinus rhythm, non specific ST elevation in the lead III.   Echo: 01/01/2016 - Left ventricle: The cavity size was normal. Systolic function was  normal. The estimated ejection fraction was in the range of 60%  to 65%. Wall motion was normal; there were no regional wall  motion abnormalities. - Aortic valve: Valve area (VTI): 2.88 cm^2. Valve area (Vmax):  3.11 cm^2. Valve area (Vmean): 2.62 cm^2.   Cardiac cath: none  Medical decision making:  Discussed care with the patient and the daughter Reviewed labs and imaging personally Reviewed  prior records  ASSESSMENT AND PLAN:  This is a 75 y.o. male wwhite male with no prior history of CAD and risk factors (hypertension, HLD), COPD, GI bleeding due to PUD >10 years ago and asthma recently diagnosed lung nodule who presented with chest pain to [facility].   Principal Problem:   NSTEMI (non-ST elevated myocardial infarction) [facility]) Active Problems:   Chest pain   CAP (community acquired pneumonia)   COPD (chronic obstructive pulmonary disease) (HCC)   Lung nodule  NSTEMI Cycle troponin, serial EKGs prn, lipid panel, TSH, HbA1c, echocardiogram in the AM IV heparin, aspirin, high dose statin (lipitor 80 mg daily), Consider cardiac catheterization   COPD exacerbation - nebs, oxygen, ceftriaxone and azithromycin for PNA, can start steroids later after cardiac cath  Hypertension Continue losartan, HCTZ, holding clonidine due to bradycardia and HR drop to 40s (home meds)  Lung nodule Outpatient follow up with PET/CT   BPH Continue proscar  Asymptomatic bradycardia Stopped clonidine, low dose so no need to taper  Signed, Joellyn Rued, MD MS 01/01/2016, 9:36 PM

## 2016-01-01 NOTE — Progress Notes (Signed)
Pt trop 2.29.  Pt c/o mid chest pain 5/10 after ambulatiing to bathroom.  VSS, EKG obtained with no changes noted.  Pt received 2 SL ntg thus far per standing orders.  MD advised via text page.  Awaiting return call.  Will continue to monitor.

## 2016-01-01 NOTE — H&P (Signed)
PCP:   [name], MD   Chief Complaint:  Chest pain  HPI:  75 year old male who  has a past medical history of Ulcer; Asthma; and Hypertension. today presents to the hospital with chest pain which started around 8:30 PM tonight. Patient says that he has been having symptoms of coughing for past to 3 weeks. He was also coughing up gray colored phlegm. He denies nausea vomiting or diarrhea. No dysuria. No fever or chills. Chest pain has now resolved. EKG shows nonspecific ST-T changes. As the chest pain radiated to the back, CT angiogram of the chest and abdomen were done which was negative for dissection of aorta. CT chest also showed  right apical pulmonary nodule.  Allergies:   Allergies  Allergen Reactions  . Asa [Aspirin]     Bleeding ulcer      Past Medical History  Diagnosis Date  . Ulcer   . Asthma   . Hypertension     Past Surgical History  Procedure Laterality Date  . Hernia repair      Prior to Admission medications   Medication Sig Start Date End Date Taking? Authorizing Provider  cloNIDine (CATAPRES) 0.1 MG tablet Take 1 tablet by mouth daily. 06/19/14   Historical Provider, MD  enalapril (VASOTEC) 20 MG tablet Take 20 mg by mouth daily. 05/06/14   Historical Provider, MD  finasteride (PROSCAR) 5 MG tablet Take 5 mg by mouth 2 (two) times daily.  06/09/14   Historical Provider, MD  levofloxacin (LEVAQUIN) 500 MG tablet Take 1 tablet (500 mg total) by mouth daily. 06/30/14   Linwood Dibbles, MD  tamsulosin (FLOMAX) 0.4 MG CAPS capsule Take 1 capsule by mouth 2 (two) times daily. 06/09/14   Historical Provider, MD  VENTOLIN HFA 108 (90 BASE) MCG/ACT inhaler Inhale 2 puffs into the lungs every 6 (six) hours as needed for wheezing or shortness of breath.  06/30/14   Historical Provider, MD    Social History:  reports that he has never smoked. He does not have any smokeless tobacco history on file. He reports that he does not drink alcohol. His drug history is not on  file.  Family history of stroke  Filed Weights   12/31/15 2210  Weight: 87.091 kg (192 lb)    All the positives are listed in BOLD  Review of Systems:  HEENT: Headache, blurred vision, runny nose, sore throat Neck: Hypothyroidism, hyperthyroidism,,lymphadenopathy Chest : Shortness of breath, history of COPD, Asthma Heart : Chest pain, history of coronary arterey disease GI:  Nausea, vomiting, diarrhea, constipation, GERD GU: Dysuria, urgency, frequency of urination, hematuria Neuro: Stroke, seizures, syncope Psych: Depression, anxiety, hallucinations   Physical Exam: Blood pressure 165/75, pulse 63, temperature 97.7 F (36.5 C), temperature source Oral, resp. rate 17, height  (1.727 m), weight 87.091 kg (192 lb), SpO2 95 %. Constitutional:   Patient is a well-developed and well-nourished male * in no acute distress and cooperative with exam. Head: Normocephalic and atraumatic Mouth: Mucus membranes moist Eyes: PERRL, EOMI, conjunctivae normal Neck: Supple, No Thyromegaly Cardiovascular: RRR, S1 normal, S2 normal Pulmonary/Chest: Decreased breath sounds bilaterally Abdominal: Soft. Non-tender, non-distended, bowel sounds are normal, no masses, organomegaly, or guarding present.  Neurological: A&O x3, Strength is normal and symmetric bilaterally, cranial nerve II-XII are grossly intact, no focal motor deficit, sensory intact to light touch bilaterally.  Extremities : No Cyanosis, Clubbing or Edema  Labs on Admission:  Basic Metabolic Panel:  Recent Labs Lab 12/31/15 2227  NA 139  K 3.3*  CL 104  CO2 29  GLUCOSE 183*  BUN 25*  CREATININE 1.35*  CALCIUM 9.4   Liver Function Tests:  Recent Labs Lab 12/31/15 2227  AST 18  ALT 17  ALKPHOS 62  BILITOT 0.5  PROT 6.8  ALBUMIN 3.9  CBC:  Recent Labs Lab 12/31/15 2227  WBC 13.7*  NEUTROABS 10.3*  HGB 16.0  HCT 47.0  MCV 89.5  PLT 216   Cardiac Enzymes:  Recent Labs Lab 12/31/15 2227  TROPONINI  <0.03     Radiological Exams on Admission: Ct Angio Chest Aorta W/cm &/or Wo/cm  01/01/2016  CLINICAL DATA:  Sudden onset of central chest pain radiating to the back. Rule out dissection. EXAM: CT ANGIOGRAPHY CHEST, ABDOMEN AND PELVIS TECHNIQUE: Multidetector CT imaging through the chest, abdomen and pelvis was performed using the standard protocol during bolus administration of intravenous contrast. Multiplanar reconstructed images and MIPs were obtained and reviewed to evaluate the vascular anatomy. CONTRAST:  OMNIPAQUE IOHEXOL 350 MG/ML SOLN COMPARISON:  None. FINDINGS: CTA CHEST FINDINGS Normal caliber thoracic aorta without dissection, aneurysm, hematoma or acute aortic syndrome. Mild atherosclerosis. Conventional branching pattern from the aortic arch. No filling defects in the pulmonary arteries to the segmental level. Heart is normal in size. Coronary artery calcification versus stent. Scattered normal size mediastinal nodes, no pathologic adenopathy. No hilar adenopathy. Lobular right apical nodule measures 1.5 x 1.3 x 2.2 cm with minimal surrounding spiculation. No additional suspicious pulmonary nodule. Faint reticular nodular subpleural opacity in the anterior right upper lobe likely infectious or inflammatory. No consolidation or pulmonary edema. Sub cm lucency within T4 vertebral body, nonspecific. No sclerotic lesions. No acute osseous abnormality. Review of the MIP images confirms the above findings. CTA ABDOMEN AND PELVIS FINDINGS Normal caliber abdominal aorta without aneurysm, dissection, hematoma or acute aortic abnormality. Moderate diffuse atherosclerosis. Celiac, superior mesenteric, and inferior mesenteric arteries are patent. Single bilateral renal arteries are patent. Atherosclerosis of both iliac arteries without significant stenosis. Arterial phase imaging of the liver, spleen, and pancreas are normal. No adrenal nodule. Symmetric renal enhancement. Exophytic lower pole cyst  on the left measures 2.1 cm. Stomach distended with ingested contents. There are no dilated or thickened bowel loops. Distal small bowel loops are fluid-filled. Colonic tortuosity with small to moderate stool burden. No colonic wall thickening. Small retroperitoneal lymph nodes, not enlarged by size criteria. No pelvic adenopathy. Enlarged prostate gland measuring 6.6 cm transverse with mass effect on the bladder base. Bladder physiologically distended. There is fat in the right inguinal canal. There are no acute or suspicious osseous abnormalities. Review of the MIP images confirms the above findings. IMPRESSION: 1. Normal caliber thoracoabdominal aorta without dissection or acute aortic abnormality. Moderate atherosclerosis. 2. Lobular right apical pulmonary nodule measures 2.2 x 1.5 x 1.3 cm with minimal spiculation, concerning for primary bronchogenic neoplasm. There is no adenopathy. Other than a subcentimeter lucency in T4 which is nonspecific, no evidence of metastatic disease on this arterial phase imaging exams. This bone lucency may simply represent normal variation. Recommend PET-CT characterization. 3. Faint reticulonodular opacity in the subpleural right lower lobe is likely infectious or inflammatory. 4. No acute abnormality in the abdomen/pelvis. Prostatomegaly is incidentally noted. Electronically Signed   By: Rubye Oaks M.D.   On: 01/01/2016 00:47   Ct Cta Abd/pel W/cm &/or W/o Cm  01/01/2016  CLINICAL DATA:  Sudden onset of central chest pain radiating to the back. Rule out dissection. EXAM: CT ANGIOGRAPHY CHEST, ABDOMEN AND PELVIS  TECHNIQUE: Multidetector CT imaging through the chest, abdomen and pelvis was performed using the standard protocol during bolus administration of intravenous contrast. Multiplanar reconstructed images and MIPs were obtained and reviewed to evaluate the vascular anatomy. CONTRAST:  100mL OMNIPAQUE IOHEXOL 350 MG/ML SOLN COMPARISON:  None. FINDINGS: CTA CHEST  FINDINGS Normal caliber thoracic aorta without dissection, aneurysm, hematoma or acute aortic syndrome. Mild atherosclerosis. Conventional branching pattern from the aortic arch. No filling defects in the pulmonary arteries to the segmental level. Heart is normal in size. Coronary artery calcification versus stent. Scattered normal size mediastinal nodes, no pathologic adenopathy. No hilar adenopathy. Lobular right apical nodule measures 1.5 x 1.3 x 2.2 cm with minimal surrounding spiculation. No additional suspicious pulmonary nodule. Faint reticular nodular subpleural opacity in the anterior right upper lobe likely infectious or inflammatory. No consolidation or pulmonary edema. Sub cm lucency within T4 vertebral body, nonspecific. No sclerotic lesions. No acute osseous abnormality. Review of the MIP images confirms the above findings. CTA ABDOMEN AND PELVIS FINDINGS Normal caliber abdominal aorta without aneurysm, dissection, hematoma or acute aortic abnormality. Moderate diffuse atherosclerosis. Celiac, superior mesenteric, and inferior mesenteric arteries are patent. Single bilateral renal arteries are patent. Atherosclerosis of both iliac arteries without significant stenosis. Arterial phase imaging of the liver, spleen, and pancreas are normal. No adrenal nodule. Symmetric renal enhancement. Exophytic lower pole cyst on the left measures 2.1 cm. Stomach distended with ingested contents. There are no dilated or thickened bowel loops. Distal small bowel loops are fluid-filled. Colonic tortuosity with small to moderate stool burden. No colonic wall thickening. Small retroperitoneal lymph nodes, not enlarged by size criteria. No pelvic adenopathy. Enlarged prostate gland measuring 6.6 cm transverse with mass effect on the bladder base. Bladder physiologically distended. There is fat in the right inguinal canal. There are no acute or suspicious osseous abnormalities. Review of the MIP images confirms the above  findings. IMPRESSION: 1. Normal caliber thoracoabdominal aorta without dissection or acute aortic abnormality. Moderate atherosclerosis. 2. Lobular right apical pulmonary nodule measures 2.2 x 1.5 x 1.3 cm with minimal spiculation, concerning for primary bronchogenic neoplasm. There is no adenopathy. Other than a subcentimeter lucency in T4 which is nonspecific, no evidence of metastatic disease on this arterial phase imaging exams. This bone lucency may simply represent normal variation. Recommend PET-CT characterization. 3. Faint reticulonodular opacity in the subpleural right lower lobe is likely infectious or inflammatory. 4. No acute abnormality in the abdomen/pelvis. Prostatomegaly is incidentally noted. Electronically Signed   By: Rubye OaksMelanie  Ehinger M.D.   On: 01/01/2016 00:47    EKG: Independently reviewed. Non specific ST T changes   Assessment/Plan Active Problems:   Chest pain   CAP (community acquired pneumonia)   COPD (chronic obstructive pulmonary disease) (HCC)   Lung nodule   Hypokalemia    Chest pain Will admit the patient in telemetry Cycle cardiac enzymes, to rule out ACS  Hypertension Continue Catapres, Vasotec  Community-acquired pneumonia CT chest showed faint reticulonodular opacity in the subpleural right lower lobe likely infectious or inflammatory. Patient started on ceftriaxone and Zithromax for possible pneumonia. Follow blood cultures, urine strep pneumo antigen  Hypokalemia Replace potassium and check BMP in a.m.  Lung nodule CT chest shows 2.21.51.3 cm nodule in the apical right lung. Patient will need PET/CT as outpatient  COPD Patient came with mild wheezing Start on duo nebs every 6 hours, Mucinex 1 tablet by mouth twice a day  DVT prophylaxis Lovenox  Code status: DNR  Family discussion: Admission, patients condition  and plan of care including tests being ordered have been discussed with the patient and his daughter at bedside* who  indicate understanding and agree with the plan and Code Status.   Time Spent on Admission: 55 min  Rashaun Wichert S [facility] Pager: [phone] 01/01/2016, 1:57 AM  If 7PM-7AM, please contact night-coverage  [email or web address]  Password [email or web address]

## 2016-01-01 NOTE — Progress Notes (Signed)
[facility]  Belongings went with pt.  Family at bedside at time of transport.  Pt in no acute distress  Vitals signs as follows:  Temp: 97.5 F (36.4 C) (03/11 1608) Temp Source: Oral (03/11 1608) BP: 132/59 mmHg (03/11 1642) Pulse Rate: 52 (03/11 1615) Respirations: 14

## 2016-01-02 ENCOUNTER — Encounter (HOSPITAL_COMMUNITY): Payer: Self-pay | Admitting: Cardiology

## 2016-01-02 DIAGNOSIS — Z955 Presence of coronary angioplasty implant and graft: Secondary | ICD-10-CM

## 2016-01-02 DIAGNOSIS — I25110 Atherosclerotic heart disease of native coronary artery with unstable angina pectoris: Secondary | ICD-10-CM | POA: Clinically undetermined

## 2016-01-02 DIAGNOSIS — E785 Hyperlipidemia, unspecified: Secondary | ICD-10-CM

## 2016-01-02 HISTORY — DX: Atherosclerotic heart disease of native coronary artery with unstable angina pectoris: I25.110

## 2016-01-02 HISTORY — DX: Hyperlipidemia, unspecified: E78.5

## 2016-01-02 HISTORY — DX: Presence of coronary angioplasty implant and graft: Z95.5

## 2016-01-02 LAB — CBC
HEMATOCRIT: 42.1 % (ref 39.0–52.0)
HEMOGLOBIN: 13.8 g/dL (ref 13.0–17.0)
MCH: 29.1 pg (ref 26.0–34.0)
MCHC: 32.8 g/dL (ref 30.0–36.0)
MCV: 88.6 fL (ref 78.0–100.0)
Platelets: 208 10*3/uL (ref 150–400)
RBC: 4.75 MIL/uL (ref 4.22–5.81)
RDW: 13.7 % (ref 11.5–15.5)
WBC: 11.1 10*3/uL — ABNORMAL HIGH (ref 4.0–10.5)

## 2016-01-02 LAB — POCT ACTIVATED CLOTTING TIME
Activated Clotting Time: 281 seconds
Activated Clotting Time: 337 seconds

## 2016-01-02 LAB — POCT I-STAT 3, ART BLOOD GAS (G3+)
Acid-base deficit: 2 mmol/L (ref 0.0–2.0)
BICARBONATE: 21.6 meq/L (ref 20.0–24.0)
O2 Saturation: 92 %
PCO2 ART: 32 mmHg — AB (ref 35.0–45.0)
Patient temperature: 99.2
TCO2: 23 mmol/L (ref 0–100)
pH, Arterial: 7.439 (ref 7.350–7.450)
pO2, Arterial: 62 mmHg — ABNORMAL LOW (ref 80.0–100.0)

## 2016-01-02 LAB — COMPREHENSIVE METABOLIC PANEL
ALBUMIN: 3.2 g/dL — AB (ref 3.5–5.0)
ALT: 16 U/L — ABNORMAL LOW (ref 17–63)
ANION GAP: 9 (ref 5–15)
AST: 37 U/L (ref 15–41)
Alkaline Phosphatase: 50 U/L (ref 38–126)
BUN: 18 mg/dL (ref 6–20)
CO2: 25 mmol/L (ref 22–32)
Calcium: 9 mg/dL (ref 8.9–10.3)
Chloride: 106 mmol/L (ref 101–111)
Creatinine, Ser: 1.18 mg/dL (ref 0.61–1.24)
GFR calc non Af Amer: 59 mL/min — ABNORMAL LOW (ref >60)
GLUCOSE: 115 mg/dL — AB (ref 65–99)
POTASSIUM: 3.6 mmol/L (ref 3.5–5.1)
SODIUM: 140 mmol/L (ref 135–145)
TOTAL PROTEIN: 5.2 g/dL — AB (ref 6.5–8.1)
Total Bilirubin: 0.3 mg/dL (ref 0.3–1.2)

## 2016-01-02 LAB — URINE MICROSCOPIC-ADD ON
BACTERIA UA: NONE SEEN
SQUAMOUS EPITHELIAL / LPF: NONE SEEN
WBC UA: NONE SEEN WBC/hpf (ref 0–5)

## 2016-01-02 LAB — PROTIME-INR
INR: 1.05 (ref 0.00–1.49)
PROTHROMBIN TIME: 13.9 s (ref 11.6–15.2)

## 2016-01-02 LAB — TROPONIN I
TROPONIN I: 1.15 ng/mL — AB (ref <0.031)
Troponin I: 7.83 ng/mL (ref <0.031)

## 2016-01-02 LAB — URINALYSIS, ROUTINE W REFLEX MICROSCOPIC
Bilirubin Urine: NEGATIVE
GLUCOSE, UA: NEGATIVE mg/dL
KETONES UR: NEGATIVE mg/dL
LEUKOCYTES UA: NEGATIVE
Nitrite: NEGATIVE
PH: 6 (ref 5.0–8.0)
Protein, ur: NEGATIVE mg/dL
Specific Gravity, Urine: 1.03 (ref 1.005–1.030)

## 2016-01-02 LAB — GLUCOSE, CAPILLARY
GLUCOSE-CAPILLARY: 96 mg/dL (ref 65–99)
GLUCOSE-CAPILLARY: 126 mg/dL — AB (ref 65–99)

## 2016-01-02 MED ORDER — SODIUM CHLORIDE 0.9 % WEIGHT BASED INFUSION
3.0000 mL/kg/h | INTRAVENOUS | Status: AC
  Administered 2016-01-02: 3 mL/kg/h via INTRAVENOUS

## 2016-01-02 MED ORDER — TICAGRELOR 90 MG PO TABS
ORAL_TABLET | ORAL | Status: AC
Start: 2016-01-02 — End: 2016-01-02
  Filled 2016-01-02: qty 1

## 2016-01-02 MED ORDER — TIROFIBAN HCL IV 12.5 MG/250 ML
0.1500 ug/kg/min | INTRAVENOUS | Status: AC
  Administered 2016-01-02: 0.15 ug/kg/min via INTRAVENOUS
  Filled 2016-01-02: qty 250

## 2016-01-02 MED ORDER — MIDAZOLAM HCL 2 MG/2ML IJ SOLN
INTRAMUSCULAR | Status: AC
  Filled 2016-01-02: qty 2

## 2016-01-02 MED ORDER — TICAGRELOR 90 MG PO TABS
ORAL_TABLET | ORAL | Status: AC
  Filled 2016-01-02: qty 1

## 2016-01-02 MED ORDER — HEPARIN SODIUM (PORCINE) 1000 UNIT/ML IJ SOLN
INTRAMUSCULAR | Status: AC
  Filled 2016-01-02: qty 1

## 2016-01-02 MED ORDER — VERAPAMIL HCL 2.5 MG/ML IV SOLN
INTRAVENOUS | Status: AC
  Filled 2016-01-02: qty 2

## 2016-01-02 MED ORDER — IOHEXOL 350 MG/ML SOLN
INTRAVENOUS | Status: DC | PRN
  Administered 2016-01-02: 180 mL via INTRAVENOUS

## 2016-01-02 MED ORDER — TIROFIBAN HCL IN NACL 5-0.9 MG/100ML-% IV SOLN: INTRAVENOUS | Status: DC | PRN

## 2016-01-02 MED ORDER — SODIUM CHLORIDE 0.9 % IV SOLN: 250.0000 mL | INTRAVENOUS | Status: DC | PRN

## 2016-01-02 MED ORDER — TICAGRELOR 90 MG PO TABS
ORAL_TABLET | ORAL | Status: DC | PRN
  Administered 2016-01-02: 180 mg via ORAL

## 2016-01-02 MED ORDER — TIROFIBAN (AGGRASTAT) BOLUS VIA INFUSION
INTRAVENOUS | Status: DC | PRN
  Administered 2016-01-02: 2220 ug via INTRAVENOUS

## 2016-01-02 MED ORDER — MORPHINE SULFATE (PF) 2 MG/ML IV SOLN: 2.0000 mg | INTRAVENOUS | Status: DC | PRN

## 2016-01-02 MED ORDER — SODIUM CHLORIDE 0.9% FLUSH: 3.0000 mL | INTRAVENOUS | Status: DC | PRN

## 2016-01-02 MED ORDER — TICAGRELOR 90 MG PO TABS
90.0000 mg | ORAL_TABLET | Freq: Two times a day (BID) | ORAL | Status: DC
  Administered 2016-01-02 – 2016-01-11 (×18): 90 mg via ORAL
  Filled 2016-01-02 (×18): qty 1

## 2016-01-02 MED ORDER — HEPARIN SODIUM (PORCINE) 1000 UNIT/ML IJ SOLN
INTRAMUSCULAR | Status: DC | PRN
  Administered 2016-01-02: 6500 [IU] via INTRAVENOUS
  Administered 2016-01-02: 3000 [IU] via INTRAVENOUS

## 2016-01-02 MED ORDER — IPRATROPIUM-ALBUTEROL 0.5-2.5 (3) MG/3ML IN SOLN
3.0000 mL | Freq: Three times a day (TID) | RESPIRATORY_TRACT | Status: DC
  Administered 2016-01-03: 3 mL via RESPIRATORY_TRACT
  Filled 2016-01-02: qty 3

## 2016-01-02 MED ORDER — SODIUM CHLORIDE 0.9% FLUSH
3.0000 mL | Freq: Two times a day (BID) | INTRAVENOUS | Status: DC
  Administered 2016-01-02 – 2016-01-03 (×4): 3 mL via INTRAVENOUS

## 2016-01-02 NOTE — Progress Notes (Signed)
CONSULT NOTE  Pharmacy Consult for Aggrastat Indication: s/p cath   Assessment/Plan:  75yo male brought to cath lab urgently overnight for NSTEMI w/ recurrent CP, now heparin d/c'd, to continue Aggrastat x18hr post cath.  Will monitor CBC.  Vernard GamblesVeronda Shayley Medlin, PharmD, BCPS  01/02/2016,1:44 AM

## 2016-01-02 NOTE — Progress Notes (Signed)
Md page about pt confused, hallucinating.   Temp 99.1 A, sbp 176, hr 117. ABG and UA obtained earlier.  Pt c/o on and off about abd pain lower area.  Pt had BM.  BS hypo.   Placed pt on 2Lnc.   Family at bedside, will continue to monitor. Lee Chang, [name] T

## 2016-01-02 NOTE — Progress Notes (Signed)
md made aware of post cath ekg.  Acute Mi.  No change from previous one.  Will continue to monitor. Karena Addisonoro, Kwame Ryland T

## 2016-01-02 NOTE — Progress Notes (Addendum)
Patient noticabley more confused and disoriented.  Skin warm to touch.Leeann Must.  Jacob Kelly informed and will be in to assess.  Daughter in with patient

## 2016-01-02 NOTE — Progress Notes (Signed)
Cardiology Cross-Cover Note  Mr. Lee Chang is a 75 yo man with NSTEMI s/p PCI to LAD overnight who now has delirium. His daughter is accompanying at the bedside. He knows his name but thinks he is somewhere else and wants to know why we're asking him where he is. On exam, vitals reviewed; BP 170/81 mmHg  Pulse 116  Temp(Src) 99.7 F (37.6 C) (Axillary)  Resp 23  Ht 5\' 8"  (1.727 m)  Wt 89.767 kg (197 lb 14.4 oz)  BMI 30.10 kg/m2  SpO2 96%mild tremor and subtle cogwheeling in upper extremities. RRR, no m/r/g, lungs fairly CTAB, no wheezing/rales, no edema. Differential for symptoms of delirium include infection, medications, elevated bilirubin/liver injury/failure, age+MI+ other risk factors for hospital associated delirium among many other etiologies. Will repeat CMP with AM labs, obtain CT head, continue to re-orient, repeat blood cultures x2 (NGTD from 3/11), urinalysis, continue antibiotics, gentle improved BP control with hydralazine. Consider neurology consult for further or continued symptoms.   Leeann MustJacob Rosaland Shiffman, MD

## 2016-01-02 NOTE — Progress Notes (Signed)
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   CURRENT MEDS . atorvastatin  80 mg Oral q1800  . azithromycin  500 mg Intravenous Q24H  . cefTRIAXone (ROCEPHIN)  IV  1 g Intravenous Q24H  . finasteride  5 mg Oral Daily  . guaiFENesin  600 mg Oral BID  . ipratropium-albuterol  3 mL Nebulization QID  . losartan  50 mg Oral Daily  . rivastigmine  4.6 mg Transdermal Daily  . sodium chloride flush  3 mL Intravenous Q12H  . sodium chloride flush  3 mL Intravenous Q12H  . sodium chloride flush  3 mL Intravenous Q12H  . tamsulosin  0.8 mg Oral Daily  . ticagrelor  90 mg Oral BID  . tiotropium  1 capsule Inhalation Q24H   OBJECTIVE  Filed Vitals:   01/02/16 0700 01/02/16 0800 01/02/16 0816 01/02/16 0823  BP: 197/67 103/77 145/64   Pulse: 67 64 64   Temp:  97.8 F (36.6 C)    TempSrc:  Oral    Resp: Height:      Weight:      SpO2: 99% 98% 98% 99%    Intake/Output Summary (Last 24 hours) at 01/02/16 1134 Last data filed at 01/02/16 0800  Gross per 24 hour  Intake 1574.92 ml  Output   1050 ml  Net 524.92 ml   Filed Weights   01/01/16 1743 01/01/16 2323 01/02/16 0329  Weight: 195 lb 12.3 oz (88.8 kg) 195 lb 12.3 oz (88.8 kg) 197 lb 14.4 oz (89.767 kg)    PHYSICAL EXAM  General: somnolent, confused Neuro: Alert and oriented X 3. Moves all extremities spontaneously. Psych: Normal affect. HEENT:  Normal  Neck: Supple without bruits or JVD. Lungs:  Resp regular and  unlabored, CTA. Heart: RRR no s3, s4, or murmurs. Abdomen: Soft, non-tender, non-distended, BS + x 4.  Extremities: No clubbing, cyanosis or edema. DP/PT/Radials 2+ and equal bilaterally.  Accessory Clinical Findings  CBC  Recent Labs  12/31/15 2227 01/01/16 0449 01/02/16 0806  WBC 13.7* 11.0* 11.1*  NEUTROABS 10.3*  --   --   HGB 16.0 14.5 13.8  HCT 47.0 44.0 42.1  MCV 89.5 90.2 88.6  PLT 216 220 208   Basic Metabolic Panel  Recent Labs  01/01/16 0449 01/02/16 0806  NA 140 140  K 3.5 3.6  CL 104 106  CO2 30 25  GLUCOSE 106* 115*  BUN 23* 18  CREATININE 1.14 1.18  CALCIUM 9.2 9.0   Liver Function Tests  Recent Labs  01/01/16 0449 01/02/16 0806  AST 15 37  ALT 14* 16*  ALKPHOS 56 50  BILITOT 0.7 0.3  PROT 6.1* 5.2*  ALBUMIN 3.5 3.2*    Recent Labs  01/01/16 1840 01/02/16 0155 01/02/16 0806  TROPONINI 2.29* 1.15* 7.83*     Recent Labs  01/01/16 1840  TSH 1.717    Radiology/Studies  Ct Cta Abd/pel W/cm &/or W/o Cm  01/01/2016  CLINICAL  DATA:  Sudden onset of central chest pain radiating to the back. Rule out dissection. . IMPRESSION: 1. Normal caliber thoracoabdominal aorta without dissection or acute aortic abnormality. Moderate atherosclerosis. 2. Lobular right apical pulmonary nodule measures 2.2 x 1.5 x 1.3 cm with minimal spiculation, concerning for primary bronchogenic neoplasm. There is no adenopathy. Other than a subcentimeter lucency in T4 which is nonspecific, no evidence of metastatic disease on this arterial phase imaging exams. This bone lucency may simply represent normal variation. Recommend PET-CT characterization. 3. Faint reticulonodular opacity in the subpleural right lower lobe is likely infectious or inflammatory. 4. No acute abnormality in the abdomen/pelvis. Prostatomegaly is incidentally noted. Electronically Signed   By: Rubye OaksMelanie  Ehinger M.D.   On: 01/01/2016 00:47   TELE: SR   ASSESSMENT AND PLAN  1. NSTEMI , s/p PCI to  LAD, Contine Aggrastat infusion for minimum of 12 hours, preferably 18 hours due to the extent of thrombus  Aspirin plus Brilinta for minium of 3 months , then an stop aspirin, Continue aggressive risk factor modification including high-dose statin , addition of beta blocker , continue ARB  2. HTN - controlled  3. HLP - on atorvastatin   Signed, Lars MassonNELSON, Yama Nielson H MD, [facility] HospitalFACC 01/02/2016

## 2016-01-03 ENCOUNTER — Inpatient Hospital Stay (HOSPITAL_COMMUNITY): Payer: Medicare Other

## 2016-01-03 ENCOUNTER — Encounter (HOSPITAL_COMMUNITY): Payer: Self-pay | Admitting: Cardiology

## 2016-01-03 DIAGNOSIS — E785 Hyperlipidemia, unspecified: Secondary | ICD-10-CM

## 2016-01-03 DIAGNOSIS — R4182 Altered mental status, unspecified: Secondary | ICD-10-CM

## 2016-01-03 DIAGNOSIS — M7989 Other specified soft tissue disorders: Secondary | ICD-10-CM

## 2016-01-03 DIAGNOSIS — Z955 Presence of coronary angioplasty implant and graft: Secondary | ICD-10-CM

## 2016-01-03 DIAGNOSIS — I214 Non-ST elevation (NSTEMI) myocardial infarction: Principal | ICD-10-CM

## 2016-01-03 LAB — CBC
HCT: 44.8 % (ref 39.0–52.0)
Hemoglobin: 14.8 g/dL (ref 13.0–17.0)
MCH: 29.2 pg (ref 26.0–34.0)
MCHC: 33 g/dL (ref 30.0–36.0)
MCV: 88.4 fL (ref 78.0–100.0)
PLATELETS: 226 10*3/uL (ref 150–400)
RBC: 5.07 MIL/uL (ref 4.22–5.81)
RDW: 13.9 % (ref 11.5–15.5)
WBC: 16.6 10*3/uL — ABNORMAL HIGH (ref 4.0–10.5)

## 2016-01-03 LAB — COMPREHENSIVE METABOLIC PANEL
ALT: 24 U/L (ref 17–63)
ANION GAP: 11 (ref 5–15)
AST: 58 U/L — ABNORMAL HIGH (ref 15–41)
Albumin: 3.5 g/dL (ref 3.5–5.0)
Alkaline Phosphatase: 59 U/L (ref 38–126)
BUN: 13 mg/dL (ref 6–20)
CHLORIDE: 107 mmol/L (ref 101–111)
CO2: 20 mmol/L — AB (ref 22–32)
Calcium: 9.5 mg/dL (ref 8.9–10.3)
Creatinine, Ser: 1.14 mg/dL (ref 0.61–1.24)
GFR calc non Af Amer: >60 mL/min (ref >60)
Glucose, Bld: 167 mg/dL — ABNORMAL HIGH (ref 65–99)
POTASSIUM: 3.8 mmol/L (ref 3.5–5.1)
SODIUM: 138 mmol/L (ref 135–145)
Total Bilirubin: 1.4 mg/dL — ABNORMAL HIGH (ref 0.3–1.2)
Total Protein: 6.3 g/dL — ABNORMAL LOW (ref 6.5–8.1)

## 2016-01-03 LAB — POCT I-STAT 3, ART BLOOD GAS (G3+)
Bicarbonate: 23.9 mEq/L (ref 20.0–24.0)
O2 Saturation: 97 %
PCO2 ART: 36.1 mmHg (ref 35.0–45.0)
PH ART: 7.428 (ref 7.350–7.450)
PO2 ART: 85 mmHg (ref 80.0–100.0)
TCO2: 25 mmol/L (ref 0–100)

## 2016-01-03 LAB — DIFFERENTIAL
BASOS ABS: 0 10*3/uL (ref 0.0–0.1)
BASOS PCT: 0 %
EOS ABS: 0 10*3/uL (ref 0.0–0.7)
Eosinophils Relative: 0 %
LYMPHS ABS: 0.9 10*3/uL (ref 0.7–4.0)
Lymphocytes Relative: 5 %
Monocytes Absolute: 1.8 10*3/uL — ABNORMAL HIGH (ref 0.1–1.0)
Monocytes Relative: 11 %
NEUTROS PCT: 84 %
Neutro Abs: 14.2 10*3/uL — ABNORMAL HIGH (ref 1.7–7.7)

## 2016-01-03 LAB — HEMOGLOBIN A1C
HEMOGLOBIN A1C: 6.3 % — AB (ref 4.8–5.6)
Mean Plasma Glucose: 134 mg/dL

## 2016-01-03 LAB — LACTIC ACID, PLASMA: LACTIC ACID, VENOUS: 0.8 mmol/L (ref 0.5–2.0)

## 2016-01-03 LAB — GLUCOSE, CAPILLARY: Glucose-Capillary: 134 mg/dL — ABNORMAL HIGH (ref 65–99)

## 2016-01-03 LAB — AMMONIA: AMMONIA: 39 umol/L — AB (ref 9–35)

## 2016-01-03 MED ORDER — HYDRALAZINE HCL 20 MG/ML IJ SOLN
10.0000 mg | Freq: Four times a day (QID) | INTRAMUSCULAR | Status: DC | PRN
  Administered 2016-01-03 – 2016-01-10 (×5): 10 mg via INTRAVENOUS
  Filled 2016-01-03 (×5): qty 1

## 2016-01-03 MED ORDER — QUETIAPINE FUMARATE 25 MG PO TABS
25.0000 mg | ORAL_TABLET | Freq: Every day | ORAL | Status: DC
  Administered 2016-01-03 – 2016-01-10 (×8): 25 mg via ORAL
  Filled 2016-01-03 (×8): qty 1

## 2016-01-03 MED ORDER — METOPROLOL TARTRATE 12.5 MG HALF TABLET
12.5000 mg | ORAL_TABLET | Freq: Two times a day (BID) | ORAL | Status: DC
  Administered 2016-01-03 – 2016-01-10 (×14): 12.5 mg via ORAL
  Filled 2016-01-03 (×15): qty 1

## 2016-01-03 MED ORDER — QUETIAPINE 12.5 MG HALF TABLET
12.5000 mg | ORAL_TABLET | Freq: Two times a day (BID) | ORAL | Status: DC
  Administered 2016-01-04: 12.5 mg via ORAL
  Filled 2016-01-03 (×3): qty 1

## 2016-01-03 MED ORDER — IPRATROPIUM-ALBUTEROL 0.5-2.5 (3) MG/3ML IN SOLN
3.0000 mL | RESPIRATORY_TRACT | Status: DC
  Administered 2016-01-03 – 2016-01-04 (×8): 3 mL via RESPIRATORY_TRACT
  Filled 2016-01-03 (×8): qty 3

## 2016-01-03 MED ORDER — ASPIRIN EC 81 MG PO TBEC
81.0000 mg | DELAYED_RELEASE_TABLET | Freq: Every day | ORAL | Status: DC
  Administered 2016-01-03 – 2016-01-11 (×9): 81 mg via ORAL
  Filled 2016-01-03 (×9): qty 1

## 2016-01-03 MED FILL — Nitroglycerin IV Soln 100 MCG/ML in D5W: INTRA_ARTERIAL | Qty: 10 | Status: AC

## 2016-01-03 NOTE — Care Management Note (Signed)
Case Management Note  Patient Details  Name: Lee Chang MRN: [id number] Date of Birth: November 03, 1940  Subjective/Objective:        Adm w mi, very confused today            Action/Plan: lives alone, da is poa, pcp dr Selinda Flavinkevin howard   Expected Discharge Date:                  Expected Discharge Plan:  Home w Home Health Services  In-House Referral:     Discharge planning Services  CM Consult, Medication Assistance  Post Acute Care Choice:    Choice offered to:     DME Arranged:    DME Agency:     HH Arranged:    HH Agency:     Status of Service:     Medicare Important Message Given:    Date Medicare IM Given:    Medicare IM give by:    Date Additional Medicare IM Given:    Additional Medicare Important Message give by:     If discussed at Long Length of Stay Meetings, dates discussed:    Additional Comments: gave pt 30day free brilinta card.  Hanley Haysowell, Liisa Picone T, RN 01/03/2016, 10:13 AM

## 2016-01-03 NOTE — Progress Notes (Signed)
pts sbp 176 10mg  Htz given.  Reoriented pt.  Pt confused, Family at bedside.  Will continue to monitor. Lee Chang, [name] T

## 2016-01-03 NOTE — Progress Notes (Addendum)
Subjective:  POD # 2 PCI/DES X 2 Dom RCA in setting of inf non-STEMI. Pt is not alert or oriented. + Delirium   Objective:  Temp:  [98.2 F (36.8 C)-100.6 F (38.1 C)] 99 F (37.2 C) (03/13 0842) Pulse Rate:  [66-118] 98 (03/13 0842) Resp:  [17-26] 24 (03/13 0842) BP: (151-175)/(65-104) 162/65 mmHg (03/13 0842) SpO2:  [83 %-99 %] 99 % (03/13 0842) Weight:  [199 lb 14.4 oz (90.674 kg)] 199 lb 14.4 oz (90.674 kg) (03/13 0321) Weight change: 4 lb 2.1 oz (1.874 kg)  Intake/Output from previous day: 03/12 0701 - 03/13 0700 In: 535.6 [P.O.:200; I.V.:35.6; IV Piggyback:300] Out: 200 [Urine:200]  Intake/Output from this shift:    Physical Exam: General appearance: delirious and slowed mentation Neck: no adenopathy, no carotid bruit, no JVD, supple, symmetrical, trachea midline and thyroid not enlarged, symmetric, no tenderness/mass/nodules Lungs: clear to auscultation bilaterally Heart: regular rate and rhythm, S1, S2 normal, no murmur, click, rub or gallop Extremities: extremities normal, atraumatic, no cyanosis or edema and Right RA puncture site OK  Lab Results: Results for orders placed or performed during the hospital encounter of 12/31/15 (from the past 48 hour(s))  Protime-INR     Status: None   Collection Time: 01/01/16 11:42 AM  Result Value Ref Range   Prothrombin Time 14.2 11.6 - 15.2 seconds   INR 1.08 0.00 - 1.49  Troponin I     Status: Abnormal   Collection Time: 01/01/16  2:29 PM  Result Value Ref Range   Troponin I 3.19 (HH) <0.031 ng/mL    Comment:        POSSIBLE MYOCARDIAL ISCHEMIA. SERIAL TESTING RECOMMENDED. CRITICAL VALUE NOTED.  VALUE IS CONSISTENT WITH PREVIOUSLY REPORTED AND CALLED VALUE.   Brain natriuretic peptide     Status: None   Collection Time: 01/01/16  6:40 PM  Result Value Ref Range   B Natriuretic Peptide 36.1 0.0 - 100.0 pg/mL  TSH     Status: None   Collection Time: 01/01/16  6:40 PM  Result Value Ref Range   TSH 1.717 0.350  - 4.500 uIU/mL  Troponin I     Status: Abnormal   Collection Time: 01/01/16  6:40 PM  Result Value Ref Range   Troponin I 2.29 (HH) <0.031 ng/mL    Comment:        POSSIBLE MYOCARDIAL ISCHEMIA. SERIAL TESTING RECOMMENDED. CRITICAL RESULT CALLED TO, READ BACK BY AND VERIFIED WITH: J.TSOUTIS,RN 01/01/16 '@1950'$  BY V.WILKINS   POCT Activated clotting time     Status: None   Collection Time: 01/02/16 12:21 AM  Result Value Ref Range   Activated Clotting Time 281 seconds  POCT Activated clotting time     Status: None   Collection Time: 01/02/16  1:04 AM  Result Value Ref Range   Activated Clotting Time 337 seconds  Troponin I     Status: Abnormal   Collection Time: 01/02/16  1:55 AM  Result Value Ref Range   Troponin I 1.15 (HH) <0.031 ng/mL    Comment:        POSSIBLE MYOCARDIAL ISCHEMIA. SERIAL TESTING RECOMMENDED. CRITICAL VALUE NOTED.  VALUE IS CONSISTENT WITH PREVIOUSLY REPORTED AND CALLED VALUE.   Glucose, capillary     Status: None   Collection Time: 01/02/16  8:04 AM  Result Value Ref Range   Glucose-Capillary 96 65 - 99 mg/dL   Comment 1 Notify RN   Troponin I     Status: Abnormal   Collection Time: 01/02/16  8:06 AM  Result Value Ref Range   Troponin I 7.83 (HH) <0.031 ng/mL    Comment:        POSSIBLE MYOCARDIAL ISCHEMIA. SERIAL TESTING RECOMMENDED. CRITICAL VALUE NOTED.  VALUE IS CONSISTENT WITH PREVIOUSLY REPORTED AND CALLED VALUE.   Comprehensive metabolic panel     Status: Abnormal   Collection Time: 01/02/16  8:06 AM  Result Value Ref Range   Sodium 140 135 - 145 mmol/L   Potassium 3.6 3.5 - 5.1 mmol/L   Chloride 106 101 - 111 mmol/L   CO2 25 22 - 32 mmol/L   Glucose, Bld 115 (H) 65 - 99 mg/dL   BUN 18 6 - 20 mg/dL   Creatinine, Ser 1.18 0.61 - 1.24 mg/dL   Calcium 9.0 8.9 - 10.3 mg/dL   Total Protein 5.2 (L) 6.5 - 8.1 g/dL   Albumin 3.2 (L) 3.5 - 5.0 g/dL   AST 37 15 - 41 U/L   ALT 16 (L) 17 - 63 U/L   Alkaline Phosphatase 50 38 - 126 U/L   Total  Bilirubin 0.3 0.3 - 1.2 mg/dL   GFR calc non Af Amer 59 (L) >60 mL/min   GFR calc Af Amer >60 >60 mL/min    Comment: (NOTE) The eGFR has been calculated using the CKD EPI equation. This calculation has not been validated in all clinical situations. eGFR's persistently <60 mL/min signify possible Chronic Kidney Disease.    Anion gap 9 5 - 15  CBC     Status: Abnormal   Collection Time: 01/02/16  8:06 AM  Result Value Ref Range   WBC 11.1 (H) 4.0 - 10.5 K/uL   RBC 4.75 4.22 - 5.81 MIL/uL   Hemoglobin 13.8 13.0 - 17.0 g/dL   HCT 42.1 39.0 - 52.0 %   MCV 88.6 78.0 - 100.0 fL   MCH 29.1 26.0 - 34.0 pg   MCHC 32.8 30.0 - 36.0 g/dL   RDW 13.7 11.5 - 15.5 %   Platelets 208 150 - 400 K/uL  Protime-INR     Status: None   Collection Time: 01/02/16  8:06 AM  Result Value Ref Range   Prothrombin Time 13.9 11.6 - 15.2 seconds   INR 1.05 0.00 - 1.49  I-STAT 3, arterial blood gas (G3+)     Status: Abnormal   Collection Time: 01/02/16  7:51 PM  Result Value Ref Range   pH, Arterial 7.439 7.350 - 7.450   pCO2 arterial 32.0 (L) 35.0 - 45.0 mmHg   pO2, Arterial 62.0 (L) 80.0 - 100.0 mmHg   Bicarbonate 21.6 20.0 - 24.0 mEq/L   TCO2 23 0 - 100 mmol/L   O2 Saturation 92.0 %   Acid-base deficit 2.0 0.0 - 2.0 mmol/L   Patient temperature 99.2 F    Collection site RADIAL, ALLEN'S TEST ACCEPTABLE    Drawn by Operator    Sample type ARTERIAL   Glucose, capillary     Status: Abnormal   Collection Time: 01/02/16  7:53 PM  Result Value Ref Range   Glucose-Capillary 126 (H) 65 - 99 mg/dL   Comment 1 Capillary Specimen   Urinalysis, Routine w reflex microscopic (not at [facility])     Status: Abnormal   Collection Time: 01/02/16  8:03 PM  Result Value Ref Range   Color, Urine YELLOW YELLOW   APPearance CLOUDY (A) CLEAR   Specific Gravity, Urine 1.030 1.005 - 1.030   pH 6.0 5.0 - 8.0   Glucose, UA NEGATIVE NEGATIVE mg/dL  Hgb urine dipstick LARGE (A) NEGATIVE   Bilirubin Urine NEGATIVE NEGATIVE    Ketones, ur NEGATIVE NEGATIVE mg/dL   Protein, ur NEGATIVE NEGATIVE mg/dL   Nitrite NEGATIVE NEGATIVE   Leukocytes, UA NEGATIVE NEGATIVE  Urine microscopic-add on     Status: None   Collection Time: 01/02/16  8:03 PM  Result Value Ref Range   Squamous Epithelial / LPF NONE SEEN NONE SEEN   WBC, UA NONE SEEN 0 - 5 WBC/hpf   RBC / HPF TOO NUMEROUS TO COUNT 0 - 5 RBC/hpf   Bacteria, UA NONE SEEN NONE SEEN  Comprehensive metabolic panel     Status: Abnormal   Collection Time: 01/03/16 12:49 AM  Result Value Ref Range   Sodium 138 135 - 145 mmol/L   Potassium 3.8 3.5 - 5.1 mmol/L   Chloride 107 101 - 111 mmol/L   CO2 20 (L) 22 - 32 mmol/L   Glucose, Bld 167 (H) 65 - 99 mg/dL   BUN 13 6 - 20 mg/dL   Creatinine, Ser 1.14 0.61 - 1.24 mg/dL   Calcium 9.5 8.9 - 10.3 mg/dL   Total Protein 6.3 (L) 6.5 - 8.1 g/dL   Albumin 3.5 3.5 - 5.0 g/dL   AST 58 (H) 15 - 41 U/L   ALT 24 17 - 63 U/L   Alkaline Phosphatase 59 38 - 126 U/L   Total Bilirubin 1.4 (H) 0.3 - 1.2 mg/dL   GFR calc non Af Amer >60 >60 mL/min   GFR calc Af Amer >60 >60 mL/min    Comment: (NOTE) The eGFR has been calculated using the CKD EPI equation. This calculation has not been validated in all clinical situations. eGFR's persistently <60 mL/min signify possible Chronic Kidney Disease.    Anion gap 11 5 - 15  CBC     Status: Abnormal   Collection Time: 01/03/16  3:52 AM  Result Value Ref Range   WBC 16.6 (H) 4.0 - 10.5 K/uL   RBC 5.07 4.22 - 5.81 MIL/uL   Hemoglobin 14.8 13.0 - 17.0 g/dL   HCT 44.8 39.0 - 52.0 %   MCV 88.4 78.0 - 100.0 fL   MCH 29.2 26.0 - 34.0 pg   MCHC 33.0 30.0 - 36.0 g/dL   RDW 13.9 11.5 - 15.5 %   Platelets 226 150 - 400 K/uL  Glucose, capillary     Status: Abnormal   Collection Time: 01/03/16  8:41 AM  Result Value Ref Range   Glucose-Capillary 134 (H) 65 - 99 mg/dL   Comment 1 Capillary Specimen     Imaging: Imaging results have been reviewed NAD ( I personally reviewed)   Tele- SR in  (0s (I personally reviewed)  Assessment/Plan:   1. Principal Problem: 2.   NSTEMI (non-ST elevated myocardial infarction) ([place]) 3. Active Problems: 4.   CAP (community acquired pneumonia) 102.   COPD (chronic obstructive pulmonary disease) ([facility]) 6.   Lung nodule 7.   Essential hypertension 8.   Bradycardia 9.   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   Dyslipidemia, goal LDL below 70 12.   Time Spent Directly with Patient:  30 minutes  Length of Stay:  LOS: 2 days   POD # 2 Inf NSTEMI. Peak trop 7. S/P complex RCA PCI/DES X 2 with Synergy DES. There was thromboemboli mid-distal rPDA (Pt had 18 hrs Aggrastat IV) with infero- apical  WMA. EKG w/o acute changes. Exam benign except for AMS. No H/O ETOH. Head CT neg . VSS. LAbs OK. On Rocephin empirically. BC drawn. Suspect this is sundowning in an elderly pt in ICU. Will get Neuro consult and ask TRH to assist with this part of his care. Keep in stepdown for now. MBS to assess ability to swallow meds.Add BB. DAPT with Brilenta / ASA 81 mg ( Had bleeding PUD in past)  Will eventually require w/u of lung nodule found on Chest CT   Quay Burow 01/03/2016, 9:20 AM

## 2016-01-03 NOTE — Consult Note (Signed)
Requesting Physician: Dr.  Allyson SabalBerry    Reason for consultation: Altered mental status  HPI:                                                                                                                                         Lee Chang is an 75 y.o. male patient  with acute delirium. History obtained from patient's family at bedside. Patient unable to provide any further history. He is noted to be confused and talking tangentially, mild agitation noted. The symptoms are worse at nighttime.   Patient is admitted for cardiac cath for NSTEMI, post stent placement. patient lives alone at home . Denies any alcohol abuse   Past Medical History: Past Medical History  Diagnosis Date  . Ulcer   . Asthma   . Essential hypertension   . NSTEMI (non-ST elevated myocardial infarction) (HCC) 12/31/2015    Echo 01/01/16 (pre-CATH-PCI): EF 60-65%, no RWMA.   . 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 Moderate OM1 35% & midLAD 45%  . 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 Dyslipidemia, goal LDL below 70 01/02/2016  . COPD (chronic obstructive pulmonary disease) (HCC) 01/01/2016  . Lung nodule 01/01/2016  . Dementia     Past Surgical History  Procedure Laterality Date  . Hernia repair    . Cardiac catheterization N/A 01/01/2016    Procedure: Left Heart Cath and Coronary Angiography;  Surgeon: Marykay Lexavid W Harding, MD;  [facility];  Service: Cardiovascular;  Laterality: N/A;  . Cardiac catheterization Right 01/01/2016    Procedure: Coronary Stent Intervention;  Surgeon: Marykay Lexavid W Harding, MD;  [facility];  Service: Cardiovascular;  Laterality: Right;    Family History: History reviewed. No pertinent  family history.  Social History:   reports that he has never smoked. He does not have any smokeless tobacco history on file. He reports that he does not drink alcohol. His drug history is not on file.  Allergies:  Allergies  Allergen Reactions  . Asa [Aspirin]     Bleeding ulcer     Medications:  Current facility-administered medications:  .  0.9 %  sodium chloride infusion, , Intravenous, Continuous, Vesta Mixer, MD, Stopped at 01/03/16 0030 .  0.9 %  sodium chloride infusion, 250 mL, Intravenous, PRN, Marykay Lex, MD .  acetaminophen (TYLENOL) tablet 650 mg, 650 mg, Oral, Q6H PRN, 650 mg at 01/03/16 0324 **OR** acetaminophen (TYLENOL) suppository 650 mg, 650 mg, Rectal, Q6H PRN, Meredeth Ide, MD .  albuterol (PROVENTIL) (2.5 MG/3ML) 0.083% nebulizer solution 2.5 mg, 2.5 mg, Nebulization, Q2H PRN, Meredeth Ide, MD .  aspirin EC tablet 81 mg, 81 mg, Oral, Daily, Runell Gess, MD, 81 mg at 01/03/16 0950 .  atorvastatin (LIPITOR) tablet 80 mg, 80 mg, Oral, q1800, Erick Blinks, MD, 80 mg at 01/01/16 2000 .  azithromycin (ZITHROMAX) 500 mg in dextrose 5 % 250 mL IVPB, 500 mg, Intravenous, Q24H, Meredeth Ide, MD, 500 mg at 01/03/16 0138 .  cefTRIAXone (ROCEPHIN) 1 g in dextrose 5 % 50 mL IVPB, 1 g, Intravenous, Q24H, Erick Blinks, MD, 1 g at 01/03/16 0324 .  finasteride (PROSCAR) tablet 5 mg, 5 mg, Oral, Daily, Meredeth Ide, MD, 5 mg at 01/03/16 0949 .  guaiFENesin (MUCINEX) 12 hr tablet 600 mg, 600 mg, Oral, BID, Meredeth Ide, MD, 600 mg at 01/02/16 2153 .  hydrALAZINE (APRESOLINE) injection 10 mg, 10 mg, Intravenous, Q6H PRN, Leeann Must, MD, 10 mg at 01/03/16 0010 .  ipratropium-albuterol (DUONEB) 0.5-2.5 (3) MG/3ML nebulizer solution 3 mL, 3 mL, Nebulization, TID, Vesta Mixer, MD, 3 mL at 01/03/16 0821 .  losartan (COZAAR) tablet 50 mg, 50 mg, Oral,  Daily, 50 mg at 01/03/16 0950 **AND** [DISCONTINUED] hydrochlorothiazide (MICROZIDE) capsule 12.5 mg, 12.5 mg, Oral, Daily, Vesta Mixer, MD, 12.5 mg at 01/01/16 2000 .  metoprolol tartrate (LOPRESSOR) tablet 12.5 mg, 12.5 mg, Oral, BID, Runell Gess, MD, 12.5 mg at 01/03/16 0949 .  morphine 2 MG/ML injection 2 mg, 2 mg, Intravenous, Q1H PRN, Marykay Lex, MD .  nitroGLYCERIN (NITROSTAT) SL tablet 0.4 mg, 0.4 mg, Sublingual, Q5 min PRN, Vesta Mixer, MD, 0.4 mg at 01/01/16 2023 .  QUEtiapine (SEROQUEL) tablet 12.5 mg, 12.5 mg, Oral, BID, Zidan Helget Daniel Nones, MD .  QUEtiapine (SEROQUEL) tablet 25 mg, 25 mg, Oral, q1800, Vishaal Strollo Daniel Nones, MD .  sodium chloride flush (NS) 0.9 % injection 3 mL, 3 mL, Intravenous, Q12H, Meredeth Ide, MD, 3 mL at 01/02/16 1100 .  sodium chloride flush (NS) 0.9 % injection 3 mL, 3 mL, Intravenous, Q12H, Joellyn Rued, MD, 3 mL at 01/03/16 1002 .  sodium chloride flush (NS) 0.9 % injection 3 mL, 3 mL, Intravenous, Q12H, Marykay Lex, MD, 3 mL at 01/03/16 0959 .  sodium chloride flush (NS) 0.9 % injection 3 mL, 3 mL, Intravenous, PRN, Marykay Lex, MD .  tamsulosin [facility]) capsule 0.8 mg, 0.8 mg, Oral, Daily, Meredeth Ide, MD, 0.8 mg at 01/03/16 0950 .  ticagrelor (BRILINTA) tablet 90 mg, 90 mg, Oral, BID, Marykay Lex, MD, Stopped at 01/03/16 1007 .  tiotropium (SPIRIVA) inhalation capsule 18 mcg, 1 capsule, Inhalation, Q24H, Joellyn Rued, MD   ROS:  History    the patient and unobtainable from patient due to mental status     Neurologic Examination:                                                                                                    Today's Vitals   01/03/16 0321 01/03/16 0821 01/03/16 0842 01/03/16 0949  BP: 164/77  162/65   Pulse: 105  98 98  Temp:   99 F (37.2 C)    TempSrc:   Oral   Resp: 26  24   Height:      Weight: 90.674 kg (199 lb 14.4 oz)     SpO2: 98% 99% 99%   PainSc:        Evaluation of higher integrative functions including: Level of alertness: Alert,  Not Oriented to time, or place, oriented to person Speech: fluent, no evidence of dysarthria or aphasia noted.  Test the following cranial nerves: 2-12 grossly intact Motor examination: Normal tone, bulk, full 5/5 motor strength in all 4 extremities Examination of sensation : Normal and symmetric sensation to pinprick in all 4 extremities and on face Examination of deep tendon reflexes: 2+, normal and symmetric in all extremities, normal plantars bilaterally Test coordination: Normal finger nose testing, with no evidence of limb appendicular ataxia or abnormal involuntary movements or tremors noted.  Gait: Deferred  Lab Results: Basic Metabolic Panel:  Recent Labs Lab 12/31/15 2227 01/01/16 0449 01/02/16 0806 01/03/16 0049  NA 139 140 140 138  K 3.3* 3.5 3.6 3.8  CL 104 104 106 107  CO2 20*  GLUCOSE 183* 106* 115* 167*  BUN 25* 23* 18 13  CREATININE 1.35* 1.14 1.18 1.14  CALCIUM 9.4 9.2 9.0 9.5    Liver Function Tests:  Recent Labs Lab 12/31/15 2227 01/01/16 0449 01/02/16 0806 01/03/16 0049  AST 18 15 37 58*  ALT 17 14* 16* 24  ALKPHOS 62 56 50 59  BILITOT 0.5 0.7 0.3 1.4*  PROT 6.8 6.1* 5.2* 6.3*  ALBUMIN 3.9 3.5 3.2* 3.5   No results for input(s): LIPASE, AMYLASE in the last 168 hours. No results for input(s): AMMONIA in the last 168 hours.  CBC:  Recent Labs Lab 12/31/15 2227 01/01/16 0449 01/02/16 0806 01/03/16 0352  WBC 13.7* 11.0* 11.1* 16.6*  NEUTROABS 10.3*  --   --   --   HGB 16.0 14.5 13.8 14.8  HCT 47.0 44.0 42.1 44.8  MCV 89.5 90.2 88.6 88.4  PLT 216 220 208 226    Cardiac Enzymes:  Recent Labs Lab 01/01/16 0857 01/01/16 1429 01/01/16 1840 01/02/16 0155 01/02/16 0806  TROPONINI 2.79* 3.19* 2.29* 1.15* 7.83*     Lipid Panel: No results for input(s): CHOL, TRIG, HDL, CHOLHDL, VLDL, LDLCALC in the last 168 hours.  CBG:  Recent Labs Lab 01/02/16 0804 01/02/16 1953 01/03/16 0841  GLUCAP 96 126* 134*    Microbiology: Results for orders placed or performed during the hospital encounter of 12/31/15  Blood culture (routine x 2)     Status: None (Preliminary result)   Collection Time:  01/01/16  1:14 AM  Result Value Ref Range Status   Specimen Description RIGHT ANTECUBITAL  Final   Special Requests BOTTLES DRAWN AEROBIC ONLY 6CC  Final   Culture NO GROWTH 2 DAYS  Final   Report Status PENDING  Incomplete  Blood culture (routine x 2)     Status: None (Preliminary result)   Collection Time: 01/01/16  1:25 AM  Result Value Ref Range Status   Specimen Description LEFT ANTECUBITAL  Final   Special Requests BOTTLES DRAWN AEROBIC AND ANAEROBIC 6CC  Final   Culture NO GROWTH 2 DAYS  Final   Report Status PENDING  Incomplete  MRSA PCR Screening     Status: None   Collection Time: 01/01/16  2:45 AM  Result Value Ref Range Status   MRSA by PCR NEGATIVE NEGATIVE Final    Comment:        The GeneXpert MRSA Assay (FDA approved for NASAL specimens only), is one component of a comprehensive MRSA colonization surveillance program. It is not intended to diagnose MRSA infection nor to guide or monitor treatment for MRSA infections.      Imaging: Ct Head Wo Contrast  01/03/2016  CLINICAL DATA:  Delirium, altered mental status. Recent myocardial infarction, history of hypertension and dyslipidemia. EXAM: CT HEAD WITHOUT CONTRAST TECHNIQUE: Contiguous axial images were obtained from the base of the skull through the vertex without intravenous contrast. COMPARISON:  MRI of the head August 20, 2015 FINDINGS: The ventricles and sulci are normal for age. No intraparenchymal hemorrhage, mass effect nor midline shift. Patchy supratentorial white matter hypodensities are less than expected for patient's  age and though non-specific suggest sequelae of chronic small vessel ischemic disease. No acute large vascular territory infarcts. Old LEFT basal ganglia lacunar infarct. No abnormal extra-axial fluid collections. Basal cisterns are patent. 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 No skull fracture. The included ocular globes and orbital contents are non-suspicious. Status post bilateral ocular lens implants. Mild paranasal sinus mucosal thickening. Mastoid air cells are well aerated. Patient is edentulous. IMPRESSION: No acute intracranial process. Involutional changes and minimal chronic small vessel ischemic disease. Old LEFT basal ganglia lacunar infarct. Electronically Signed   By: Awilda Metro M.D.   On: 01/03/2016 01:46   Dg Chest Port 1 View  01/03/2016  CLINICAL DATA:  Hypoxemia, confusion, fever, asthma, essential hypertension, coronary artery disease post NSTEMI, COPD EXAM: PORTABLE CHEST 1 VIEW COMPARISON:  Portable exam 1007 hours compared to 08/18/2015 FINDINGS: Normal heart size, mediastinal contours, and pulmonary vascularity. Bronchitic changes accentuated interstitial markings. Bibasilar atelectasis versus infiltrate. Upper lungs clear. No pleural effusion or pneumothorax. IMPRESSION: Bronchitic changes with bibasilar atelectasis versus infiltrate. Electronically Signed   By: Ulyses Southward M.D.   On: 01/03/2016 10:19   Ct Angio Chest Aorta W/cm &/or Wo/cm  01/01/2016  CLINICAL DATA:  Sudden onset of central chest pain radiating to the back. Rule out dissection. EXAM: CT ANGIOGRAPHY CHEST, ABDOMEN AND PELVIS TECHNIQUE: Multidetector CT imaging through the chest, abdomen and pelvis was performed using the standard protocol during bolus administration of intravenous contrast. Multiplanar reconstructed images and MIPs were obtained and reviewed to evaluate the vascular anatomy. CONTRAST:  OMNIPAQUE IOHEXOL 350 MG/ML SOLN COMPARISON:  None. FINDINGS: CTA CHEST FINDINGS  Normal caliber thoracic aorta without dissection, aneurysm, hematoma or acute aortic syndrome. Mild atherosclerosis. Conventional branching pattern from the aortic arch. No filling defects in the pulmonary arteries to the segmental level. Heart is normal in size. Coronary artery calcification versus stent.  Scattered normal size mediastinal nodes, no pathologic adenopathy. No hilar adenopathy. Lobular right apical nodule measures 1.5 x 1.3 x 2.2 cm with minimal surrounding spiculation. No additional suspicious pulmonary nodule. Faint reticular nodular subpleural opacity in the anterior right upper lobe likely infectious or inflammatory. No consolidation or pulmonary edema. Sub cm lucency within T4 vertebral body, nonspecific. No sclerotic lesions. No acute osseous abnormality. Review of the MIP images confirms the above findings. CTA ABDOMEN AND PELVIS FINDINGS Normal caliber abdominal aorta without aneurysm, dissection, hematoma or acute aortic abnormality. Moderate diffuse atherosclerosis. Celiac, superior mesenteric, and inferior mesenteric arteries are patent. Single bilateral renal arteries are patent. Atherosclerosis of both iliac arteries without significant stenosis. Arterial phase imaging of the liver, spleen, and pancreas are normal. No adrenal nodule. Symmetric renal enhancement. Exophytic lower pole cyst on the left measures 2.1 cm. Stomach distended with ingested contents. There are no dilated or thickened bowel loops. Distal small bowel loops are fluid-filled. Colonic tortuosity with small to moderate stool burden. No colonic wall thickening. Small retroperitoneal lymph nodes, not enlarged by size criteria. No pelvic adenopathy. Enlarged prostate gland measuring 6.6 cm transverse with mass effect on the bladder base. Bladder physiologically distended. There is fat in the right inguinal canal. There are no acute or suspicious osseous abnormalities. Review of the MIP images confirms the above findings.  IMPRESSION: 1. Normal caliber thoracoabdominal aorta without dissection or acute aortic abnormality. Moderate atherosclerosis. 2. Lobular right apical pulmonary nodule measures 2.2 x 1.5 x 1.3 cm with minimal spiculation, concerning for primary bronchogenic neoplasm. There is no adenopathy. Other than a subcentimeter lucency in T4 which is nonspecific, no evidence of metastatic disease on this arterial phase imaging exams. This bone lucency may simply represent normal variation. Recommend PET-CT characterization. 3. Faint reticulonodular opacity in the subpleural right lower lobe is likely infectious or inflammatory. 4. No acute abnormality in the abdomen/pelvis. Prostatomegaly is incidentally noted. Electronically Signed   By: Rubye Oaks M.D.   On: 01/01/2016 00:47   Ct Cta Abd/pel W/cm &/or W/o Cm  01/01/2016  CLINICAL DATA:  Sudden onset of central chest pain radiating to the back. Rule out dissection. EXAM: CT ANGIOGRAPHY CHEST, ABDOMEN AND PELVIS TECHNIQUE: Multidetector CT imaging through the chest, abdomen and pelvis was performed using the standard protocol during bolus administration of intravenous contrast. Multiplanar reconstructed images and MIPs were obtained and reviewed to evaluate the vascular anatomy. CONTRAST:  OMNIPAQUE IOHEXOL 350 MG/ML SOLN COMPARISON:  None. FINDINGS: CTA CHEST FINDINGS Normal caliber thoracic aorta without dissection, aneurysm, hematoma or acute aortic syndrome. Mild atherosclerosis. Conventional branching pattern from the aortic arch. No filling defects in the pulmonary arteries to the segmental level. Heart is normal in size. Coronary artery calcification versus stent. Scattered normal size mediastinal nodes, no pathologic adenopathy. No hilar adenopathy. Lobular right apical nodule measures 1.5 x 1.3 x 2.2 cm with minimal surrounding spiculation. No additional suspicious pulmonary nodule. Faint reticular nodular subpleural opacity in the anterior right upper  lobe likely infectious or inflammatory. No consolidation or pulmonary edema. Sub cm lucency within T4 vertebral body, nonspecific. No sclerotic lesions. No acute osseous abnormality. Review of the MIP images confirms the above findings. CTA ABDOMEN AND PELVIS FINDINGS Normal caliber abdominal aorta without aneurysm, dissection, hematoma or acute aortic abnormality. Moderate diffuse atherosclerosis. Celiac, superior mesenteric, and inferior mesenteric arteries are patent. Single bilateral renal arteries are patent. Atherosclerosis of both iliac arteries without significant stenosis. Arterial phase imaging of the liver, spleen, and pancreas are normal.  No adrenal nodule. Symmetric renal enhancement. Exophytic lower pole cyst on the left measures 2.1 cm. Stomach distended with ingested contents. There are no dilated or thickened bowel loops. Distal small bowel loops are fluid-filled. Colonic tortuosity with small to moderate stool burden. No colonic wall thickening. Small retroperitoneal lymph nodes, not enlarged by size criteria. No pelvic adenopathy. Enlarged prostate gland measuring 6.6 cm transverse with mass effect on the bladder base. Bladder physiologically distended. There is fat in the right inguinal canal. There are no acute or suspicious osseous abnormalities. Review of the MIP images confirms the above findings. IMPRESSION: 1. Normal caliber thoracoabdominal aorta without dissection or acute aortic abnormality. Moderate atherosclerosis. 2. Lobular right apical pulmonary nodule measures 2.2 x 1.5 x 1.3 cm with minimal spiculation, concerning for primary bronchogenic neoplasm. There is no adenopathy. Other than a subcentimeter lucency in T4 which is nonspecific, no evidence of metastatic disease on this arterial phase imaging exams. This bone lucency may simply represent normal variation. Recommend PET-CT characterization. 3. Faint reticulonodular opacity in the subpleural right lower lobe is likely  infectious or inflammatory. 4. No acute abnormality in the abdomen/pelvis. Prostatomegaly is incidentally noted. Electronically Signed   By: Rubye Oaks M.D.   On: 01/01/2016 00:47      Assessment and plan:   Lee Chang is an 75 y.o. male patient with  acute delirium, unknown etiology at this time. Possible deconditioning of a baseline cognitive impairment in the setting of acute hospitalization.  He has history of dementia and has been started on Exelon patch previously by his PCP. Recommend to discontinue Exelon patch. We'll start small dose of Seroquel 12.5 mg in the morning, afternoon and 25 mg in the evening. EEG to rule out seizures. EEG showed evidence of encephalopathy, no seizures. CT brain showed no acute pathology.   We'll follow-up

## 2016-01-03 NOTE — Progress Notes (Signed)
Bedside EEG completed; results pending. 

## 2016-01-03 NOTE — Progress Notes (Signed)
Md made aware of lab results and head CT completed.  Family at bedside. Bed alarm on.  Will continue to monitor. Lee Chang, [name] T

## 2016-01-03 NOTE — Consult Note (Signed)
[facility] Medical Consultation  Lee Chang [place] ZOX:[id number] DOB: [birth year] DOA: 12/31/2015 PCP: [name], MD   Requesting physician: Dr. 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 CT head is negative. ABGs negative.Lactic acid pending BCx , Urine culture BMET and CBG monitoring Ammonia levels   LUE ultrasound r/o emboli to evaluate swelling. Neuro consult is pending   CAD, NSTEMI  S/p R cardiac catheterization due to thromboemboli mid-distal rPDA (Pt had 18 hrs Aggrastat IV) with infero- apical wall motion abnormality. 2 D Echo Nl LVF, EF of 60-65% EKG w/o acute changes.     Hypertension BP 162/65 mmHg  Pulse 98  Temp(Src) 99 F (37.2 C) (Oral)  Resp 24  Ht  (1.727 m)  Wt 90.674 kg (199 lb 14.4 oz)  BMI 30.40 kg/m2  SpO2 99% Plan as per Cards Monitor step down telemetry  COPD exacerbation.   Recommend to continue nebulizer treatment qid prn   Left lung nodule Ct chest 3/11 showed Lobular right apical pulmonary nodule measures 2.2 x 1.5 x 1.3 cm with minimal spiculation, concerning for primary bronchogenic neoplasm. There is no adenopathy. Other than a subcentimeter lucency in T4 which is nonspecific, no evidence of metastatic disease For PET as  OP.   Left arm swelling with ecchymoses. These findings are new, could be due to recent anticoagulation during cath Check LUE [place] as above.   Leukocytosis, may be acute on chronic, likely reactive, related to underlying infection, inflammation due to post op, fever, in the setting of COPD-asthma. WBCs dating back to 2011 show WBC  Bet 11-12 and as high as 17,000.  Cultures pending   Repeat WBC in AM  Dementia Continue on home meds once passing swallow evaluation  All other medical issues as per Cards   HPI: 75 year old man with a history of dementia, COPD, hypertension, recent lung nodule. CT of the chest, and a history of CAD, Transferred from [facility], After presenting with substernal chest pain radiating through the back, ruling in for ACS with troponins of 2.79. Prior to presentation, he had been diagnosed with vs bronchitis and was placed on  azithromycin IV.  He was brought here for cardiac catheterization on 01/02/2016. He received Aggrastat, ASA and prior to that, Lovenox in [employer]. He tolerated the procedure well, however, later, he developed  confusion, agitated, unable to engage in conversation.No history of alcohol or recreational drugs. He does not smoke. No recent treatments or sick contacts. Daughter reports that he has been having trouble swallowing as well. In addition, his left arm became more swollen, and developed erythema and ecchymosis. He also had mild tremor on his upper extremities,  these symptoms are new. No apparent acute bleeding issues such as hematuria, hematemesis, hematochezia or epistaxis.  He was febrile, Tmax 100.6,UA was negative. Cultures were drawn, currently  pending. ABG  And CT of the head are normal. CXR unremarkable. Of note, Bili is mildly elevated at 1.4. Other labs show elevated WBC at 16.6  Of note, Neurology evaluation is pending as well. IM team has been requested to see this patient in consultation regarding confusion to help in its  management Review of Systems: Unable to obtain from patient, due to mental status changes. .   Past Medical History  Diagnosis Date  . Ulcer   . Asthma   . Essential hypertension   . NSTEMI (non-ST elevated myocardial infarction) (HCC) 12/31/2015    Echo 01/01/16 (pre-CATH-PCI): EF 60-65%, no RWMA.   . 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 Moderate OM1 35% & midLAD 45%  . 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 Dyslipidemia, goal LDL below 70 01/02/2016  . COPD (chronic obstructive pulmonary disease) (HCC) 01/01/2016  . Lung nodule 01/01/2016  . Dementia    Past Surgical History  Procedure Laterality Date  . Hernia repair    . Cardiac catheterization N/A 01/01/2016    Procedure: Left Heart Cath and Coronary Angiography;  Surgeon: Marykay Lexavid W Harding, MD;  [facility];  Service: Cardiovascular;  Laterality: N/A;  . Cardiac catheterization Right 01/01/2016    Procedure: Coronary Stent Intervention;  Surgeon: Marykay Lexavid W Harding, MD;  [facility];  Service: Cardiovascular;  Laterality: Right;   Social History:  reports that he has never smoked. He does not have any smokeless tobacco history on file. He reports that he does not drink alcohol. His drug history is not on file.  Allergies  Allergen Reactions  . Asa [Aspirin]     Bleeding ulcer   History reviewed. No pertinent family history.  Prior to Admission medications   Medication Sig Start Date End Date Taking? Authorizing Provider  CALCIUM-MAGNESIUM-ZINC PO Take 1 tablet by mouth at bedtime.   Yes Historical Provider, MD  finasteride (PROSCAR) 5 MG tablet Take 5 mg by mouth daily.  06/09/14  Yes Historical Provider, MD  Fish  Oil-Cholecalciferol (FISH OIL + D3 PO) Take 1 capsule by mouth at bedtime.   Yes Historical Provider, MD  hydrochlorothiazide (HYDRODIURIL) 25 MG tablet Take 25 mg by mouth daily.   Yes Historical Provider, MD  losartan-hydrochlorothiazide (HYZAAR) 50-12.5 MG tablet Take 1 tablet by mouth daily. 12/03/15  Yes Historical Provider, MD  rivastigmine (EXELON) 4.6 mg/24hr Place 1 patch onto the skin daily. 12/06/15  Yes Historical Provider, MD  SPIRIVA HANDIHALER 18 MCG inhalation capsule Place 1 capsule into inhaler and inhale daily. 12/03/15  Yes Historical Provider, MD  tamsulosin (FLOMAX) 0.4 MG CAPS capsule Take 0.8 mg by mouth at bedtime.  06/09/14  Yes Historical Provider, MD  VENTOLIN HFA 108 (90 BASE) MCG/ACT inhaler Inhale 2 puffs into the lungs every 6 (six) hours as needed for wheezing or shortness of breath.  06/30/14  Yes Historical Provider, MD   Blood pressure 162/65, pulse 98, temperature 99 F (37.2 C), temperature source Oral, resp. rate 24, height 5\' 8"  (1.727 m), weight 90.674 kg (199 lb 14.4 oz), SpO2 99 %.  Filed Vitals:   01/03/16 0842 01/03/16 0949  BP: 162/65   Pulse: 98  98  Temp: 99 F (37.2 C)   Resp: 24    Physical Exam: General:  Confused, awake, intermittent agitation HEENT:  appear Normal, Conjunctivae clear, PERRLA. Dry Oral Mucosa. Neck: Supple Neck, No JVD, No cervical lymphadenopathy appreciated, No Carotid Bruits. Lung: +wheezing diffusely,no rhonchi or rales  Cardio: RRR, No Gallops, Rubs or Murmurs, No Parasternal Heave. Abdomen: Positive Bowel Sounds, Abdomen Soft, No tenderness, No organomegaly appreciated,No apparent rebound -guarding or rigidity. Skin: Left arm with significant echymmoses and swelling, no open lesions.  Musculoskeletal: Good muscle tone, joints appear normal, no effusions, Normal ROM. Lymph: No Palpable Lymph Nodes in Neck or Axillae Psych : unable to assess due to confusion  Neuro: Cannot assess, due to AMS, cannot follow commands.     Labs on Admission:  Basic Metabolic Panel:  Recent Labs Lab 12/31/15 2227 01/01/16 0449 01/02/16 0806 01/03/16 0049  NA 139 140 140 138  K 3.3* 3.5 3.6 3.8  CL 104 104 106 107  CO2 20*  GLUCOSE 183* 106* 115* 167*  BUN 25* 23* 18 13  CREATININE 1.35* 1.14 1.18 1.14  CALCIUM 9.4 9.2 9.0 9.5   Liver Function Tests:  Recent Labs Lab 12/31/15 2227 01/01/16 0449 01/02/16 0806 01/03/16 0049  AST 18 15 37 58*  ALT 17 14* 16* 24  ALKPHOS 62 56 50 59  BILITOT 0.5 0.7 0.3 1.4*  PROT 6.8 6.1* 5.2* 6.3*  ALBUMIN 3.9 3.5 3.2* 3.5   No results for input(s): LIPASE, AMYLASE in the last 168 hours. No results for input(s): AMMONIA in the last 168 hours. CBC:  Recent Labs Lab 12/31/15 2227 01/01/16 0449 01/02/16 0806 01/03/16 0352  WBC 13.7* 11.0* 11.1* 16.6*  NEUTROABS 10.3*  --   --   --   HGB 16.0 14.5 13.8 14.8  HCT 47.0 44.0 42.1 44.8  MCV 89.5 90.2 88.6 88.4  PLT 216 220 208 226   Cardiac Enzymes:  Recent Labs Lab 01/01/16 0857 01/01/16 1429 01/01/16 1840 01/02/16 0155 01/02/16 0806  TROPONINI 2.79* 3.19* 2.29* 1.15* 7.83*   BNP: Invalid input(s): POCBNP CBG:  Recent Labs Lab 01/02/16 0804 01/02/16 1953 01/03/16 0841  GLUCAP 96 126* 134*    Radiological Exams on Admission: Ct Head Wo Contrast  01/03/2016  CLINICAL DATA:  Delirium, altered mental status. Recent myocardial infarction, history of hypertension and dyslipidemia. EXAM: CT HEAD WITHOUT CONTRAST TECHNIQUE: Contiguous axial images were obtained from the base of the skull through the vertex without intravenous contrast. COMPARISON:  MRI of the head August 20, 2015 FINDINGS: The ventricles and sulci are normal for age. No intraparenchymal hemorrhage, mass effect nor midline shift. Patchy supratentorial white matter hypodensities are less than expected for patient's age and though non-specific suggest sequelae of chronic small vessel ischemic disease. No acute large vascular  territory infarcts. Old LEFT basal ganglia lacunar infarct. No abnormal extra-axial fluid collections. Basal cisterns are patent. 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 No skull fracture. The included ocular globes and orbital contents are non-suspicious. Status post bilateral ocular lens implants. Mild paranasal sinus mucosal thickening. Mastoid air cells are well aerated. Patient is edentulous. IMPRESSION: No acute intracranial process. Involutional changes and minimal chronic small vessel ischemic disease. Old LEFT basal ganglia lacunar infarct. Electronically Signed   By: Awilda Metro M.D.   On: 01/03/2016 01:46     [facility]  If 7PM-7AM, please contact night-coverage [email or web address] Password [facility] 01/03/2016, 10:16 AM

## 2016-01-03 NOTE — Evaluation (Signed)
Clinical/Bedside Swallow Evaluation Patient Details  Name: Lee Chang MRN: [id number] Date of Birth: [birth year]  Today's Date: 01/03/2016 Time: SLP Start Time (ACUTE ONLY): 1436 SLP Stop Time (ACUTE ONLY): 1453 SLP Time Calculation (min) (ACUTE ONLY): 17 min  Past Medical History:  Past Medical History  Diagnosis Date  . Ulcer   . Asthma   . Essential hypertension   . NSTEMI (non-ST elevated myocardial infarction) (HCC) 12/31/2015    Echo 01/01/16 (pre-CATH-PCI): EF 60-65%, no RWMA.   . 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 Moderate OM1 35% & midLAD 45%  . 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 Dyslipidemia, goal LDL below 70 01/02/2016  . COPD (chronic obstructive pulmonary disease) (HCC) 01/01/2016  . Lung nodule 01/01/2016  . Dementia    Past Surgical History:  Past Surgical History  Procedure Laterality Date  . Hernia repair    . Cardiac catheterization N/A 01/01/2016    Procedure: Left Heart Cath and Coronary Angiography;  Surgeon: Marykay Lex, MD;  Location: [facility] INVASIVE CV LAB;  Service: Cardiovascular;  Laterality: N/A;  . Cardiac catheterization Right 01/01/2016    Procedure: Coronary Stent Intervention;  Surgeon: Marykay Lex, MD;  Location: [facility]VASIVE CV LAB;  Service: Cardiovascular;  Laterality: Right;   HPI:  75 y.o. male with a Past Medical History of peptic ulcer disease, COPD, and NSTEMI, right lung nodule, HLD, dementia who presented on 01/01/2015 with chest pain and pneumonia. Patient underwent cardiac catheterization with placement of DES on 01/02/2016.    Assessment / Plan / Recommendation Clinical Impression  Pt has a cognitively-based oral dysphagia with reduced bolus awareness  resulting in oral holding with thin liquids. Mod cues provided for oral acceptance and transit of boluses. Despite Max cues for mastication, pt would expectorate all soft solids without any attempts at manipulation.   He appears to have reduced hyolaryngeal movement upon palpation, followed by delayed throat clearing with thin liquids that is not reduced with SLP manipulation of bolus size and presentation. No overt s/s of aspiration observed with nectar thick liquids or purees. Recommend to initiate Dys 1 diet and nectar thick liquids under full supervision due to AMS. Would hold POs if coughing ensues. Will continue to follow for tolerance and advancement pending improved mentation.    Aspiration Risk  Mild aspiration risk;Moderate aspiration risk    Diet Recommendation Dysphagia 1 (Puree);Nectar-thick liquid   Liquid Administration via: Cup;No straw Medication Administration: Crushed with puree Supervision: Patient able to self feed;Staff to assist with self feeding;Full supervision/cueing for compensatory strategies Compensations: Minimize environmental distractions;Small sips/bites;Slow rate;Follow solids with liquid Postural Changes: Seated upright at 90 degrees;Remain upright for at least 30 minutes after po intake    Other  Recommendations Oral Care Recommendations: Oral care BID Other Recommendations: Order thickener from pharmacy;Prohibited food (jello, ice cream, thin soups);Remove water pitcher;Have oral suction available   Follow up Recommendations   (tba)    Frequency and Duration min 2x/week  2 weeks       Prognosis Prognosis for Safe Diet Advancement: Good Barriers to Reach Goals: Other (Comment);Cognitive deficits      Swallow Study   General HPI: 75 y.o. male with a Past  Medical History of peptic ulcer disease, COPD, and NSTEMI, right lung nodule, HLD, dementia who presented on 01/01/2015 with chest pain and pneumonia. Patient underwent cardiac catheterization with  placement of DES on 01/02/2016.  Type of Study: Bedside Swallow Evaluation Previous Swallow Assessment: none in chart Diet Prior to this Study: Thin liquids (clear liquid diet) Temperature Spikes Noted: Yes (100.6) Respiratory Status: Room air History of Recent Intubation: No Behavior/Cognition: Alert;Cooperative;Confused;Requires cueing Oral Cavity Assessment: Within Functional Limits Oral Care Completed by SLP: No Oral Cavity - Dentition: Dentures, top;Dentures, bottom Self-Feeding Abilities: Needs assist Patient Positioning: Upright in bed Baseline Vocal Quality: Normal Volitional Cough: Strong Volitional Swallow:  (inconsistently able to elicit due to AMS)    Oral/Motor/Sensory Function Overall Oral Motor/Sensory Function: Within functional limits   Ice Chips Ice chips: Impaired Presentation: Spoon Pharyngeal Phase Impairments: Decreased hyoid-laryngeal movement Other Comments: pt reports it "wants to get hung"   Thin Liquid Thin Liquid: Impaired Presentation: Cup;Self Fed;Straw Oral Phase Functional Implications: Oral holding Pharyngeal  Phase Impairments: Decreased hyoid-laryngeal movement;Throat Clearing - Delayed    Nectar Thick Nectar Thick Liquid: Impaired Presentation: Cup;Self Fed Pharyngeal Phase Impairments: Decreased hyoid-laryngeal movement   Honey Thick Honey Thick Liquid: Not tested   Puree Puree: Impaired Presentation: Spoon Pharyngeal Phase Impairments: Decreased hyoid-laryngeal movement   Solid   GO   Solid: Not tested        Maxcine HamLaura Paiewonsky, M.A. CCC-SLP [phone]  Maxcine Hamaiewonsky, Janasia Coverdale 01/03/2016,3:10 PM

## 2016-01-03 NOTE — Progress Notes (Signed)
*  PRELIMINARY RESULTS* Vascular Ultrasound Left upper extremity venous duplex has been completed.  Preliminary findings: No evidence of DVT or superficial thrombosis.   Farrel DemarkJill Eunice, RDMS, RVT  01/03/2016, 1:57 PM

## 2016-01-03 NOTE — Procedures (Signed)
ELECTROENCEPHALOGRAM REPORT  Date of Study: 01/03/2016  Patient's Name: Lee Chang MRN: [id number] Date of Birth: May 02, 1941  Referring Provider: Arie SabinaKaveer Nandigam, MD  Indication: 75 year old man with dementia presenting with acute encephalopathy  Medications: Seroquel Metoprolol Morphine Brilinta Zithromax Rocephin Hydralazine Lipitor Proscar  Technical Summary: This is a multichannel digital EEG recording, using the international 10-20 placement system with electrodes applied with paste and impedances below 5000 ohms.    Description: The EEG background is symmetric but diffusely slow with delta and admixed theta, which is reactive to eye opening and closing.  Diffuse beta activity is seen, with a bilateral frontal preponderance.  No focal abnormalities are seen.  No focal or generalized epileptiform discharges are seen.  Stage II sleep is not seen.  Hyperventilation and photic stimulation were not performed  ECG revealed normal cardiac rate and rhythm.  Impression: This is an abnormal routine EEG due to generalized slowing.  This is a nonspecific finding indicative of diffuse cerebral dysfunction, which may be due to toxic-metabolic, infectious, pharmacologic or other diffuse physiologic etiology.  Lee Chang R. Everlena CooperJaffe, DO

## 2016-01-03 NOTE — Progress Notes (Signed)
1030 Noted pt has been confused. Will continue to follow pt and begin seeing when he is ready to walk. Luetta NuttingCharlene Excell Neyland RN BSN 01/03/2016 10:59 AM   01/03/2016 10:58 AM

## 2016-01-04 DIAGNOSIS — J438 Other emphysema: Secondary | ICD-10-CM

## 2016-01-04 DIAGNOSIS — R400 Somnolence: Secondary | ICD-10-CM

## 2016-01-04 LAB — CBC
HEMATOCRIT: 44.2 % (ref 39.0–52.0)
Hemoglobin: 14.5 g/dL (ref 13.0–17.0)
MCH: 29.3 pg (ref 26.0–34.0)
MCHC: 32.8 g/dL (ref 30.0–36.0)
MCV: 89.3 fL (ref 78.0–100.0)
Platelets: 207 10*3/uL (ref 150–400)
RBC: 4.95 MIL/uL (ref 4.22–5.81)
RDW: 14.3 % (ref 11.5–15.5)
WBC: 17.5 10*3/uL — AB (ref 4.0–10.5)

## 2016-01-04 LAB — GLUCOSE, CAPILLARY: Glucose-Capillary: 126 mg/dL — ABNORMAL HIGH (ref 65–99)

## 2016-01-04 MED ORDER — TIOTROPIUM BROMIDE MONOHYDRATE 18 MCG IN CAPS: 1.0000 | ORAL_CAPSULE | RESPIRATORY_TRACT | Status: DC

## 2016-01-04 MED ORDER — IPRATROPIUM-ALBUTEROL 0.5-2.5 (3) MG/3ML IN SOLN
3.0000 mL | Freq: Three times a day (TID) | RESPIRATORY_TRACT | Status: DC
  Administered 2016-01-05 – 2016-01-07 (×9): 3 mL via RESPIRATORY_TRACT
  Filled 2016-01-04 (×10): qty 3

## 2016-01-04 MED ORDER — STARCH (THICKENING) PO POWD
ORAL | Status: DC | PRN
  Filled 2016-01-04: qty 227

## 2016-01-04 MED ORDER — SODIUM CHLORIDE 0.9 % IV SOLN
INTRAVENOUS | Status: DC
  Administered 2016-01-04 – 2016-01-05 (×2): via INTRAVENOUS

## 2016-01-04 MED ORDER — SENNOSIDES-DOCUSATE SODIUM 8.6-50 MG PO TABS
1.0000 | ORAL_TABLET | Freq: Two times a day (BID) | ORAL | Status: DC
  Administered 2016-01-05 – 2016-01-06 (×3): 1 via ORAL
  Filled 2016-01-04 (×5): qty 1

## 2016-01-04 NOTE — Progress Notes (Signed)
Patient refusing to take meds in applesauce at 2100. Tried again at 2200 and 2300. Patient took most of brilinta crushed in applesauce and spit out the rest along with metoprolol. HR 110-120s, BP 140/72. Patient very agitated and confused. Paged cards, no new orders at this time. Will continue to monitor and use PRN hydralazine if needed pressure exceeds 160 systolic.

## 2016-01-04 NOTE — Progress Notes (Addendum)
Came to ambulate however pt still confused. Has been somewhat combative with RN when she changed his gown. Sleeping soundly right now. Will hold today. Ethelda ChickKristan Shyteria Lewis CES, ACSM 1:47 PM 01/04/2016

## 2016-01-04 NOTE — Progress Notes (Signed)
Subjective:  POD #3 complex RCA PCI/ DES X2 in setting of NSTEMI. Pt is more alert and oriented this AM. No CP/SOB  Objective:  Temp:  [98.2 F (36.8 C)-99.8 F (37.7 C)] 98.2 F (36.8 C) (03/14 0753) Pulse Rate:  [81-98] 95 (03/14 0827) Resp:  [17-29] 20 (03/14 0827) BP: (137-163)/(73-80) 163/77 mmHg (03/14 0827) SpO2:  [94 %-98 %] 98 % (03/14 0827) Weight:  [190 lb 3.2 oz (86.274 kg)] 190 lb 3.2 oz (86.274 kg) (03/14 0500) Weight change: -9 lb 11.2 oz (-4.4 kg)  Intake/Output from previous day: 03/13 0701 - 03/14 0700 In: 300 [IV Piggyback:300] Out: 475 [Urine:475]  Intake/Output from this shift:    Physical Exam: General appearance: alert and no distress Neck: no adenopathy, no carotid bruit, no JVD, supple, symmetrical, trachea midline and thyroid not enlarged, symmetric, no tenderness/mass/nodules Lungs: clear to auscultation bilaterally Heart: regular rate and rhythm, S1, S2 normal, no murmur, click, rub or gallop Extremities: extremities normal, atraumatic, no cyanosis or edema  Lab Results: Results for orders placed or performed during the hospital encounter of 12/31/15 (from the past 48 hour(s))  I-STAT 3, arterial blood gas (G3+)     Status: Abnormal   Collection Time: 01/02/16  7:51 PM  Result Value Ref Range   pH, Arterial 7.439 7.350 - 7.450   pCO2 arterial 32.0 (L) 35.0 - 45.0 mmHg   pO2, Arterial 62.0 (L) 80.0 - 100.0 mmHg   Bicarbonate 21.6 20.0 - 24.0 mEq/L   TCO2 23 0 - 100 mmol/L   O2 Saturation 92.0 %   Acid-base deficit 2.0 0.0 - 2.0 mmol/L   Patient temperature 99.2 F    Collection site RADIAL, ALLEN'S TEST ACCEPTABLE    Drawn by Operator    Sample type ARTERIAL   Glucose, capillary     Status: Abnormal   Collection Time: 01/02/16  7:53 PM  Result Value Ref Range   Glucose-Capillary 126 (H) 65 - 99 mg/dL   Comment 1 Capillary Specimen   Urinalysis, Routine w reflex microscopic (not at [facility])     Status: Abnormal   Collection Time:  01/02/16  8:03 PM  Result Value Ref Range   Color, Urine YELLOW YELLOW   APPearance CLOUDY (A) CLEAR   Specific Gravity, Urine 1.030 1.005 - 1.030   pH 6.0 5.0 - 8.0   Glucose, UA NEGATIVE NEGATIVE mg/dL   Hgb urine dipstick LARGE (A) NEGATIVE   Bilirubin Urine NEGATIVE NEGATIVE   Ketones, ur NEGATIVE NEGATIVE mg/dL   Protein, ur NEGATIVE NEGATIVE mg/dL   Nitrite NEGATIVE NEGATIVE   Leukocytes, UA NEGATIVE NEGATIVE  Urine microscopic-add on     Status: None   Collection Time: 01/02/16  8:03 PM  Result Value Ref Range   Squamous Epithelial / LPF NONE SEEN NONE SEEN   WBC, UA NONE SEEN 0 - 5 WBC/hpf   RBC / HPF TOO NUMEROUS TO COUNT 0 - 5 RBC/hpf   Bacteria, UA NONE SEEN NONE SEEN  Comprehensive metabolic panel     Status: Abnormal   Collection Time: 01/03/16 12:49 AM  Result Value Ref Range   Sodium 138 135 - 145 mmol/L   Potassium 3.8 3.5 - 5.1 mmol/L   Chloride 107 101 - 111 mmol/L   CO2 20 (L) 22 - 32 mmol/L   Glucose, Bld 167 (H) 65 - 99 mg/dL   BUN 13 6 - 20 mg/dL   Creatinine, Ser 1.14 0.61 - 1.24 mg/dL   Calcium 9.5 8.9 -  10.3 mg/dL   Total Protein 6.3 (L) 6.5 - 8.1 g/dL   Albumin 3.5 3.5 - 5.0 g/dL   AST 58 (H) 15 - 41 U/L   ALT 24 17 - 63 U/L   Alkaline Phosphatase 59 38 - 126 U/L   Total Bilirubin 1.4 (H) 0.3 - 1.2 mg/dL   GFR calc non Af Amer >60 >60 mL/min   GFR calc Af Amer >60 >60 mL/min    Comment: (NOTE) The eGFR has been calculated using the CKD EPI equation. This calculation has not been validated in all clinical situations. eGFR's persistently <60 mL/min signify possible Chronic Kidney Disease.    Anion gap 11 5 - 15  CBC     Status: Abnormal   Collection Time: 01/03/16  3:52 AM  Result Value Ref Range   WBC 16.6 (H) 4.0 - 10.5 K/uL   RBC 5.07 4.22 - 5.81 MIL/uL   Hemoglobin 14.8 13.0 - 17.0 g/dL   HCT 44.8 39.0 - 52.0 %   MCV 88.4 78.0 - 100.0 fL   MCH 29.2 26.0 - 34.0 pg   MCHC 33.0 30.0 - 36.0 g/dL   RDW 13.9 11.5 - 15.5 %   Platelets 226  150 - 400 K/uL  Glucose, capillary     Status: Abnormal   Collection Time: 01/03/16  8:41 AM  Result Value Ref Range   Glucose-Capillary 134 (H) 65 - 99 mg/dL   Comment 1 Capillary Specimen   I-STAT 3, arterial blood gas (G3+)     Status: None   Collection Time: 01/03/16 10:10 AM  Result Value Ref Range   pH, Arterial 7.428 7.350 - 7.450   pCO2 arterial 36.1 35.0 - 45.0 mmHg   pO2, Arterial 85.0 80.0 - 100.0 mmHg   Bicarbonate 23.9 20.0 - 24.0 mEq/L   TCO2 25 0 - 100 mmol/L   O2 Saturation 97.0 %   Patient temperature 97.7 F    Collection site RADIAL, ALLEN'S TEST ACCEPTABLE    Drawn by RT    Sample type ARTERIAL   Lactic acid, plasma     Status: None   Collection Time: 01/03/16 12:15 PM  Result Value Ref Range   Lactic Acid, Venous 0.8 0.5 - 2.0 mmol/L  Ammonia     Status: Abnormal   Collection Time: 01/03/16 12:15 PM  Result Value Ref Range   Ammonia 39 (H) 9 - 35 umol/L  Differential     Status: Abnormal   Collection Time: 01/03/16 12:20 PM  Result Value Ref Range   Neutrophils Relative % 84 %   Neutro Abs 14.2 (H) 1.7 - 7.7 K/uL   Lymphocytes Relative 5 %   Lymphs Abs 0.9 0.7 - 4.0 K/uL   Monocytes Relative 11 %   Monocytes Absolute 1.8 (H) 0.1 - 1.0 K/uL   Eosinophils Relative 0 %   Eosinophils Absolute 0.0 0.0 - 0.7 K/uL   Basophils Relative 0 %   Basophils Absolute 0.0 0.0 - 0.1 K/uL  CBC     Status: Abnormal   Collection Time: 01/04/16  2:54 AM  Result Value Ref Range   WBC 17.5 (H) 4.0 - 10.5 K/uL   RBC 4.95 4.22 - 5.81 MIL/uL   Hemoglobin 14.5 13.0 - 17.0 g/dL   HCT 44.2 39.0 - 52.0 %   MCV 89.3 78.0 - 100.0 fL   MCH 29.3 26.0 - 34.0 pg   MCHC 32.8 30.0 - 36.0 g/dL   RDW 14.3 11.5 - 15.5 %   Platelets  207 150 - 400 K/uL  Glucose, capillary     Status: Abnormal   Collection Time: 01/04/16  7:55 AM  Result Value Ref Range   Glucose-Capillary 126 (H) 65 - 99 mg/dL   Comment 1 Capillary Specimen     Imaging: Imaging results have been reviewed  Tele-  NSR 70s (I have personally reviewed)  Assessment/Plan:   1. Principal Problem: 2.   NSTEMI (non-ST elevated myocardial infarction) ([place]) 3. Active Problems: 4.   CAP (community acquired pneumonia) 90.   COPD (chronic obstructive pulmonary disease) (HCC) 6.   Lung nodule 7.   Essential hypertension 8.   Bradycardia 9.   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   Dyslipidemia, goal LDL below 70 12.   Altered mental status 13.   Left arm swelling 14.   Time Spent Directly with Patient:  15 minutes  Length of Stay:  LOS: 3 days   POD #3 NSTEMI, Complex RCA PCI/ DES X2 with occluded distal rPDA and assoc inferoapical  WMA with preserved EF. VSS. Exam benign. On approp meds (DAPT with Brilenta). Delirium seems to be clearing. W/U neg so far. Results of EEG pending. Head CT neg. Labs OK. Will get PT to see and eval today. Prob Tx to tele tomorrow. Apprec TRH's help and input.  Quay Burow 01/04/2016, 9:01 AM

## 2016-01-04 NOTE — Progress Notes (Signed)
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  last fever on 3/13 3am, persistent leukocytosis, ua/blood culture unremarkable,cxr on 3/13: Bronchitic changes with bibasilar atelectasis versus infiltrate. ABG unremarkable. Patient is on room air, no cough, continue abx, nebs, currently no wheezing,  Leukocytosis from dehydration? Will start gentle hydration, ns 75cc/hr for one day ,then reassess. Start stool softener to prevent constipation. Mildly elevated ammonia level, will repeat in am Per RN report, patient has agitation yesterday on 3/13, today more sedated and lethargic on 3/14 Neurology following, exelon stopped on 3/13 (per daughter patient was started on exelon in 11/2015), neurology started seroquel on 3/13will defer neurology for meds adjustment.  COPD exacerbation/CAP: continue rocephin/azithro and nebs, no wheezing, on room air on 3/14  CAD/HTN, NSTEMI S/p R cardiac catheterization due to thromboemboli mid-distal rPDA (Pt had 18 hrs Aggrastat IV) with infero- apical wall motion abnormality. 2 D Echo Nl LVF, EF of 60-65% EKG w/o acute changes.  Plan  per cardiology  Left lung nodule Ct chest 3/11 showed Lobular right apical pulmonary nodule measures 2.2 x 1.5 x 1.3 cm with minimal spiculation, concerning for primary bronchogenic neoplasm. There is no adenopathy. Other than a subcentimeter lucency in T4 which is nonspecific, no evidence of metastatic disease For PET as OP.   Left arm swelling with ecchymoses. These findings are new, could be due to recent anticoagulation during cath LUE [place] no DVT   Dementia Per family patient was started on aricept, then exelon this year Patient lives along prior to hospitalization, daughter lives next door. Diet modified per  swallow evaluation, on aspiration precaution   Code Status: DNR, confirmed with daughter who is in room  Family Communication: patient and daughter in room  Disposition Plan: per cardiology (primary team)    Consultants:  Cardiology primary  [facility] consultant for altered mental status, leukocytosis  Neurology consultant for confusion  Procedures:  Cardiac cath on 3/11  EEG   Antibiotics:  Rocephin/zithro from admission   Objective: BP 153/90 mmHg  Pulse 78  Temp(Src) 97.7 F (36.5 C) (Oral)  Resp 17  Ht  (1.727 m)  Wt 86.274 kg (190 lb 3.2 oz)  BMI 28.93 kg/m2  SpO2 96%  Intake/Output Summary (Last 24 hours) at 01/04/16 1455 Last data filed at 01/04/16 1000  Gross per 24 hour  Intake    400 ml  Output    250 ml  Net    150 ml   Filed Weights   01/02/16 0329 01/03/16 0321 01/04/16 0500  Weight: 89.767 kg (197 lb 14.4 oz) 90.674 kg (199 lb 14.4 oz) 86.274 kg (190 lb 3.2 oz)    Exam:   General:  Lethargic, on room air, follow commands,  not oriented, not agitated  Cardiovascular: RRR  Respiratory: CTABL  Abdomen: Soft/ND/NT, positive BS  Musculoskeletal: No Edema  Neuro:  Lethargic, on room air, follow commands, not oriented, not agitated  Data Reviewed: Basic Metabolic Panel:  Recent Labs Lab 12/31/15 2227 01/01/16 0449  01/02/16 0806 01/03/16 0049  NA 139 140 140 138  K 3.3* 3.5 3.6 3.8  CL 104 104 106 107  CO2 29 30 25  20*  GLUCOSE 183* 106* 115* 167*  BUN 25* 23* 18 13  CREATININE 1.35* 1.14 1.18 1.14  CALCIUM 9.4 9.2 9.0 9.5   Liver Function Tests:  Recent Labs Lab 12/31/15 2227 01/01/16 0449 01/02/16 0806 01/03/16 0049  AST 18 15 37 58*  ALT 17 14* 16* 24  ALKPHOS 62 56 50 59  BILITOT 0.5 0.7 0.3 1.4*  PROT 6.8 6.1* 5.2* 6.3*  ALBUMIN 3.9 3.5 3.2* 3.5   No results for input(s): LIPASE, AMYLASE in the last 168 hours.  Recent Labs Lab 01/03/16 1215  AMMONIA 39*   CBC:  Recent Labs Lab 12/31/15 2227 01/01/16 0449 01/02/16 0806 01/03/16 0352 01/03/16 1220 01/04/16 0254  WBC 13.7* 11.0* 11.1* 16.6*  --  17.5*  NEUTROABS 10.3*  --   --   --  14.2*  --   HGB 16.0 14.5 13.8 14.8  --  14.5  HCT 47.0 44.0 42.1 44.8  --  44.2  MCV 89.5 90.2 88.6 88.4  --  89.3  PLT 216 220 208 226  --  207   Cardiac Enzymes:    Recent Labs Lab 01/01/16 0857 01/01/16 1429 01/01/16 1840 01/02/16 0155 01/02/16 0806  TROPONINI 2.79* 3.19* 2.29* 1.15* 7.83*   BNP (last 3 results)  Recent Labs  01/01/16 1840  BNP 36.1    ProBNP (last 3 results) No results for input(s): PROBNP in the last 8760 hours.  CBG:  Recent Labs Lab 01/02/16 0804 01/02/16 1953 01/03/16 0841 01/04/16 0755  GLUCAP 96 126* 134* 126*    Recent Results (from the past 240 hour(s))  Blood culture (routine x 2)     Status: None (Preliminary result)   Collection Time: 01/01/16  1:14 AM  Result Value Ref Range Status   Specimen Description RIGHT ANTECUBITAL  Final   Special Requests BOTTLES DRAWN AEROBIC ONLY 6CC  Final   Culture NO GROWTH 3 DAYS  Final   Report Status PENDING  Incomplete  Blood culture (routine x 2)     Status: None (Preliminary result)   Collection Time: 01/01/16  1:25 AM  Result Value Ref Range Status   Specimen Description LEFT ANTECUBITAL  Final   Special Requests BOTTLES DRAWN  AEROBIC AND ANAEROBIC 6CC  Final   Culture NO GROWTH 3 DAYS  Final   Report Status PENDING  Incomplete  MRSA PCR Screening     Status: None   Collection Time: 01/01/16  2:45 AM  Result Value Ref Range Status   MRSA by PCR NEGATIVE NEGATIVE Final    Comment:        The GeneXpert MRSA Assay (FDA approved for NASAL specimens only), is one component of a comprehensive MRSA colonization surveillance program. It is not intended to diagnose MRSA infection nor to guide or monitor treatment for MRSA infections.   Culture, blood (routine x 2)     Status: None (Preliminary result)   Collection Time: 01/03/16 12:38 AM  Result Value Ref Range Status   Specimen Description BLOOD RIGHT ARM  Final   Special Requests IN PEDIATRIC BOTTLE  3CC  Final   Culture NO GROWTH 1 DAY  Final   Report Status PENDING  Incomplete  Culture, blood (routine x 2)     Status: None (Preliminary result)   Collection Time: 01/03/16 12:42 AM  Result Value Ref Range Status   Specimen Description BLOOD RIGHT ARM  Final   Special Requests IN PEDIATRIC BOTTLE 3CC  Final   Culture NO GROWTH 1 DAY  Final   Report Status PENDING  Incomplete     Studies: No results found.  Scheduled Meds: . aspirin EC  81 mg Oral Daily  . atorvastatin  80 mg Oral q1800  . azithromycin  500 mg Intravenous Q24H  . cefTRIAXone (ROCEPHIN)  IV  1 g Intravenous Q24H  . finasteride  5 mg Oral Daily  . guaiFENesin  600 mg Oral BID  . ipratropium-albuterol  3 mL Nebulization Q4H  . losartan  50 mg Oral Daily  . metoprolol tartrate  12.5 mg Oral BID  . QUEtiapine  25 mg Oral q1800  . sodium chloride flush  3 mL Intravenous Q12H  . sodium chloride flush  3 mL Intravenous Q12H  . sodium chloride flush  3 mL Intravenous Q12H  . tamsulosin  0.8 mg Oral Daily  . ticagrelor  90 mg Oral BID  . tiotropium  1 capsule Inhalation Q24H    Continuous Infusions: . sodium chloride Stopped (01/03/16 0030)  . sodium chloride       Time spent:   Chaze Hruska MD, PhD  [facility] Pager [phone]. If 7PM-7AM, please contact night-coverage at [email or web address], password [facility] 01/04/2016, 2:55 PM  LOS: 3 days

## 2016-01-04 NOTE — Care Management Important Message (Signed)
Important Message  Patient Details  Name: Lee Chang MRN: [id number] Date of Birth: 07/03/1941   Medicare Important Message Given:  Yes    Lee Chang 01/04/2016, 12:15 PM

## 2016-01-04 NOTE — Progress Notes (Signed)
Pharmacy Antibiotic Note  Elicia Lamplmer W JamaicaFrench is a 75 y.o. male with CAP on rocephin/azithromycin D4.  WBC= 17.5, afeb, Cx- ngtd.   Plan: -No antibiotic dose changes will be needed -Consider 7 days total treatment -Will sign off. Please call pharmacy with any further needs.  Height: 5\' 8"  (172.7 cm) Weight: 190 lb 3.2 oz (86.274 kg) IBW/kg (Calculated) : 68.4  Temp (24hrs), Avg:99.1 F (37.3 C), Min:98.2 F (36.8 C), Max:99.8 F (37.7 C)   Recent Labs Lab 12/31/15 2227 01/01/16 0449 01/02/16 0806 01/03/16 0049 01/03/16 0352 01/03/16 1215 01/04/16 0254  WBC 13.7* 11.0* 11.1*  --  16.6*  --  17.5*  CREATININE 1.35* 1.14 1.18 1.14  --   --   --   LATICACIDVEN  --   --   --   --   --  0.8  --     Estimated Creatinine Clearance: 59.9 mL/min (by C-G formula based on Cr of 1.14).    Allergies  Allergen Reactions  . Asa [Aspirin]     Bleeding ulcer    Antimicrobials this admission: Ceftriaxone 3/11>> Azithromycin 3/11>>  Microbiology results: 3/11 blood x2- ngtd 3/11 MRSA PCR- neg  Thank you for allowing pharmacy to be a part of this patient's care.  Benny LennertMeyer, Lorna Strother David 01/04/2016 7:46 AM

## 2016-01-04 NOTE — Progress Notes (Signed)
Noted with on and off jerky movement of upper extremities, MD aware. Continue to monitor.

## 2016-01-05 ENCOUNTER — Inpatient Hospital Stay (HOSPITAL_COMMUNITY): Payer: Medicare Other

## 2016-01-05 DIAGNOSIS — G9340 Encephalopathy, unspecified: Secondary | ICD-10-CM

## 2016-01-05 DIAGNOSIS — R410 Disorientation, unspecified: Secondary | ICD-10-CM

## 2016-01-05 LAB — HEPATIC FUNCTION PANEL
ALK PHOS: 49 U/L (ref 38–126)
ALT: 18 U/L (ref 17–63)
AST: 26 U/L (ref 15–41)
Albumin: 3.2 g/dL — ABNORMAL LOW (ref 3.5–5.0)
BILIRUBIN INDIRECT: 0.7 mg/dL (ref 0.3–0.9)
BILIRUBIN TOTAL: 0.9 mg/dL (ref 0.3–1.2)
Bilirubin, Direct: 0.2 mg/dL (ref 0.1–0.5)
TOTAL PROTEIN: 5.9 g/dL — AB (ref 6.5–8.1)

## 2016-01-05 LAB — URINALYSIS, ROUTINE W REFLEX MICROSCOPIC
BILIRUBIN URINE: NEGATIVE
Glucose, UA: NEGATIVE mg/dL
KETONES UR: NEGATIVE mg/dL
NITRITE: NEGATIVE
PROTEIN: 30 mg/dL — AB
Specific Gravity, Urine: 1.019 (ref 1.005–1.030)
pH: 5 (ref 5.0–8.0)

## 2016-01-05 LAB — BASIC METABOLIC PANEL
Anion gap: 12 (ref 5–15)
BUN: 39 mg/dL — ABNORMAL HIGH (ref 6–20)
CALCIUM: 9.3 mg/dL (ref 8.9–10.3)
CO2: 21 mmol/L — ABNORMAL LOW (ref 22–32)
Chloride: 111 mmol/L (ref 101–111)
Creatinine, Ser: 1.98 mg/dL — ABNORMAL HIGH (ref 0.61–1.24)
GFR calc Af Amer: 36 mL/min — ABNORMAL LOW (ref >60)
GFR, EST NON AFRICAN AMERICAN: 31 mL/min — AB (ref >60)
GLUCOSE: 140 mg/dL — AB (ref 65–99)
Potassium: 3.9 mmol/L (ref 3.5–5.1)
Sodium: 144 mmol/L (ref 135–145)

## 2016-01-05 LAB — CBC
HCT: 42.8 % (ref 39.0–52.0)
HEMOGLOBIN: 13.9 g/dL (ref 13.0–17.0)
MCH: 29.2 pg (ref 26.0–34.0)
MCHC: 32.5 g/dL (ref 30.0–36.0)
MCV: 89.9 fL (ref 78.0–100.0)
Platelets: 207 10*3/uL (ref 150–400)
RBC: 4.76 MIL/uL (ref 4.22–5.81)
RDW: 14.5 % (ref 11.5–15.5)
WBC: 17.5 10*3/uL — AB (ref 4.0–10.5)

## 2016-01-05 LAB — GLUCOSE, CAPILLARY: Glucose-Capillary: 123 mg/dL — ABNORMAL HIGH (ref 65–99)

## 2016-01-05 LAB — URINE MICROSCOPIC-ADD ON

## 2016-01-05 LAB — PROCALCITONIN: Procalcitonin: <0.1 ng/mL

## 2016-01-05 LAB — LACTIC ACID, PLASMA: LACTIC ACID, VENOUS: 1.5 mmol/L (ref 0.5–2.0)

## 2016-01-05 LAB — AMMONIA: AMMONIA: 35 umol/L (ref 9–35)

## 2016-01-05 MED ORDER — PIPERACILLIN-TAZOBACTAM 3.375 G IVPB
3.3750 g | Freq: Three times a day (TID) | INTRAVENOUS | Status: DC
Start: 2016-01-05 — End: 2016-01-11
  Administered 2016-01-05 – 2016-01-11 (×17): 3.375 g via INTRAVENOUS
  Filled 2016-01-05 (×18): qty 50

## 2016-01-05 MED ORDER — WHITE PETROLATUM GEL
Status: AC
  Filled 2016-01-05: qty 1

## 2016-01-05 MED ORDER — METHYLPREDNISOLONE SODIUM SUCC 40 MG IJ SOLR
40.0000 mg | Freq: Once | INTRAMUSCULAR | Status: AC
  Administered 2016-01-05: 40 mg via INTRAVENOUS
  Filled 2016-01-05: qty 1

## 2016-01-05 MED ORDER — SODIUM CHLORIDE 0.9 % IV SOLN
INTRAVENOUS | Status: DC
Start: 2016-01-05 — End: 2016-01-06
  Administered 2016-01-05: 23:00:00 via INTRAVENOUS

## 2016-01-05 NOTE — Progress Notes (Signed)
Speech Language Pathology Dysphagia Treatment Patient Details Name: Lee Chang MRN: [id number] DOB: 05/13/1941 Today's Date: 01/05/2016 Time: 1152-1209 SLP Time Calculation (min) (ACUTE ONLY): 17 min  Assessment / Plan / Recommendation Clinical Impression    Daughter notified SLP upon arrival that pt not alert for PO intake at this time and was asked to hold POs until alertness improves. Per daughter report, pt increasingly lethargic today. Daughter educated re: thickened liquids, diet recommendation of Dysphagia 1 (puree) textures, nectar thick liquids and meds crushed in puree, as well as continued SLP intervention. Will continue efforts to determine diet tolerance.    Diet Recommendation    Dysphagia 1 (puree) diet, nectar thick liquids, meds crushed in puree   SLP Plan Continue with current plan of care      Swallowing Goals     General Behavior/Cognition: Agitated;Lethargic/Drowsy Patient Positioning: Upright in bed Oral care provided: N/A HPI: 75 y.o. male with a Past Medical History of peptic ulcer disease, COPD, and NSTEMI, right lung nodule, HLD, dementia who presented on 01/01/2015 with chest pain and pneumonia. Patient underwent cardiac catheterization with placement of DES on 01/02/2016.   Oral Cavity - Oral Hygiene     Dysphagia Treatment Family/Caregiver Educated: daughter Treatment Methods: Patient/caregiver education Patient observed directly with PO's: No Reason PO's not observed: Lethargic Feeding: Total assist   GO     Lynita LombardLauren Yudit Modesitt 01/05/2016, 12:16 PM   Lynita LombardLauren Kaiyden Simkin, Student-SLP

## 2016-01-05 NOTE — Progress Notes (Signed)
EEG completed; results pending.    

## 2016-01-05 NOTE — Progress Notes (Signed)
Subjective:  POD # 4 Complex RCA PCI/ DES X2 in setting of Inf STEMI. Major issue is AMS/ Delirium/   Objective:  Temp:  [97.5 F (36.4 C)-101 F (38.3 C)] 97.8 F (36.6 C) (03/15 0833) Pulse Rate:  [76-113] 92 (03/15 0833) Resp:  [17-28] 22 (03/15 0833) BP: (107-184)/(65-95) 148/83 mmHg (03/15 0833) SpO2:  [93 %-100 %] 100 % (03/15 0833) Weight:  [196 lb 6.9 oz (89.1 kg)] 196 lb 6.9 oz (89.1 kg) (03/15 0428) Weight change: 6 lb 3.7 oz (2.826 kg)  Intake/Output from previous day: 03/14 0701 - 03/15 0700 In: 6754 [P.O.:150; I.V.:1200; IV Piggyback:300] Out: 235 [Urine:235]  Intake/Output from this shift: Total I/O In: 75 [I.V.:75] Out: -   Physical Exam: General appearance: alert and no distress Neck: no adenopathy, no carotid bruit, no JVD, supple, symmetrical, trachea midline and thyroid not enlarged, symmetric, no tenderness/mass/nodules Lungs: clear to auscultation bilaterally Heart: regular rate and rhythm, S1, S2 normal, no murmur, click, rub or gallop Extremities: extremities normal, atraumatic, no cyanosis or edema  Lab Results: Results for orders placed or performed during the hospital encounter of 12/31/15 (from the past 48 hour(s))  Lactic acid, plasma     Status: None   Collection Time: 01/03/16 12:15 PM  Result Value Ref Range   Lactic Acid, Venous 0.8 0.5 - 2.0 mmol/L  Ammonia     Status: Abnormal   Collection Time: 01/03/16 12:15 PM  Result Value Ref Range   Ammonia 39 (H) 9 - 35 umol/L  Differential     Status: Abnormal   Collection Time: 01/03/16 12:20 PM  Result Value Ref Range   Neutrophils Relative % 84 %   Neutro Abs 14.2 (H) 1.7 - 7.7 K/uL   Lymphocytes Relative 5 %   Lymphs Abs 0.9 0.7 - 4.0 K/uL   Monocytes Relative 11 %   Monocytes Absolute 1.8 (H) 0.1 - 1.0 K/uL   Eosinophils Relative 0 %   Eosinophils Absolute 0.0 0.0 - 0.7 K/uL   Basophils Relative 0 %   Basophils Absolute 0.0 0.0 - 0.1 K/uL  CBC     Status: Abnormal   Collection Time: 01/04/16  2:54 AM  Result Value Ref Range   WBC 17.5 (H) 4.0 - 10.5 K/uL   RBC 4.95 4.22 - 5.81 MIL/uL   Hemoglobin 14.5 13.0 - 17.0 g/dL   HCT 44.2 39.0 - 52.0 %   MCV 89.3 78.0 - 100.0 fL   MCH 29.3 26.0 - 34.0 pg   MCHC 32.8 30.0 - 36.0 g/dL   RDW 14.3 11.5 - 15.5 %   Platelets 207 150 - 400 K/uL  Glucose, capillary     Status: Abnormal   Collection Time: 01/04/16  7:55 AM  Result Value Ref Range   Glucose-Capillary 126 (H) 65 - 99 mg/dL   Comment 1 Capillary Specimen   CBC     Status: Abnormal   Collection Time: 01/05/16  3:20 AM  Result Value Ref Range   WBC 17.5 (H) 4.0 - 10.5 K/uL   RBC 4.76 4.22 - 5.81 MIL/uL   Hemoglobin 13.9 13.0 - 17.0 g/dL   HCT 42.8 39.0 - 52.0 %   MCV 89.9 78.0 - 100.0 fL   MCH 29.2 26.0 - 34.0 pg   MCHC 32.5 30.0 - 36.0 g/dL   RDW 14.5 11.5 - 15.5 %   Platelets 207 150 - 400 K/uL  Procalcitonin     Status: None   Collection Time: 01/05/16  3:20 AM  Result Value Ref Range   Procalcitonin <0.10 ng/mL    Comment:        Interpretation: PCT (Procalcitonin) <= 0.5 ng/mL: Systemic infection (sepsis) is not likely. Local bacterial infection is possible. (NOTE)         ICU PCT Algorithm               Non ICU PCT Algorithm    ----------------------------     ------------------------------         PCT < 0.25 ng/mL                 PCT < 0.1 ng/mL     Stopping of antibiotics            Stopping of antibiotics       strongly encouraged.               strongly encouraged.    ----------------------------     ------------------------------       PCT level decrease by               PCT < 0.25 ng/mL       >= 80% from peak PCT       OR PCT 0.25 - 0.5 ng/mL          Stopping of antibiotics                                             encouraged.     Stopping of antibiotics           encouraged.    ----------------------------     ------------------------------       PCT level decrease by              PCT >= 0.25 ng/mL       < 80% from  peak PCT        AND PCT >= 0.5 ng/mL            Continuin g antibiotics                                              encouraged.       Continuing antibiotics            encouraged.    ----------------------------     ------------------------------     PCT level increase compared          PCT > 0.5 ng/mL         with peak PCT AND          PCT >= 0.5 ng/mL             Escalation of antibiotics                                          strongly encouraged.      Escalation of antibiotics        strongly encouraged.   Lactic acid, plasma     Status: None   Collection Time: 01/05/16  3:20 AM  Result Value Ref Range   Lactic Acid, Venous 1.5 0.5 - 2.0 mmol/L  Ammonia  Status: None   Collection Time: 01/05/16  3:20 AM  Result Value Ref Range   Ammonia 35 9 - 35 umol/L  Glucose, capillary     Status: Abnormal   Collection Time: 01/05/16  8:26 AM  Result Value Ref Range   Glucose-Capillary 123 (H) 65 - 99 mg/dL   Comment 1 Capillary Specimen   Basic metabolic panel     Status: Abnormal   Collection Time: 01/05/16  8:53 AM  Result Value Ref Range   Sodium 144 135 - 145 mmol/L   Potassium 3.9 3.5 - 5.1 mmol/L   Chloride 111 101 - 111 mmol/L   CO2 21 (L) 22 - 32 mmol/L   Glucose, Bld 140 (H) 65 - 99 mg/dL   BUN 39 (H) 6 - 20 mg/dL   Creatinine, Ser 1.98 (H) 0.61 - 1.24 mg/dL   Calcium 9.3 8.9 - 10.3 mg/dL   GFR calc non Af Amer 31 (L) >60 mL/min   GFR calc Af Amer 36 (L) >60 mL/min    Comment: (NOTE) The eGFR has been calculated using the CKD EPI equation. This calculation has not been validated in all clinical situations. eGFR's persistently <60 mL/min signify possible Chronic Kidney Disease.    Anion gap 12 5 - 15  Hepatic function panel     Status: Abnormal   Collection Time: 01/05/16  8:53 AM  Result Value Ref Range   Total Protein 5.9 (L) 6.5 - 8.1 g/dL   Albumin 3.2 (L) 3.5 - 5.0 g/dL   AST 26 15 - 41 U/L   ALT 18 17 - 63 U/L   Alkaline Phosphatase 49 38 - 126 U/L    Total Bilirubin 0.9 0.3 - 1.2 mg/dL   Bilirubin, Direct 0.2 0.1 - 0.5 mg/dL   Indirect Bilirubin 0.7 0.3 - 0.9 mg/dL    Imaging: Imaging results have been reviewed  Tele- NSR ( I have personally reviewed)  Assessment/Plan:   1. Principal Problem: 2.   NSTEMI (non-ST elevated myocardial infarction) ([place]) 3. Active Problems: 4.   CAP (community acquired pneumonia) 69.   COPD (chronic obstructive pulmonary disease) (H[facility]) 6.   Lung nodule 7.   Essential hypertension 8.   Bradycardia 9.   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   Dyslipidemia, goal LDL below 70 12.   Altered mental status 13.   Left arm swelling 14.   Acute delirium 15.   Time Spent Directly with Patient:  20 minutes  Length of Stay:  LOS: 4 days   POD #4 Inf STEMI Rx with RCA PCI/DES X2 with non critical Dz otherwise. RPDA thrombo embolus with preserved LV Fxn and infer apical WMA. On DAPT with Brilenta. Major issues now are AMS with delirium, jerking movements,  Worsening renal Fxn SCR 1.2--->2.0 and persistently elevated WBC 17.5K on ATBX. CXR and UA clear. Will tx to tele. Cardiac stable. Neuro w/u in progress. TRH's help is greatly appreciated.   Quay Burow 01/05/2016, 10:50 AM

## 2016-01-05 NOTE — Progress Notes (Signed)
PROGRESS NOTE  Lee Chang ZOX:[id number] DOB: 01-15-1941 DOA: 12/31/2015 PCP: [name], MD  Assessment/Plan: Subjective; He is lethargic, open eyes answer some questions.  Denies worsening dyspnea.  Had BM.   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 Delirium,.Confusion, tremors.  CT head on acute findings, EEG nonspecific likely multifactorial, including delirium, dehydration, metabolic abnormalities,.  Neurology following, exelon stopped on 3/13 (per daughter patient was started on exelon in 11/2015), neurology started seroquel on 3/13will defer neurology for meds adjustment.  Fever, leukocytosis.  Spike fever yesterday, WBC still elevated.  Will change ceftriaxone to Zosyn.  Repeat Chest x ray/   AKI;  Hold cozaar, continue with IV fluids.  Might be multifactorial   Mildly elevated ammonia level, started on lactulose.  Ammonia level normalized.   COPD exacerbation/CAP: continue with nebulizer, will give one time dose solumedrol, patient with audible wheezing.   CAD/HTN, NSTEMI S/p R cardiac catheterization due to thromboemboli mid-distal rPDA (Pt had 18 hrs Aggrastat IV) with infero- apical wall motion abnormality. 2 D Echo Nl LVF, EF of 60-65% EKG w/o acute changes.  Plan per cardiology  Left lung nodule Ct chest 3/11 showed Lobular right apical pulmonary nodule measures 2.2 x 1.5 x 1.3 cm with minimal spiculation, concerning for primary bronchogenic neoplasm. There is no adenopathy. Other than a subcentimeter lucency  in T4 which is nonspecific, no evidence of metastatic disease For PET as OP.   Left arm swelling with ecchymoses. These findings are new, could be due to recent anticoagulation during cath LUE [place] no DVT   Dementia Per family patient was started on aricept, then exelon this year Diet modified per  swallow evaluation, on aspiration precaution   Code Status: DNR, confirmed with daughter who is in room  Family Communication: patient and daughter in room  Disposition Plan: per cardiology (primary team)    Consultants:  Cardiology primary  [facility] consultant for altered mental status, leukocytosis  Neurology consultant for confusion  Procedures:  Cardiac cath on 3/11  EEG   Antibiotics:  Rocephin/zithro from admission---change to Zosyn 3-15   Objective: BP 148/83 mmHg  Pulse 92  Temp(Src) 97.8 F (36.6 C) (Oral)  Resp 22  Ht  (1.727 m)  Wt 89.1 kg (196 lb 6.9 oz)  BMI 29.87 kg/m2  SpO2 100%  Intake/Output Summary (Last 24 hours) at 01/05/16 0853 Last data filed at 01/05/16 0600  Gross per 24 hour  Intake   1650 ml  Output    235 ml  Net   1415 ml   Filed Weights   01/03/16 0321 01/04/16 0500 01/05/16 0428  Weight: 90.674 kg (199 lb 14.4 oz) 86.274 kg (190 lb 3.2 oz) 89.1 kg (196 lb 6.9 oz)    Exam:   General:  Lethargic, answer some questions.   Cardiovascular: RRR  Respiratory: Bilateral Wheezing.   Abdomen: Soft/ND/NT, positive BS  Musculoskeletal: No Edema  Neuro:  Lethargic, answer some questions. Mild tremors,   Data Reviewed: Basic Metabolic Panel:  Recent Labs Lab 12/31/15 2227 01/01/16 0449  01/02/16 0806 01/03/16 0049  NA 139 140 140 138  K 3.3* 3.5 3.6 3.8  CL 104 104 106 107  CO2 29 30 25  20*  GLUCOSE 183* 106* 115* 167*  BUN 25* 23* 18 13  CREATININE 1.35* 1.14 1.18 1.14  CALCIUM 9.4 9.2 9.0 9.5   Liver Function Tests:  Recent Labs Lab 12/31/15 2227 01/01/16 0449 01/02/16 0806 01/03/16 0049  AST 18 15 37 58*    ALT 17 14* 16* 24  ALKPHOS 62 56 50 59  BILITOT 0.5 0.7 0.3 1.4*  PROT 6.8 6.1* 5.2* 6.3*  ALBUMIN 3.9 3.5 3.2* 3.5   No results for input(s): LIPASE, AMYLASE in the last 168 hours.  Recent Labs Lab 01/03/16 1215 01/05/16 0320  AMMONIA 39* 35   CBC:  Recent Labs Lab 12/31/15 2227 01/01/16 0449 01/02/16 0806 01/03/16 0352 01/03/16 1220 01/04/16 0254 01/05/16 0320  WBC 13.7* 11.0* 11.1* 16.6*  --  17.5* 17.5*  NEUTROABS 10.3*  --   --   --  14.2*  --   --   HGB 16.0 14.5 13.8 14.8  --  14.5 13.9  HCT 47.0 44.0 42.1 44.8  --  44.2 42.8  MCV 89.5 90.2 88.6 88.4  --  89.3 89.9  PLT 216 220 208 226  --  207 207   Cardiac Enzymes:    Recent Labs Lab 01/01/16 0857 01/01/16 1429 01/01/16 1840 01/02/16 0155 01/02/16 0806  TROPONINI 2.79* 3.19* 2.29* 1.15* 7.83*   BNP (last 3 results)  Recent Labs  01/01/16 1840  BNP 36.1    ProBNP (last 3 results) No results for input(s): PROBNP in the last 8760 hours.  CBG:  Recent Labs Lab 01/02/16 0804 01/02/16 1953 01/03/16 0841 01/04/16 0755  GLUCAP 96 126* 134* 126*    Recent Results (from the past 240 hour(s))  Blood culture (routine x 2)     Status: None (Preliminary result)   Collection Time: 01/01/16  1:14 AM  Result Value Ref Range Status   Specimen Description RIGHT ANTECUBITAL  Final   Special Requests BOTTLES DRAWN AEROBIC ONLY 6CC  Final   Culture NO GROWTH 3 DAYS  Final   Report Status PENDING  Incomplete  Blood culture (routine x 2)     Status: None (Preliminary result)   Collection Time: 01/01/16  1:25 AM  Result Value Ref Range Status   Specimen Description LEFT ANTECUBITAL  Final   Special Requests BOTTLES DRAWN AEROBIC AND ANAEROBIC 6CC  Final   Culture NO GROWTH 3 DAYS  Final   Report Status PENDING  Incomplete  MRSA PCR Screening     Status: None   Collection Time: 01/01/16  2:45 AM  Result Value Ref Range Status   MRSA by PCR NEGATIVE NEGATIVE Final    Comment:        The GeneXpert  MRSA Assay (FDA approved for NASAL specimens only), is one component of a comprehensive MRSA colonization surveillance program. It is not intended to diagnose MRSA infection nor to guide or monitor treatment for MRSA infections.   Culture, blood (routine x 2)     Status: None (Preliminary result)   Collection Time: 01/03/16 12:38 AM  Result Value Ref Range Status   Specimen Description BLOOD RIGHT ARM  Final   Special Requests IN PEDIATRIC BOTTLE 3CC  Final   Culture NO GROWTH 1 DAY  Final   Report Status PENDING  Incomplete  Culture, blood (routine x 2)     Status: None (Preliminary  result)   Collection Time: 01/03/16 12:42 AM  Result Value Ref Range Status   Specimen Description BLOOD RIGHT ARM  Final   Special Requests IN PEDIATRIC BOTTLE 3CC  Final   Culture NO GROWTH 1 DAY  Final   Report Status PENDING  Incomplete     Studies: No results found.  Scheduled Meds: . aspirin EC  81 mg Oral Daily  . atorvastatin  80 mg Oral q1800  . azithromycin  500 mg Intravenous Q24H  . cefTRIAXone (ROCEPHIN)  IV  1 g Intravenous Q24H  . finasteride  5 mg Oral Daily  . guaiFENesin  600 mg Oral BID  . ipratropium-albuterol  3 mL Nebulization TID  . losartan  50 mg Oral Daily  . metoprolol tartrate  12.5 mg Oral BID  . QUEtiapine  25 mg Oral q1800  . senna-docusate  1 tablet Oral BID  . sodium chloride flush  3 mL Intravenous Q12H  . sodium chloride flush  3 mL Intravenous Q12H  . sodium chloride flush  3 mL Intravenous Q12H  . tamsulosin  0.8 mg Oral Daily  . ticagrelor  90 mg Oral BID    Continuous Infusions: . sodium chloride Stopped (01/03/16 0030)  . sodium chloride 75 mL/hr at 01/05/16 0434     Time spent:  Hartley Barefoot A MD [facility] Pager [phone] 7PM-7AM, please contact night-coverage at [email or web address], password [facility] 01/05/2016, 8:53 AM  LOS: 4 days

## 2016-01-05 NOTE — Progress Notes (Signed)
Blood tinged urine noted by family this AM . Family now indicating that it has gotten yellowish-brown in color. Ward Givenshris Berge NP called and updated. New order given  for Urinalysis . Pt family also concerned aboutpt neurologically. Requesting to be updated by neurology. Night RN to relay message family would like to talk to neurologist.

## 2016-01-05 NOTE — Progress Notes (Addendum)
Interval History:                                                                                                                      Lee Chang is an 75 y.o. male patient with  acute delirium likely due to deconditioning of her baseline cognitive impairment during the hospitalization.  His confusion and agitation symptoms have improved today with adding Seroquel. His slightly more drowsy this morning which is not unexpected with the medication.  Otherwise no other new neurological symptoms.    Past Medical History: Past Medical History  Diagnosis Date  . Ulcer   . Asthma   . Essential hypertension   . NSTEMI (non-ST elevated myocardial infarction) (HCC) 12/31/2015    Echo 01/01/16 (pre-CATH-PCI): EF 60-65%, no RWMA.   . 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 Moderate OM1 35% & midLAD 45%  . 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 Dyslipidemia, goal LDL below 70 01/02/2016  . COPD (chronic obstructive pulmonary disease) (HCC) 01/01/2016  . Lung nodule 01/01/2016  . Dementia     Past Surgical History  Procedure Laterality Date  . Hernia repair    . Cardiac catheterization N/A 01/01/2016    Procedure: Left Heart Cath and Coronary Angiography;  Surgeon: Marykay Lex, MD;  Location: [facility] INVASIVE CV LAB;  Service: Cardiovascular;  Laterality: N/A;  . Cardiac catheterization Right 01/01/2016    Procedure: Coronary Stent Intervention;  Surgeon: Marykay Lex, MD;  Location: [facility] INVASIVE CV LAB;  Service: Cardiovascular;  Laterality: Right;    Family History: History reviewed. No pertinent family history.  Social History:   reports that he has never smoked. He does not have any smokeless tobacco history on file.  He reports that he does not drink alcohol. His drug history is not on file.  Allergies:  Allergies  Allergen Reactions  . Asa [Aspirin]     Bleeding ulcer     Medications:                                                                                                                         Current facility-administered medications:  .  0.9 %  sodium chloride infusion, , Intravenous, Continuous, Vesta Mixer, MD, Stopped at 01/03/16 0030 .  0.9 %  sodium chloride infusion, 250 mL, Intravenous, PRN, Marykay Lex, MD .  0.9 %  sodium chloride infusion, , Intravenous, Continuous, Albertine Grates, MD, Last Rate: 75 mL/hr at 01/04/16 1900 .  acetaminophen (TYLENOL) tablet 650 mg, 650 mg, Oral, Q6H PRN, 650 mg at 01/03/16 0324 **OR** acetaminophen (TYLENOL) suppository 650 mg, 650 mg, Rectal, Q6H PRN, Meredeth Ide, MD .  albuterol (PROVENTIL) (2.5 MG/3ML) 0.083% nebulizer solution 2.5 mg, 2.5 mg, Nebulization, Q2H PRN, Meredeth Ide, MD .  aspirin EC tablet 81 mg, 81 mg, Oral, Daily, Runell Gess, MD, 81 mg at 01/04/16 0914 .  atorvastatin (LIPITOR) tablet 80 mg, 80 mg, Oral, q1800, Erick Blinks, MD, 80 mg at 01/03/16 1723 .  azithromycin (ZITHROMAX) 500 mg in dextrose 5 % 250 mL IVPB, 500 mg, Intravenous, Q24H, Meredeth Ide, MD, 500 mg at 01/04/16 0101 .  cefTRIAXone (ROCEPHIN) 1 g in dextrose 5 % 50 mL IVPB, 1 g, Intravenous, Q24H, Erick Blinks, MD, 1 g at 01/04/16 0216 .  finasteride (PROSCAR) tablet 5 mg, 5 mg, Oral, Daily, Meredeth Ide, MD, 5 mg at 01/04/16 1000 .  food thickener (THICK IT) powder, , Oral, PRN, Vesta Mixer, MD .  guaiFENesin [facility]) 12 hr tablet 600 mg, 600 mg, Oral, BID, Meredeth Ide, MD, 600 mg at 01/04/16 0914 .  hydrALAZINE (APRESOLINE) injection 10 mg, 10 mg, Intravenous, Q6H PRN, Leeann Must, MD, 10 mg at 01/04/16 1728 .  ipratropium-albuterol (DUONEB) 0.5-2.5 (3) MG/3ML nebulizer solution 3 mL, 3 mL, Nebulization, TID, Vesta Mixer, MD .  losartan  (COZAAR) tablet 50 mg, 50 mg, Oral, Daily, 50 mg at 01/04/16 0914 **AND** [DISCONTINUED] hydrochlorothiazide (MICROZIDE) capsule 12.5 mg, 12.5 mg, Oral, Daily, Vesta Mixer, MD, 12.5 mg at 01/01/16 2000 .  metoprolol tartrate (LOPRESSOR) tablet 12.5 mg, 12.5 mg, Oral, BID, Runell Gess, MD, 12.5 mg at 01/04/16 0914 .  morphine 2 MG/ML injection 2 mg, 2 mg, Intravenous, Q1H PRN, Marykay Lex, MD .  nitroGLYCERIN (NITROSTAT) SL tablet 0.4 mg, 0.4 mg, Sublingual, Q5 min PRN, Vesta Mixer, MD, 0.4 mg at 01/01/16 2023 .  QUEtiapine (SEROQUEL) tablet 25 mg, 25 mg, Oral, q1800, Matt Delpizzo Daniel Nones, MD, 25 mg at 01/04/16 1712 .  senna-docusate (Senokot-S) tablet 1 tablet, 1 tablet, Oral, BID, Albertine Grates, MD, 1 tablet at 01/04/16 1712 .  sodium chloride flush (NS) 0.9 % injection 3 mL, 3 mL, Intravenous, Q12H, Meredeth Ide, MD, 3 mL at 01/04/16 2200 .  sodium chloride flush (NS) 0.9 % injection 3 mL, 3 mL, Intravenous, Q12H, Joellyn Rued, MD, 3 mL at 01/03/16 2135 .  sodium chloride flush (NS) 0.9 % injection 3 mL, 3 mL, Intravenous, Q12H, Marykay Lex, MD, 3 mL at 01/03/16 2135 .  sodium chloride flush (NS) 0.9 % injection 3 mL, 3 mL, Intravenous, PRN, Marykay Lex, MD .  tamsulosin [facility]) capsule 0.8 mg, 0.8 mg, Oral, Daily, Meredeth Ide, MD, 0.8 mg at 01/04/16 0919 .  ticagrelor (BRILINTA) tablet 90 mg, 90 mg, Oral, BID, Marykay Lex, MD, 90 mg at 01/04/16 2101   Neurologic Examination:  Today's Vitals   01/04/16 1800 01/04/16 1931 01/04/16 2000 01/04/16 2200  BP: 180/90  147/65 140/72  Pulse: 103 102 106 113  Temp:   101 F (38.3 C)   TempSrc:   Axillary   Resp: Height:      Weight:      SpO2: 97% 93% 95% 96%  PainSc:        Evaluation of higher integrative functions including: Level of alertness: Mildly drowsy, easily arousable to verbal  commands Oriented to place and person not to time,  Speech: fluent, no evidence of dysarthria or aphasia noted.  Test the following cranial nerves: 2-12 grossly intact Motor examination: Normal tone, bulk, full 5/5 motor strength in all 4 extremities Test coordination:  he appeared jittery today with mild involuntary postural tremors and subtle myoclonic jerks noted in his limbs.    Lab Results: Basic Metabolic Panel:  Recent Labs Lab 12/31/15 2227 01/01/16 0449 01/02/16 0806 01/03/16 0049  NA 139 140 140 138  K 3.3* 3.5 3.6 3.8  CL 104 104 106 107  CO2 20*  GLUCOSE 183* 106* 115* 167*  BUN 25* 23* 18 13  CREATININE 1.35* 1.14 1.18 1.14  CALCIUM 9.4 9.2 9.0 9.5    Liver Function Tests:  Recent Labs Lab 12/31/15 2227 01/01/16 0449 01/02/16 0806 01/03/16 0049  AST 18 15 37 58*  ALT 17 14* 16* 24  ALKPHOS 62 56 50 59  BILITOT 0.5 0.7 0.3 1.4*  PROT 6.8 6.1* 5.2* 6.3*  ALBUMIN 3.9 3.5 3.2* 3.5   No results for input(s): LIPASE, AMYLASE in the last 168 hours.  Recent Labs Lab 01/03/16 1215  AMMONIA 39*    CBC:  Recent Labs Lab 12/31/15 2227 01/01/16 0449 01/02/16 0806 01/03/16 0352 01/03/16 1220 01/04/16 0254  WBC 13.7* 11.0* 11.1* 16.6*  --  17.5*  NEUTROABS 10.3*  --   --   --  14.2*  --   HGB 16.0 14.5 13.8 14.8  --  14.5  HCT 47.0 44.0 42.1 44.8  --  44.2  MCV 89.5 90.2 88.6 88.4  --  89.3  PLT 216 220 208 226  --  207    Cardiac Enzymes:  Recent Labs Lab 01/01/16 0857 01/01/16 1429 01/01/16 1840 01/02/16 0155 01/02/16 0806  TROPONINI 2.79* 3.19* 2.29* 1.15* 7.83*    Lipid Panel: No results for input(s): CHOL, TRIG, HDL, CHOLHDL, VLDL, LDLCALC in the last 168 hours.  CBG:  Recent Labs Lab 01/02/16 0804 01/02/16 1953 01/03/16 0841 01/04/16 0755  GLUCAP 96 126* 134* 126*    Microbiology: Results for orders placed or performed during the hospital encounter of 12/31/15  Blood culture (routine x 2)     Status: None  (Preliminary result)   Collection Time: 01/01/16  1:14 AM  Result Value Ref Range Status   Specimen Description RIGHT ANTECUBITAL  Final   Special Requests BOTTLES DRAWN AEROBIC ONLY 6CC  Final   Culture NO GROWTH 3 DAYS  Final   Report Status PENDING  Incomplete  Blood culture (routine x 2)     Status: None (Preliminary result)   Collection Time: 01/01/16  1:25 AM  Result Value Ref Range Status   Specimen Description LEFT ANTECUBITAL  Final   Special Requests BOTTLES DRAWN AEROBIC AND ANAEROBIC 6CC  Final   Culture NO GROWTH 3 DAYS  Final   Report Status PENDING  Incomplete  MRSA PCR Screening     Status: None  Collection Time: 01/01/16  2:45 AM  Result Value Ref Range Status   MRSA by PCR NEGATIVE NEGATIVE Final    Comment:        The GeneXpert MRSA Assay (FDA approved for NASAL specimens only), is one component of a comprehensive MRSA colonization surveillance program. It is not intended to diagnose MRSA infection nor to guide or monitor treatment for MRSA infections.   Culture, blood (routine x 2)     Status: None (Preliminary result)   Collection Time: 01/03/16 12:38 AM  Result Value Ref Range Status   Specimen Description BLOOD RIGHT ARM  Final   Special Requests IN PEDIATRIC BOTTLE 3CC  Final   Culture NO GROWTH 1 DAY  Final   Report Status PENDING  Incomplete  Culture, blood (routine x 2)     Status: None (Preliminary result)   Collection Time: 01/03/16 12:42 AM  Result Value Ref Range Status   Specimen Description BLOOD RIGHT ARM  Final   Special Requests IN PEDIATRIC BOTTLE 3CC  Final   Culture NO GROWTH 1 DAY  Final   Report Status PENDING  Incomplete    Imaging: Ct Head Wo Contrast  01/03/2016  CLINICAL DATA:  Delirium, altered mental status. Recent myocardial infarction, history of hypertension and dyslipidemia. EXAM: CT HEAD WITHOUT CONTRAST TECHNIQUE: Contiguous axial images were obtained from the base of the skull through the vertex without intravenous  contrast. COMPARISON:  MRI of the head August 20, 2015 FINDINGS: The ventricles and sulci are normal for age. No intraparenchymal hemorrhage, mass effect nor midline shift. Patchy supratentorial white matter hypodensities are less than expected for patient's age and though non-specific suggest sequelae of chronic small vessel ischemic disease. No acute large vascular territory infarcts. Old LEFT basal ganglia lacunar infarct. No abnormal extra-axial fluid collections. Basal cisterns are patent. 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 No skull fracture. The included ocular globes and orbital contents are non-suspicious. Status post bilateral ocular lens implants. Mild paranasal sinus mucosal thickening. Mastoid air cells are well aerated. Patient is edentulous. IMPRESSION: No acute intracranial process. Involutional changes and minimal chronic small vessel ischemic disease. Old LEFT basal ganglia lacunar infarct. Electronically Signed   By: Awilda Metro M.D.   On: 01/03/2016 01:46   Dg Chest Port 1 View  01/03/2016  CLINICAL DATA:  Hypoxemia, confusion, fever, asthma, essential hypertension, coronary artery disease post NSTEMI, COPD EXAM: PORTABLE CHEST 1 VIEW COMPARISON:  Portable exam 1007 hours compared to 08/18/2015 FINDINGS: Normal heart size, mediastinal contours, and pulmonary vascularity. Bronchitic changes accentuated interstitial markings. Bibasilar atelectasis versus infiltrate. Upper lungs clear. No pleural effusion or pneumothorax. IMPRESSION: Bronchitic changes with bibasilar atelectasis versus infiltrate. Electronically Signed   By: Ulyses Southward M.D.   On: 01/03/2016 10:19    Assessment and plan:   Keldrick Pomplun [place] is an 75 y.o. male patient with Improving acute delirium with Seroquel. Due to reported sedation earlier this morning, recommend discontinuing the morning and afternoon dose of Seroquel continually the evening 6 PM dose of 25 mg. Patient is noted to be jittery  today compared to yesterday, with mild postural tremors and myoclonic jerks. Not sure about etiology. Patient and family denies any alcohol abuse. Of note mild elevation of the ammonia to 39, slightly increased AST of 58 noted this morning, again unknown etiology for these abnormal tests. Defer further evaluation of this to the primary team.  Overall there has been improvement of his delirium compared to yesterday. His family is happy with  the progress.  We'll follow-up.

## 2016-01-05 NOTE — Progress Notes (Signed)
PT Cancellation Note  Patient Details Name: Lee Chang MRN: [id number] DOB: [birth year]   Cancelled Treatment:    Reason Eval/Treat Not Completed: Fatigue/lethargy limiting ability to participate   Will follow up later today as time allows;  Otherwise, will follow up for PT tomorrow;   Thank you,  Van ClinesHolly Avien Taha, PT  Acute Rehabilitation Services Pager [phone] Office [phone]     Van ClinesGarrigan, Franke Menter [facility] Systemam[facility] 01/05/2016, 4:13 PM

## 2016-01-05 NOTE — Progress Notes (Addendum)
Pharmacy Antibiotic Note  Lee Chang is a 75 y.o. male admitted on 12/31/2015 with aspiration pneumonia.  Pharmacy has been consulted for Zosyn dosing.  Tmax 101, wbc stable at 17.5. SCr within normal limits.   Plan: Consider stopping azithromycin as he has completed 5 days of therapy Zosyn 3.375g IV q8 hours No further adjustments planned with normal renal function, will follow peripherally LOT and plan to de-escalate once cultures return  Height: 5\' 8"  (172.7 cm) Weight: 196 lb 6.9 oz (89.1 kg) IBW/kg (Calculated) : 68.4  Temp (24hrs), Avg:98.5 F (36.9 C), Min:97.5 F (36.4 C), Max:101 F (38.3 C)   Recent Labs Lab 12/31/15 2227 01/01/16 0449 01/02/16 0806 01/03/16 0049 01/03/16 0352 01/03/16 1215 01/04/16 0254 01/05/16 0320  WBC 13.7* 11.0* 11.1*  --  16.6*  --  17.5* 17.5*  CREATININE 1.35* 1.14 1.18 1.14  --   --   --   --   LATICACIDVEN  --   --   --   --   --  0.8  --  1.5    Estimated Creatinine Clearance: 60.7 mL/min (by C-G formula based on Cr of 1.14).    Allergies  Allergen Reactions  . Asa [Aspirin]     Bleeding ulcer    Antimicrobials this admission: Azith 3/11 >>  Ctx  3/11>>3/15  Zosyn 3/15>>  Microbiology results: 3/11 BCx: ngtd 3/13 BCx: Pending 3/11 MRSA PCR: neg  Thank you for allowing pharmacy to be a part of this patient's care.  Sheppard CoilFrank Tanija Germani PharmD., BCPS Clinical Pharmacist Pager [phone] 01/05/2016 9:35 AM

## 2016-01-06 ENCOUNTER — Inpatient Hospital Stay (HOSPITAL_COMMUNITY): Payer: Medicare Other

## 2016-01-06 DIAGNOSIS — G9340 Encephalopathy, unspecified: Secondary | ICD-10-CM

## 2016-01-06 LAB — CBC WITH DIFFERENTIAL/PLATELET
BASOS ABS: 0 10*3/uL (ref 0.0–0.1)
BASOS PCT: 0 %
EOS ABS: 0.3 10*3/uL (ref 0.0–0.7)
Eosinophils Relative: 2 %
HEMATOCRIT: 37.6 % — AB (ref 39.0–52.0)
HEMOGLOBIN: 11.9 g/dL — AB (ref 13.0–17.0)
Lymphocytes Relative: 7 %
Lymphs Abs: 0.9 10*3/uL (ref 0.7–4.0)
MCH: 29 pg (ref 26.0–34.0)
MCHC: 31.6 g/dL (ref 30.0–36.0)
MCV: 91.7 fL (ref 78.0–100.0)
MONOS PCT: 14 %
Monocytes Absolute: 2 10*3/uL — ABNORMAL HIGH (ref 0.1–1.0)
NEUTROS ABS: 10.8 10*3/uL — AB (ref 1.7–7.7)
Neutrophils Relative %: 78 %
Platelets: 179 10*3/uL (ref 150–400)
RBC: 4.1 MIL/uL — AB (ref 4.22–5.81)
RDW: 14.7 % (ref 11.5–15.5)
WBC: 14 10*3/uL — ABNORMAL HIGH (ref 4.0–10.5)

## 2016-01-06 LAB — CULTURE, BLOOD (ROUTINE X 2)
Culture: NO GROWTH
Culture: NO GROWTH

## 2016-01-06 LAB — BASIC METABOLIC PANEL
ANION GAP: 9 (ref 5–15)
BUN: 41 mg/dL — ABNORMAL HIGH (ref 6–20)
CHLORIDE: 115 mmol/L — AB (ref 101–111)
CO2: 23 mmol/L (ref 22–32)
Calcium: 9.2 mg/dL (ref 8.9–10.3)
Creatinine, Ser: 1.55 mg/dL — ABNORMAL HIGH (ref 0.61–1.24)
GFR calc non Af Amer: 42 mL/min — ABNORMAL LOW (ref >60)
GFR, EST AFRICAN AMERICAN: 49 mL/min — AB (ref >60)
Glucose, Bld: 126 mg/dL — ABNORMAL HIGH (ref 65–99)
POTASSIUM: 3.9 mmol/L (ref 3.5–5.1)
SODIUM: 147 mmol/L — AB (ref 135–145)

## 2016-01-06 LAB — HEMOGLOBIN AND HEMATOCRIT, BLOOD
HEMATOCRIT: 39.7 % (ref 39.0–52.0)
HEMOGLOBIN: 12.4 g/dL — AB (ref 13.0–17.0)

## 2016-01-06 LAB — GLUCOSE, CAPILLARY: GLUCOSE-CAPILLARY: 88 mg/dL (ref 65–99)

## 2016-01-06 MED ORDER — POLYETHYLENE GLYCOL 3350 17 G PO PACK
17.0000 g | PACK | Freq: Two times a day (BID) | ORAL | Status: DC
  Administered 2016-01-06 – 2016-01-08 (×5): 17 g via ORAL
  Filled 2016-01-06 (×4): qty 1

## 2016-01-06 MED ORDER — SENNA 8.6 MG PO TABS
1.0000 | ORAL_TABLET | Freq: Every day | ORAL | Status: DC
  Administered 2016-01-07: 8.6 mg via ORAL
  Filled 2016-01-06 (×2): qty 1

## 2016-01-06 MED ORDER — SODIUM CHLORIDE 0.45 % IV SOLN: INTRAVENOUS | Status: DC

## 2016-01-06 MED ORDER — METHYLPREDNISOLONE SODIUM SUCC 40 MG IJ SOLR
40.0000 mg | Freq: Once | INTRAMUSCULAR | Status: AC
  Administered 2016-01-06: 40 mg via INTRAVENOUS
  Filled 2016-01-06: qty 1

## 2016-01-06 NOTE — Procedures (Signed)
History: Lee Chang is an 75 y.o. male patient with altered mental status and myoclonic jerks.  Routine inpatient EEG was performed for further evaluation.   Patient Active Problem List   Diagnosis Date Noted  . Acute delirium   . Altered mental status   . Left arm swelling   . 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 Dyslipidemia, goal LDL below 70 01/02/2016  . Chest pain 01/01/2016  . CAP (community acquired pneumonia) 01/01/2016  . COPD (chronic obstructive pulmonary disease) (HCC) 01/01/2016  . Lung nodule 01/01/2016  . NSTEMI (non-ST elevated myocardial infarction) (HCC) 01/01/2016  . Acute chest pain   . Essential hypertension   . Bradycardia      Current facility-administered medications:  .  0.45 % sodium chloride infusion, , Intravenous, Continuous, Belkys A Regalado, MD, Last Rate: 50 mL/hr at 01/06/16 0834 .  acetaminophen (TYLENOL) tablet 650 mg, 650 mg, Oral, Q6H PRN, 650 mg at 01/05/16 1334 **OR** acetaminophen (TYLENOL) suppository 650 mg, 650 mg, Rectal, Q6H PRN, Meredeth Ide, MD .  albuterol (PROVENTIL) (2.5 MG/3ML) 0.083% nebulizer solution 2.5 mg, 2.5 mg, Nebulization, Q2H PRN, Meredeth Ide, MD .  aspirin EC tablet 81 mg, 81 mg, Oral, Daily, Runell Gess, MD, 81 mg at 01/06/16 1610 .  atorvastatin (LIPITOR) tablet 80 mg, 80 mg, Oral, q1800, Erick Blinks, MD, 80 mg at 01/05/16 2148 .  azithromycin (ZITHROMAX) 500 mg in dextrose 5 % 250 mL IVPB, 500 mg, Intravenous, Q24H, Meredeth Ide, MD, 500 mg at 01/06/16 9604 .  finasteride (PROSCAR) tablet 5 mg, 5 mg, Oral, Daily, Meredeth Ide, MD, 5 mg at 01/06/16 5409 .  food thickener (THICK IT) powder, , Oral, PRN, Vesta Mixer, MD .  guaiFENesin [facility]) 12 hr tablet 600 mg, 600 mg, Oral, BID, Meredeth Ide, MD, 600 mg at 01/06/16 8119 .  hydrALAZINE (APRESOLINE) injection 10 mg, 10 mg,  Intravenous, Q6H PRN, Leeann Must, MD, 10 mg at 01/04/16 1728 .  ipratropium-albuterol (DUONEB) 0.5-2.5 (3) MG/3ML nebulizer solution 3 mL, 3 mL, Nebulization, TID, Vesta Mixer, MD, 3 mL at 01/06/16 0753 .  methylPREDNISolone sodium succinate (SOLU-MEDROL) 40 mg/mL injection 40 mg, 40 mg, Intravenous, Once, Belkys A Regalado, MD .  metoprolol tartrate (LOPRESSOR) tablet 12.5 mg, 12.5 mg, Oral, BID, Runell Gess, MD, 12.5 mg at 01/06/16 1478 .  morphine 2 MG/ML injection 2 mg, 2 mg, Intravenous, Q1H PRN, Marykay Lex, MD .  nitroGLYCERIN (NITROSTAT) SL tablet 0.4 mg, 0.4 mg, Sublingual, Q5 min PRN, Vesta Mixer, MD, 0.4 mg at 01/01/16 2023 .  piperacillin-tazobactam (ZOSYN) IVPB 3.375 g, 3.375 g, Intravenous, 3 times per day, Earnie Larsson, RPH, 3.375 g at 01/06/16 0548 .  polyethylene glycol (MIRALAX / GLYCOLAX) packet 17 g, 17 g, Oral, BID, Belkys A Regalado, MD .  QUEtiapine (SEROQUEL) tablet 25 mg, 25 mg, Oral, q1800, Ronisha Herringshaw Daniel Nones, MD, 25 mg at 01/05/16 2149 .  [START ON 01/07/2016] senna (SENOKOT) tablet 8.6 mg, 1 tablet, Oral, Daily, Belkys A Regalado, MD .  sodium chloride flush (NS) 0.9 % injection 3 mL, 3 mL, Intravenous, Q12H, Meredeth Ide, MD, 3 mL at 01/06/16 0834 .  sodium chloride flush (NS) 0.9 % injection 3 mL, 3 mL, Intravenous, Q12H, Joellyn Rued, MD, 3 mL at 01/06/16 0834 .  tamsulosin (FLOMAX) capsule 0.8  mg, 0.8 mg, Oral, Daily, Meredeth IdeGagan S Lama, MD, 0.8 mg at 01/06/16 [id number] .  ticagrelor (BRILINTA) tablet 90 mg, 90 mg, Oral, BID, Marykay Lexavid W Harding, MD, 90 mg at 01/06/16 [id number]     Introduction:  This is a 19 channel routine scalp EEG performed at the bedside with bipolar and monopolar montages arranged in accordance to the international 10/20 system of electrode placement. One channel was dedicated to EKG recording.   Findings:  Generalized background slowing in the mid theta range is noted.  No definite evidence of abnormal epileptiform discharges or  electrographic seizures were noted during this recording. patient had some intermittent myoclonic jerks on the video recording with no EEG correlate.    Impression:  Abnormal routine inpatient EEG suggestive of mild to moderate encephalopathy. Myoclonic jerks were noted in the video recording with no electrographic correlate and are likely secondary to encephalopathy. No evidence of seizures were seen on the EEG. Clinical correlation is recommended .

## 2016-01-06 NOTE — Progress Notes (Signed)
Subjective:  POD # 5 Inf STEMI Rx with DES X2 RCA with occluded mid PDA secondary to thromboembolism. Major issue has been AMS/ delirium. EEG--> encephalopathy. Much better today. Pt is oriented, alert and conversant  Objective:  Temp:  [97.4 F (36.3 C)-100 F (37.8 C)] 97.4 F (36.3 C) (03/16 1241) Pulse Rate:  [58-97] 71 (03/16 1241) Resp:  [16-20] 20 (03/16 0500) BP: (113-171)/(58-84) 171/65 mmHg (03/16 1241) SpO2:  [87 %-100 %] 87 % (03/16 1241) FiO2 (%):  [3.5 %] 3.5 % (03/15 2131) Weight:  [193 lb 9.6 oz (87.816 kg)] 193 lb 9.6 oz (87.816 kg) (03/16 0600) Weight change: -2 lb 13.3 oz (-1.284 kg)  Intake/Output from previous day: 03/15 0701 - 03/16 0700 In: 1385 [P.O.:60; I.V.:975; IV Piggyback:350] Out: 3331 [Urine:3331]  Intake/Output from this shift:    Physical Exam: General appearance: no distress and slowed mentation Neck: no adenopathy, no carotid bruit, no JVD, supple, symmetrical, trachea midline and thyroid not enlarged, symmetric, no tenderness/mass/nodules Lungs: clear to auscultation bilaterally Heart: regular rate and rhythm, S1, S2 normal, no murmur, click, rub or gallop Extremities: extremities normal, atraumatic, no cyanosis or edema  Lab Results: Results for orders placed or performed during the hospital encounter of 12/31/15 (from the past 48 hour(s))  CBC     Status: Abnormal   Collection Time: 01/05/16  3:20 AM  Result Value Ref Range   WBC 17.5 (H) 4.0 - 10.5 K/uL   RBC 4.76 4.22 - 5.81 MIL/uL   Hemoglobin 13.9 13.0 - 17.0 g/dL   HCT 42.8 39.0 - 52.0 %   MCV 89.9 78.0 - 100.0 fL   MCH 29.2 26.0 - 34.0 pg   MCHC 32.5 30.0 - 36.0 g/dL   RDW 14.5 11.5 - 15.5 %   Platelets 207 150 - 400 K/uL  Procalcitonin     Status: None   Collection Time: 01/05/16  3:20 AM  Result Value Ref Range   Procalcitonin <0.10 ng/mL    Comment:        Interpretation: PCT (Procalcitonin) <= 0.5 ng/mL: Systemic infection (sepsis) is not likely. Local  bacterial infection is possible. (NOTE)         ICU PCT Algorithm               Non ICU PCT Algorithm    ----------------------------     ------------------------------         PCT < 0.25 ng/mL                 PCT < 0.1 ng/mL     Stopping of antibiotics            Stopping of antibiotics       strongly encouraged.               strongly encouraged.    ----------------------------     ------------------------------       PCT level decrease by               PCT < 0.25 ng/mL       >= 80% from peak PCT       OR PCT 0.25 - 0.5 ng/mL          Stopping of antibiotics  encouraged.     Stopping of antibiotics           encouraged.    ----------------------------     ------------------------------       PCT level decrease by              PCT >= 0.25 ng/mL       < 80% from peak PCT        AND PCT >= 0.5 ng/mL            Continuin g antibiotics                                              encouraged.       Continuing antibiotics            encouraged.    ----------------------------     ------------------------------     PCT level increase compared          PCT > 0.5 ng/mL         with peak PCT AND          PCT >= 0.5 ng/mL             Escalation of antibiotics                                          strongly encouraged.      Escalation of antibiotics        strongly encouraged.   Lactic acid, plasma     Status: None   Collection Time: 01/05/16  3:20 AM  Result Value Ref Range   Lactic Acid, Venous 1.5 0.5 - 2.0 mmol/L  Ammonia     Status: None   Collection Time: 01/05/16  3:20 AM  Result Value Ref Range   Ammonia 35 9 - 35 umol/L  Glucose, capillary     Status: Abnormal   Collection Time: 01/05/16  8:26 AM  Result Value Ref Range   Glucose-Capillary 123 (H) 65 - 99 mg/dL   Comment 1 Capillary Specimen   Basic metabolic panel     Status: Abnormal   Collection Time: 01/05/16  8:53 AM  Result Value Ref Range   Sodium 144 135 - 145 mmol/L    Potassium 3.9 3.5 - 5.1 mmol/L   Chloride 111 101 - 111 mmol/L   CO2 21 (L) 22 - 32 mmol/L   Glucose, Bld 140 (H) 65 - 99 mg/dL   BUN 39 (H) 6 - 20 mg/dL   Creatinine, Ser 0.24 (H) 0.61 - 1.24 mg/dL   Calcium 9.3 8.9 - 43.2 mg/dL   GFR calc non Af Amer 31 (L) >60 mL/min   GFR calc Af Amer 36 (L) >60 mL/min    Comment: (NOTE) The eGFR has been calculated using the CKD EPI equation. This calculation has not been validated in all clinical situations. eGFR's persistently <60 mL/min signify possible Chronic Kidney Disease.    Anion gap 12 5 - 15  Hepatic function panel     Status: Abnormal   Collection Time: 01/05/16  8:53 AM  Result Value Ref Range   Total Protein 5.9 (L) 6.5 - 8.1 g/dL   Albumin 3.2 (L) 3.5 - 5.0 g/dL   AST 26 15 - 41 U/L   ALT  18 17 - 63 U/L   Alkaline Phosphatase 49 38 - 126 U/L   Total Bilirubin 0.9 0.3 - 1.2 mg/dL   Bilirubin, Direct 0.2 0.1 - 0.5 mg/dL   Indirect Bilirubin 0.7 0.3 - 0.9 mg/dL  Urinalysis, Routine w reflex microscopic (not at [facility])     Status: Abnormal   Collection Time: 01/05/16 10:25 PM  Result Value Ref Range   Color, Urine RED (A) YELLOW    Comment: BIOCHEMICALS MAY BE AFFECTED BY COLOR   APPearance TURBID (A) CLEAR   Specific Gravity, Urine 1.019 1.005 - 1.030   pH 5.0 5.0 - 8.0   Glucose, UA NEGATIVE NEGATIVE mg/dL   Hgb urine dipstick LARGE (A) NEGATIVE   Bilirubin Urine NEGATIVE NEGATIVE   Ketones, ur NEGATIVE NEGATIVE mg/dL   Protein, ur 30 (A) NEGATIVE mg/dL   Nitrite NEGATIVE NEGATIVE   Leukocytes, UA MODERATE (A) NEGATIVE  Urine microscopic-add on     Status: Abnormal   Collection Time: 01/05/16 10:25 PM  Result Value Ref Range   Squamous Epithelial / LPF 0-5 (A) NONE SEEN   WBC, UA 0-5 0 - 5 WBC/hpf   RBC / HPF TOO NUMEROUS TO COUNT 0 - 5 RBC/hpf   Bacteria, UA FEW (A) NONE SEEN  CBC with Differential/Platelet     Status: Abnormal   Collection Time: 01/06/16  4:59 AM  Result Value Ref Range   WBC 14.0 (H) 4.0 - 10.5  K/uL   RBC 4.10 (L) 4.22 - 5.81 MIL/uL   Hemoglobin 11.9 (L) 13.0 - 17.0 g/dL   HCT 04.1 (L) 41.2 - 05.5 %   MCV 91.7 78.0 - 100.0 fL   MCH 29.0 26.0 - 34.0 pg   MCHC 31.6 30.0 - 36.0 g/dL   RDW 87.9 78.4 - 91.4 %   Platelets 179 150 - 400 K/uL   Neutrophils Relative % 78 %   Neutro Abs 10.8 (H) 1.7 - 7.7 K/uL   Lymphocytes Relative 7 %   Lymphs Abs 0.9 0.7 - 4.0 K/uL   Monocytes Relative 14 %   Monocytes Absolute 2.0 (H) 0.1 - 1.0 K/uL   Eosinophils Relative 2 %   Eosinophils Absolute 0.3 0.0 - 0.7 K/uL   Basophils Relative 0 %   Basophils Absolute 0.0 0.0 - 0.1 K/uL  Basic metabolic panel     Status: Abnormal   Collection Time: 01/06/16  4:59 AM  Result Value Ref Range   Sodium 147 (H) 135 - 145 mmol/L   Potassium 3.9 3.5 - 5.1 mmol/L   Chloride 115 (H) 101 - 111 mmol/L   CO2 23 22 - 32 mmol/L   Glucose, Bld 126 (H) 65 - 99 mg/dL   BUN 41 (H) 6 - 20 mg/dL   Creatinine, Ser 7.60 (H) 0.61 - 1.24 mg/dL   Calcium 9.2 8.9 - 75.9 mg/dL   GFR calc non Af Amer 42 (L) >60 mL/min   GFR calc Af Amer 49 (L) >60 mL/min    Comment: (NOTE) The eGFR has been calculated using the CKD EPI equation. This calculation has not been validated in all clinical situations. eGFR's persistently <60 mL/min signify possible Chronic Kidney Disease.    Anion gap 9 5 - 15  Glucose, capillary     Status: None   Collection Time: 01/06/16  7:45 AM  Result Value Ref Range   Glucose-Capillary 88 65 - 99 mg/dL    Imaging: Imaging results have been reviewed  Tele- NSR w/o arrythmias (I personally reviewed)  Assessment/Plan:   1. Principal Problem: 2.   NSTEMI (non-ST elevated myocardial infarction) ([place]) 3. Active Problems: 4.   CAP (community acquired pneumonia) 29.   COPD (chronic obstructive pulmonary disease) ([facility]) 6.   Lung nodule 7.   Essential hypertension 8.   Bradycardia 9.   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   Presence of  DES x 2 in prox-mid RCA  11.   Dyslipidemia, goal LDL below 70 12.   Altered mental status 13.   Left arm swelling 14.   Acute delirium 15.   Time Spent Directly with Patient:  20 minutes  Length of Stay:  LOS: 5 days   Pt looks much better this AM. Confusion and delirium improved. Cardiac stable. POD # 5 inf STEMI Rx with DES x2. No CP/SOB. Exam benign. Labs OK. SCr improving. Will get PT consult. OOB--> Chair. Begin ambulation. Prob home over the weekend.   Quay Burow 01/06/2016, 2:14 PM
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
## 2016-01-06 NOTE — Progress Notes (Signed)
Interval History:                                                                                                                      Sonda Rumblelmer W Trussell is an 75 y.o. male patient with  improving delirium, no further agitation issues of starting Seroquel. He occasionally has some confusion which gradually his been improving per his daughter. No new neurological symptoms otherwise   Past Medical History: Past Medical History  Diagnosis Date  . Ulcer   . Asthma   . Essential hypertension   . NSTEMI (non-ST elevated myocardial infarction) (HCC) 12/31/2015    Echo 01/01/16 (pre-CATH-PCI): EF 60-65%, no RWMA.   . 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 Moderate OM1 35% & midLAD 45%  . 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 Dyslipidemia, goal LDL below 70 01/02/2016  . COPD (chronic obstructive pulmonary disease) (HCC) 01/01/2016  . Lung nodule 01/01/2016  . Dementia     Past Surgical History  Procedure Laterality Date  . Hernia repair    . Cardiac catheterization N/A 01/01/2016    Procedure: Left Heart Cath and Coronary Angiography;  Surgeon: Marykay Lexavid W Harding, MD;  [facility];  Service: Cardiovascular;  Laterality: N/A;  . Cardiac catheterization Right 01/01/2016    Procedure: Coronary Stent Intervention;  Surgeon: Marykay Lexavid W Harding, MD;  [facility];  Service: Cardiovascular;  Laterality: Right;    Family History: History reviewed. No pertinent family history.  Social History:   reports that he has never smoked. He does not have any smokeless tobacco history on file. He reports that he does not drink alcohol. His drug history is not on file.  Allergies:  Allergies  Allergen Reactions  .  Asa [Aspirin]     Bleeding ulcer     Medications:                                                                                                                         Current facility-administered medications:  .  0.45 % sodium chloride infusion, , Intravenous, Continuous, Belkys A Regalado, MD, Last Rate: 50 mL/hr at 01/06/16 0834 .  acetaminophen (  TYLENOL) tablet 650 mg, 650 mg, Oral, Q6H PRN, 650 mg at 01/05/16 1334 **OR** acetaminophen (TYLENOL) suppository 650 mg, 650 mg, Rectal, Q6H PRN, Meredeth Ide, MD .  albuterol (PROVENTIL) (2.5 MG/3ML) 0.083% nebulizer solution 2.5 mg, 2.5 mg, Nebulization, Q2H PRN, Meredeth Ide, MD .  aspirin EC tablet 81 mg, 81 mg, Oral, Daily, Runell Gess, MD, 81 mg at 01/06/16 1610 .  atorvastatin (LIPITOR) tablet 80 mg, 80 mg, Oral, q1800, Erick Blinks, MD, 80 mg at 01/06/16 1734 .  azithromycin (ZITHROMAX) 500 mg in dextrose 5 % 250 mL IVPB, 500 mg, Intravenous, Q24H, Meredeth Ide, MD, 500 mg at 01/06/16 9604 .  finasteride (PROSCAR) tablet 5 mg, 5 mg, Oral, Daily, Meredeth Ide, MD, 5 mg at 01/06/16 5409 .  food thickener (THICK IT) powder, , Oral, PRN, Vesta Mixer, MD .  guaiFENesin [facility]) 12 hr tablet 600 mg, 600 mg, Oral, BID, Meredeth Ide, MD, 600 mg at 01/06/16 2247 .  hydrALAZINE (APRESOLINE) injection 10 mg, 10 mg, Intravenous, Q6H PRN, Leeann Must, MD, 10 mg at 01/04/16 1728 .  ipratropium-albuterol (DUONEB) 0.5-2.5 (3) MG/3ML nebulizer solution 3 mL, 3 mL, Nebulization, TID, Vesta Mixer, MD, 3 mL at 01/06/16 1447 .  metoprolol tartrate (LOPRESSOR) tablet 12.5 mg, 12.5 mg, Oral, BID, Runell Gess, MD, 12.5 mg at 01/06/16 2247 .  morphine 2 MG/ML injection 2 mg, 2 mg, Intravenous, Q1H PRN, Marykay Lex, MD .  nitroGLYCERIN (NITROSTAT) SL tablet 0.4 mg, 0.4 mg, Sublingual, Q5 min PRN, Vesta Mixer, MD, 0.4 mg at 01/01/16 2023 .  piperacillin-tazobactam (ZOSYN) IVPB 3.375 g, 3.375 g, Intravenous, 3 times per day, Earnie Larsson, RPH, 3.375 g at 01/06/16 1738 .  polyethylene glycol (MIRALAX / GLYCOLAX) packet 17 g, 17 g, Oral, BID, Belkys A Regalado, MD, 17 g at 01/06/16 2247 .  QUEtiapine (SEROQUEL) tablet 25 mg, 25 mg, Oral, q1800, [place] Daniel Nones, MD, 25 mg at 01/06/16 1734 .  [START ON 01/07/2016] senna (SENOKOT) tablet 8.6 mg, 1 tablet, Oral, Daily, Belkys A Regalado, MD .  sodium chloride flush (NS) 0.9 % injection 3 mL, 3 mL, Intravenous, Q12H, Meredeth Ide, MD, 3 mL at 01/06/16 0834 .  sodium chloride flush (NS) 0.9 % injection 3 mL, 3 mL, Intravenous, Q12H, Joellyn Rued, MD, 3 mL at 01/06/16 2249 .  tamsulosin (FLOMAX) capsule 0.8 mg, 0.8 mg, Oral, Daily, Meredeth Ide, MD, 0.8 mg at 01/06/16 0832 .  ticagrelor (BRILINTA) tablet 90 mg, 90 mg, Oral, BID, Marykay Lex, MD, 90 mg at 01/06/16 2248   Neurologic Examination:                                                                                                     Today's Vitals   01/06/16 1447 01/06/16 1746 01/06/16 2059 01/06/16 2247  BP:  175/82 167/72 167/72  Pulse:  85 70 70  Temp:  99.3 F (37.4 C) 98.4 F (36.9 C)   TempSrc:  Axillary Oral   Resp:  18 16  Height:      Weight:      SpO2: 94% 99% 100%   PainSc:        Evaluation of higher integrative functions including: Level of alertness: Alert,  Oriented to time, place and person Speech: fluent, no evidence of dysarthria or aphasia noted.  Cognitive evaluation revealed poor attention unable to spell the word milk backwards, had trouble with simple calculations, had trouble copying finger gestures Test the following cranial nerves: 2-12 grossly intact Motor examination: Normal tone, bulk, full 5/5 motor strength in all 4 extremities Test coordination: Normal finger nose testing, mild asterixis noted   Lab Results: Basic Metabolic Panel:  Recent Labs Lab 01/01/16 0449 01/02/16 0806 01/03/16 0049 01/05/16 0853 01/06/16 0459  NA 140 140 138 144 147*  K  3.5 3.6 3.8 3.9 3.9  CL 104 106 107 111 115*  CO2 30 25 20* 21* 23  GLUCOSE 106* 115* 167* 140* 126*  BUN 23* 18 13 39* 41*  CREATININE 1.14 1.18 1.14 1.98* 1.55*  CALCIUM 9.2 9.0 9.5 9.3 9.2    Liver Function Tests:  Recent Labs Lab 12/31/15 2227 01/01/16 0449 01/02/16 0806 01/03/16 0049 01/05/16 0853  AST 18 15 37 58* 26  ALT 17 14* 16* 24 18  ALKPHOS 62 56 50 59 49  BILITOT 0.5 0.7 0.3 1.4* 0.9  PROT 6.8 6.1* 5.2* 6.3* 5.9*  ALBUMIN 3.9 3.5 3.2* 3.5 3.2*   No results for input(s): LIPASE, AMYLASE in the last 168 hours.  Recent Labs Lab 01/03/16 1215 01/05/16 0320  AMMONIA 39* 35    CBC:  Recent Labs Lab 12/31/15 2227  01/02/16 0806 01/03/16 0352 01/03/16 1220 01/04/16 0254 01/05/16 0320 01/06/16 0459 01/06/16 1843  WBC 13.7*  < > 11.1* 16.6*  --  17.5* 17.5* 14.0*  --   NEUTROABS 10.3*  --   --   --  14.2*  --   --  10.8*  --   HGB 16.0  < > 13.8 14.8  --  14.5 13.9 11.9* 12.4*  HCT 47.0  < > 42.1 44.8  --  44.2 42.8 37.6* 39.7  MCV 89.5  < > 88.6 88.4  --  89.3 89.9 91.7  --   PLT 216  < > 208 226  --  207 207 179  --   < > = values in this interval not displayed.  Cardiac Enzymes:  Recent Labs Lab 01/01/16 0857 01/01/16 1429 01/01/16 1840 01/02/16 0155 01/02/16 0806  TROPONINI 2.79* 3.19* 2.29* 1.15* 7.83*    Lipid Panel: No results for input(s): CHOL, TRIG, HDL, CHOLHDL, VLDL, LDLCALC in the last 168 hours.  CBG:  Recent Labs Lab 01/02/16 1953 01/03/16 0841 01/04/16 0755 01/05/16 0826 01/06/16 0745  GLUCAP 126* 134* 126* 123* 88    Microbiology: Results for orders placed or performed during the hospital encounter of 12/31/15  Blood culture (routine x 2)     Status: None   Collection Time: 01/01/16  1:14 AM  Result Value Ref Range Status   Specimen Description BLOOD RIGHT ANTECUBITAL DRAWN BY RN  Final   Special Requests BOTTLES DRAWN AEROBIC AND ANAEROBIC 6CC  Final   Culture NO GROWTH 5 DAYS  Final   Report Status  01/06/2016 FINAL  Final  Blood culture (routine x 2)     Status: None   Collection Time: 01/01/16  1:25 AM  Result Value Ref Range Status   Specimen Description BLOOD LEFT ANTECUBITAL  Final   Special Requests BOTTLES  DRAWN AEROBIC ONLY 6CC  Final   Culture NO GROWTH 5 DAYS  Final   Report Status 01/06/2016 FINAL  Final  MRSA PCR Screening     Status: None   Collection Time: 01/01/16  2:45 AM  Result Value Ref Range Status   MRSA by PCR NEGATIVE NEGATIVE Final    Comment:        The GeneXpert MRSA Assay (FDA approved for NASAL specimens only), is one component of a comprehensive MRSA colonization surveillance program. It is not intended to diagnose MRSA infection nor to guide or monitor treatment for MRSA infections.   Culture, blood (routine x 2)     Status: None (Preliminary result)   Collection Time: 01/03/16 12:38 AM  Result Value Ref Range Status   Specimen Description BLOOD RIGHT ARM  Final   Special Requests IN PEDIATRIC BOTTLE 3CC  Final   Culture NO GROWTH 3 DAYS  Final   Report Status PENDING  Incomplete  Culture, blood (routine x 2)     Status: None (Preliminary result)   Collection Time: 01/03/16 12:42 AM  Result Value Ref Range Status   Specimen Description BLOOD RIGHT ARM  Final   Special Requests IN PEDIATRIC BOTTLE 3CC  Final   Culture NO GROWTH 3 DAYS  Final   Report Status PENDING  Incomplete    Imaging: Dg Chest 1 View  01/05/2016  CLINICAL DATA:  Leukocytosis.  Confusion. EXAM: CHEST 1 VIEW COMPARISON:  01/03/2016 FINDINGS: Single view of the chest demonstrates prominent lung markings which are unchanged and appear to be chronic. There is no focal airspace disease or pulmonary edema. Heart and mediastinum are within normal limits and stable. Osseous structures appear to be intact. IMPRESSION: No acute chest findings. Electronically Signed   By: Richarda Overlie M.D.   On: 01/05/2016 10:06   Ct Head Wo Contrast  01/03/2016  CLINICAL DATA:  Delirium, altered  mental status. Recent myocardial infarction, history of hypertension and dyslipidemia. EXAM: CT HEAD WITHOUT CONTRAST TECHNIQUE: Contiguous axial images were obtained from the base of the skull through the vertex without intravenous contrast. COMPARISON:  MRI of the head August 20, 2015 FINDINGS: The ventricles and sulci are normal for age. No intraparenchymal hemorrhage, mass effect nor midline shift. Patchy supratentorial white matter hypodensities are less than expected for patient's age and though non-specific suggest sequelae of chronic small vessel ischemic disease. No acute large vascular territory infarcts. Old LEFT basal ganglia lacunar infarct. No abnormal extra-axial fluid collections. Basal cisterns are patent. 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 No skull fracture. The included ocular globes and orbital contents are non-suspicious. Status post bilateral ocular lens implants. Mild paranasal sinus mucosal thickening. Mastoid air cells are well aerated. Patient is edentulous. IMPRESSION: No acute intracranial process. Involutional changes and minimal chronic small vessel ischemic disease. Old LEFT basal ganglia lacunar infarct. Electronically Signed   By: Awilda Metro M.D.   On: 01/03/2016 01:46   Dg Chest Port 1 View  01/03/2016  CLINICAL DATA:  Hypoxemia, confusion, fever, asthma, essential hypertension, coronary artery disease post NSTEMI, COPD EXAM: PORTABLE CHEST 1 VIEW COMPARISON:  Portable exam 1007 hours compared to 08/18/2015 FINDINGS: Normal heart size, mediastinal contours, and pulmonary vascularity. Bronchitic changes accentuated interstitial markings. Bibasilar atelectasis versus infiltrate. Upper lungs clear. No pleural effusion or pneumothorax. IMPRESSION: Bronchitic changes with bibasilar atelectasis versus infiltrate. Electronically Signed   By: Ulyses Southward M.D.   On: 01/03/2016 10:19   US Abdomen Limited Ruq  01/06/2016  CLINICAL DATA:  Abnormal  transaminase levels.  Fever.  Hypertension. EXAM: US ABDOMEN LIMITED - RIGHT UPPER QUADRANT COMPARISON:  12/31/2015 FINDINGS: Gallbladder: Difficult to assess due to difficulty with positioning. Dependent echogenicity in the gallbladder on image 6, probably a gallstone but with only limited if any shadowing. This was not well seen on CT. Gallbladder wall thickness within normal limits. Sonographic Murphy's sign absent. Common bile duct: Diameter: 3 mm Liver: Difficulty observing parts the left lobe due to bowel gas and gastric position. Visualized liver unremarkable. IMPRESSION: 1. Suspected 8 mm gallstone, although there was poor shadowing from this dependent hyperechogenicity, and mobility was difficult to assess. It is possible that this actually represents tumefactive sludge given the lack of definite shadowing. 2. No gallbladder wall thickening or biliary dilatation. 3. Liver appears unremarkable although visualization of parts of the left lobe was problematic due to the orientation of the left lobe with respect to the stomach. Electronically Signed   By: Gaylyn Rong M.D.   On: 01/06/2016 16:43    Assessment and plan:   Bern Fare [place] is an 75 y.o. male patient with with improving encephalopathy and delirium currently on Seroquel 25 mg at 6 PM, tolerating well. No further agitation issues. He does have some clinical evidence for mild encephalopathy as described above, with mild residual asterixis. Etiology for encephalopathy appears to be multifactorial including metabolic causes and possible hypoxic encephalopathy. Repeat EEG showed evidence of encephalopathy, no seizures . Overall his neurological status has been improving. No further change in his medications recommended.   Neurology will sign off. Please call for any further questions

## 2016-01-06 NOTE — Progress Notes (Signed)
PROGRESS NOTE  Lee Chang ZOX:[id number] DOB: January 14, 1941 DOA: 12/31/2015 PCP: [name], MD  Assessment/Plan: Subjective; He doesn't notice improvement on dyspnea.  He relates sporadic cough. No BM since last few days.    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 Delirium,.Confusion, tremors.  CT head no acute findings, EEG nonspecific likely multifactorial, including delirium, dehydration, metabolic abnormalities, fever.  Neurology following, exelon stopped on 3/13 (per daughter patient was started on exelon in 11/2015), neurology started seroquel on 3/13will defer neurology for meds adjustment. Improving.   Hematuria:  UA with no significant WBC. Suspect traumatic in setting of blood thinner.  Monitor hb.   Fever, Leukocytosis.  Fever trending down. WBC trending down.   Change to Zosyn 3-15  Repeat Chest x ray/ no acute finding.  Check RUQ [place] due to initial presentation of mild increase bili and AST  AKI;  Hold cozaar, continue with IV fluids.  Might be multifactorial. Mild hypernatremia, change fluids to half NS>    Mildly elevated ammonia level, started on lactulose.  Ammonia level normalized.   COPD exacerbation/CAP: continue with nebulizer, repeat another dose of solumedrol.   CAD/HTN, NSTEMI S/p R cardiac catheterization due to thromboemboli mid-distal rPDA (Pt had 18 hrs Aggrastat IV) with infero- apical wall motion abnormality. 2 D Echo Nl LVF, EF of 60-65% EKG w/o acute changes.  Plan per cardiology  Left  lung nodule Ct chest 3/11 showed Lobular right apical pulmonary nodule measures 2.2 x 1.5 x 1.3 cm with minimal spiculation, concerning for primary bronchogenic neoplasm. There is no adenopathy. Other than a subcentimeter lucency in T4 which is nonspecific, no evidence of metastatic disease For PET as OP.   Left arm swelling with ecchymoses. These findings are new, could be due to recent anticoagulation during cath LUE [place] no DVT   Dementia Per family patient was started on aricept, then exelon this year Diet modified per  swallow evaluation, on aspiration precaution   Code Status: DNR, confirmed with daughter who is in room  Family Communication: patient and daughter in room  Disposition Plan: per cardiology (primary team)    Consultants:  Cardiology primary  [facility] consultant for altered mental status, leukocytosis  Neurology consultant for confusion  Procedures:  Cardiac cath on 3/11  EEG   Antibiotics:  Rocephin/zithro from admission---change to Zosyn 3-15   Objective: BP 163/65 mmHg  Pulse 96  Temp(Src) 98.1 F (36.7 C) (Oral)  Resp 20  Ht  (1.727 m)  Wt 87.816 kg (193 lb 9.6 oz)  BMI 29.44 kg/m2  SpO2 96%  Intake/Output Summary (Last 24 hours) at 01/06/16 0937 Last data filed at 01/06/16 0600  Gross per 24 hour  Intake   1310 ml  Output   3331 ml  Net  -2021 ml   Filed Weights   01/04/16 0500 01/05/16 0428 01/06/16 0600  Weight: 86.274 kg (190 lb 3.2 oz) 89.1 kg (196 lb 6.9 oz) 87.816 kg (193 lb 9.6 oz)    Exam:   General:  Lethargic, answer some questions.  Cardiovascular: RRR  Respiratory: Bilateral Wheezing.   Abdomen: Soft/ND/NT, positive BS  Musculoskeletal: No Edema  Neuro:  Lethargic, answer some questions. Mild tremors,   Data Reviewed: Basic Metabolic Panel:  Recent Labs Lab 01/01/16 0449 01/02/16 0806 01/03/16 0049 01/05/16 0853 01/06/16 0459  NA 140 140 138 144 147*  K 3.5 3.6 3.8 3.9 3.9  CL 104 106 107 111  115*  CO2 30 25 20* 21* 23  GLUCOSE 106* 115* 167* 140* 126*  BUN 23* 18 13 39* 41*  CREATININE 1.14 1.18 1.14 1.98* 1.55*  CALCIUM 9.2 9.0 9.5 9.3 9.2   Liver Function Tests:  Recent Labs Lab 12/31/15 2227 01/01/16 0449 01/02/16 0806 01/03/16 0049 01/05/16 0853  AST 18 15 37 58* 26  ALT 17 14* 16* 24 18  ALKPHOS 62 56 50 59 49  BILITOT 0.5 0.7 0.3 1.4* 0.9  PROT 6.8 6.1* 5.2* 6.3* 5.9*  ALBUMIN 3.9 3.5 3.2* 3.5 3.2*   No results for input(s): LIPASE, AMYLASE in the last 168 hours.  Recent Labs Lab 01/03/16 1215 01/05/16 0320  AMMONIA 39* 35   CBC:  Recent Labs Lab 12/31/15 2227  01/02/16 0806 01/03/16 0352 01/03/16 1220 01/04/16 0254 01/05/16 0320 01/06/16 0459  WBC 13.7*  < > 11.1* 16.6*  --  17.5* 17.5* 14.0*  NEUTROABS 10.3*  --   --   --  14.2*  --   --  10.8*  HGB 16.0  < > 13.8 14.8  --  14.5 13.9 11.9*  HCT 47.0  < > 42.1 44.8  --  44.2 42.8 37.6*  MCV 89.5  < > 88.6 88.4  --  89.3 89.9 91.7  PLT 216  < > 208 226  --  207 207 179  < > = values in this interval not displayed. Cardiac Enzymes:    Recent Labs Lab 01/01/16 0857 01/01/16 1429 01/01/16 1840 01/02/16 0155 01/02/16 0806  TROPONINI 2.79* 3.19* 2.29* 1.15* 7.83*   BNP (last 3 results)  Recent Labs  01/01/16 1840  BNP 36.1    ProBNP (last 3 results) No results for input(s): PROBNP in the last 8760 hours.  CBG:  Recent Labs Lab 01/02/16 1953 01/03/16 0841 01/04/16 0755 01/05/16 0826 01/06/16 0745  GLUCAP 126* 134* 126* 123* 88    Recent Results (from the past 240 hour(s))  Blood culture (routine x 2)     Status: None (Preliminary result)   Collection Time: 01/01/16  1:14 AM  Result Value Ref Range Status   Specimen Description BLOOD RIGHT ANTECUBITAL DRAWN BY RN  Final   Special Requests BOTTLES DRAWN AEROBIC AND ANAEROBIC 6CC  Final   Culture NO GROWTH 4 DAYS  Final   Report Status PENDING  Incomplete  Blood culture (routine x 2)     Status: None (Preliminary  result)   Collection Time: 01/01/16  1:25 AM  Result Value Ref Range Status   Specimen Description BLOOD LEFT ANTECUBITAL  Final   Special Requests BOTTLES DRAWN AEROBIC ONLY 6CC  Final   Culture NO GROWTH 4 DAYS  Final   Report Status PENDING  Incomplete  MRSA PCR Screening     Status: None   Collection Time: 01/01/16  2:45 AM  Result Value Ref Range Status   MRSA by PCR NEGATIVE NEGATIVE Final    Comment:        The GeneXpert MRSA Assay (FDA approved for NASAL specimens only), is one component of a comprehensive MRSA colonization surveillance program. It is not  intended to diagnose MRSA infection nor to guide or monitor treatment for MRSA infections.   Culture, blood (routine x 2)     Status: None (Preliminary result)   Collection Time: 01/03/16 12:38 AM  Result Value Ref Range Status   Specimen Description BLOOD RIGHT ARM  Final   Special Requests IN PEDIATRIC BOTTLE 3CC  Final   Culture NO GROWTH 2 DAYS  Final   Report Status PENDING  Incomplete  Culture, blood (routine x 2)     Status: None (Preliminary result)   Collection Time: 01/03/16 12:42 AM  Result Value Ref Range Status   Specimen Description BLOOD RIGHT ARM  Final   Special Requests IN PEDIATRIC BOTTLE 3CC  Final   Culture NO GROWTH 2 DAYS  Final   Report Status PENDING  Incomplete     Studies: Dg Chest 1 View  01/05/2016  CLINICAL DATA:  Leukocytosis.  Confusion. EXAM: CHEST 1 VIEW COMPARISON:  01/03/2016 FINDINGS: Single view of the chest demonstrates prominent lung markings which are unchanged and appear to be chronic. There is no focal airspace disease or pulmonary edema. Heart and mediastinum are within normal limits and stable. Osseous structures appear to be intact. IMPRESSION: No acute chest findings. Electronically Signed   By: Richarda Overlie M.D.   On: 01/05/2016 10:06    Scheduled Meds: . aspirin EC  81 mg Oral Daily  . atorvastatin  80 mg Oral q1800  . azithromycin  500 mg Intravenous Q24H  .  finasteride  5 mg Oral Daily  . guaiFENesin  600 mg Oral BID  . ipratropium-albuterol  3 mL Nebulization TID  . methylPREDNISolone (SOLU-MEDROL) injection  40 mg Intravenous Once  . metoprolol tartrate  12.5 mg Oral BID  . piperacillin-tazobactam (ZOSYN)  IV  3.375 g Intravenous 3 times per day  . polyethylene glycol  17 g Oral BID  . QUEtiapine  25 mg Oral q1800  . [START ON 01/07/2016] senna  1 tablet Oral Daily  . sodium chloride flush  3 mL Intravenous Q12H  . sodium chloride flush  3 mL Intravenous Q12H  . tamsulosin  0.8 mg Oral Daily  . ticagrelor  90 mg Oral BID    Continuous Infusions: . sodium chloride 50 mL/hr at 01/06/16 0834     Time spent:  Hartley Barefoot A MD [facility] Pager [phone] 7PM-7AM, please contact night-coverage at [email or web address], password [facility] 01/06/2016, 9:37 AM  LOS: 5 days

## 2016-01-06 NOTE — Progress Notes (Addendum)
Report received in patient's room using SBAR format via Rey RN reviewed orders, labs, VS, meds and patient's general condition, assumed care of patient.

## 2016-01-07 LAB — COMPREHENSIVE METABOLIC PANEL
ALK PHOS: 40 U/L (ref 38–126)
ALT: 15 U/L — AB (ref 17–63)
AST: 15 U/L (ref 15–41)
Albumin: 2.7 g/dL — ABNORMAL LOW (ref 3.5–5.0)
Anion gap: 7 (ref 5–15)
BILIRUBIN TOTAL: 0.7 mg/dL (ref 0.3–1.2)
BUN: 30 mg/dL — AB (ref 6–20)
CO2: 27 mmol/L (ref 22–32)
CREATININE: 1.33 mg/dL — AB (ref 0.61–1.24)
Calcium: 9.5 mg/dL (ref 8.9–10.3)
Chloride: 117 mmol/L — ABNORMAL HIGH (ref 101–111)
GFR calc Af Amer: 59 mL/min — ABNORMAL LOW (ref >60)
GFR, EST NON AFRICAN AMERICAN: 51 mL/min — AB (ref >60)
Glucose, Bld: 127 mg/dL — ABNORMAL HIGH (ref 65–99)
Potassium: 3.8 mmol/L (ref 3.5–5.1)
Sodium: 151 mmol/L — ABNORMAL HIGH (ref 135–145)
TOTAL PROTEIN: 5.3 g/dL — AB (ref 6.5–8.1)

## 2016-01-07 LAB — CBC
HEMATOCRIT: 38.9 % — AB (ref 39.0–52.0)
Hemoglobin: 12 g/dL — ABNORMAL LOW (ref 13.0–17.0)
MCH: 28.6 pg (ref 26.0–34.0)
MCHC: 30.8 g/dL (ref 30.0–36.0)
MCV: 92.8 fL (ref 78.0–100.0)
Platelets: 199 10*3/uL (ref 150–400)
RBC: 4.19 MIL/uL — AB (ref 4.22–5.81)
RDW: 14.5 % (ref 11.5–15.5)
WBC: 11.7 10*3/uL — AB (ref 4.0–10.5)

## 2016-01-07 LAB — URINE CULTURE: Culture: NO GROWTH

## 2016-01-07 LAB — GLUCOSE, CAPILLARY: GLUCOSE-CAPILLARY: 160 mg/dL — AB (ref 65–99)

## 2016-01-07 MED ORDER — BISACODYL 10 MG RE SUPP
10.0000 mg | Freq: Once | RECTAL | Status: AC
  Administered 2016-01-07: 10 mg via RECTAL
  Filled 2016-01-07: qty 1

## 2016-01-07 MED ORDER — DEXTROSE 5 % IV SOLN
INTRAVENOUS | Status: DC
  Administered 2016-01-07: 15:00:00 via INTRAVENOUS
  Administered 2016-01-08 – 2016-01-09 (×2): 100 mL via INTRAVENOUS
  Administered 2016-01-10 – 2016-01-11 (×2): via INTRAVENOUS

## 2016-01-07 NOTE — Progress Notes (Signed)
Pt 94% to 97% on room air, 02 BNC left set up for PRN use, will continue monitor closely.  Katisha Shimizu RN. 

## 2016-01-07 NOTE — Progress Notes (Signed)
Report received in patient's room and Gwen RN updated using SBAR fomat, reviewed new orders, VS, meds and patient's general condition and events of the day, assumed care of patient.

## 2016-01-07 NOTE — Progress Notes (Signed)
Subjective:  No CP/SOB. POD #6 Inf STEMI Rx with PCI/DES X2 . Preserved LVEF. Major issue was AMS/delirium which has markedly improved  Objective:  Temp:  [97.4 F (36.3 C)-99.3 F (37.4 C)] 97.9 F (36.6 C) (03/17 0757) Pulse Rate:  [58-85] 65 (03/17 0757) Resp:  [16-20] 18 (03/17 0757) BP: (147-182)/(55-82) 182/75 mmHg (03/17 0757) SpO2:  [87 %-100 %] 97 % (03/17 0807) Weight:  [202 lb 11.2 oz (91.944 kg)] 202 lb 11.2 oz (91.944 kg) (03/17 0409) Weight change: 9 lb 1.6 oz (4.128 kg)  Intake/Output from previous day: 03/16 0701 - 03/17 0700 In: 1412.9 [P.O.:240; I.V.:872.9; IV Piggyback:300] Out: 2975 [Urine:2975]  Intake/Output from this shift:    Physical Exam: General appearance: alert and no distress Neck: no adenopathy, no carotid bruit, no JVD, supple, symmetrical, trachea midline and thyroid not enlarged, symmetric, no tenderness/mass/nodules Lungs: clear to auscultation bilaterally Heart: regular rate and rhythm, S1, S2 normal, no murmur, click, rub or gallop Extremities: extremities normal, atraumatic, no cyanosis or edema  Lab Results: Results for orders placed or performed during the hospital encounter of 12/31/15 (from the past 48 hour(s))  Urinalysis, Routine w reflex microscopic (not at [facility])     Status: Abnormal   Collection Time: 01/05/16 10:25 PM  Result Value Ref Range   Color, Urine RED (A) YELLOW    Comment: BIOCHEMICALS MAY BE AFFECTED BY COLOR   APPearance TURBID (A) CLEAR   Specific Gravity, Urine 1.019 1.005 - 1.030   pH 5.0 5.0 - 8.0   Glucose, UA NEGATIVE NEGATIVE mg/dL   Hgb urine dipstick LARGE (A) NEGATIVE   Bilirubin Urine NEGATIVE NEGATIVE   Ketones, ur NEGATIVE NEGATIVE mg/dL   Protein, ur 30 (A) NEGATIVE mg/dL   Nitrite NEGATIVE NEGATIVE   Leukocytes, UA MODERATE (A) NEGATIVE  Urine microscopic-add on     Status: Abnormal   Collection Time: 01/05/16 10:25 PM  Result Value Ref Range   Squamous Epithelial / LPF 0-5 (A) NONE  SEEN   WBC, UA 0-5 0 - 5 WBC/hpf   RBC / HPF TOO NUMEROUS TO COUNT 0 - 5 RBC/hpf   Bacteria, UA FEW (A) NONE SEEN  CBC with Differential/Platelet     Status: Abnormal   Collection Time: 01/06/16  4:59 AM  Result Value Ref Range   WBC 14.0 (H) 4.0 - 10.5 K/uL   RBC 4.10 (L) 4.22 - 5.81 MIL/uL   Hemoglobin 11.9 (L) 13.0 - 17.0 g/dL   HCT 37.6 (L) 39.0 - 52.0 %   MCV 91.7 78.0 - 100.0 fL   MCH 29.0 26.0 - 34.0 pg   MCHC 31.6 30.0 - 36.0 g/dL   RDW 14.7 11.5 - 15.5 %   Platelets 179 150 - 400 K/uL   Neutrophils Relative % 78 %   Neutro Abs 10.8 (H) 1.7 - 7.7 K/uL   Lymphocytes Relative 7 %   Lymphs Abs 0.9 0.7 - 4.0 K/uL   Monocytes Relative 14 %   Monocytes Absolute 2.0 (H) 0.1 - 1.0 K/uL   Eosinophils Relative 2 %   Eosinophils Absolute 0.3 0.0 - 0.7 K/uL   Basophils Relative 0 %   Basophils Absolute 0.0 0.0 - 0.1 K/uL  Basic metabolic panel     Status: Abnormal   Collection Time: 01/06/16  4:59 AM  Result Value Ref Range   Sodium 147 (H) 135 - 145 mmol/L   Potassium 3.9 3.5 - 5.1 mmol/L   [place] 115 (H) 101 - 111 mmol/L  CO2 23 22 - 32 mmol/L   Glucose, Bld 126 (H) 65 - 99 mg/dL   BUN 41 (H) 6 - 20 mg/dL   Creatinine, Ser 1.55 (H) 0.61 - 1.24 mg/dL   Calcium 9.2 8.9 - 10.3 mg/dL   GFR calc non Af Amer 42 (L) >60 mL/min   GFR calc Af Amer 49 (L) >60 mL/min    Comment: (NOTE) The eGFR has been calculated using the CKD EPI equation. This calculation has not been validated in all clinical situations. eGFR's persistently <60 mL/min signify possible Chronic Kidney Disease.    Anion gap 9 5 - 15  Glucose, capillary     Status: None   Collection Time: 01/06/16  7:45 AM  Result Value Ref Range   Glucose-Capillary 88 65 - 99 mg/dL  Hemoglobin and hematocrit, blood     Status: Abnormal   Collection Time: 01/06/16  6:43 PM  Result Value Ref Range   Hemoglobin 12.4 (L) 13.0 - 17.0 g/dL   HCT 39.7 39.0 - 52.0 %  Glucose, capillary     Status: Abnormal   Collection Time:  01/07/16  4:08 AM  Result Value Ref Range   Glucose-Capillary 160 (H) 65 - 99 mg/dL  CBC     Status: Abnormal   Collection Time: 01/07/16  5:30 AM  Result Value Ref Range   WBC 11.7 (H) 4.0 - 10.5 K/uL   RBC 4.19 (L) 4.22 - 5.81 MIL/uL   Hemoglobin 12.0 (L) 13.0 - 17.0 g/dL   HCT 38.9 (L) 39.0 - 52.0 %   MCV 92.8 78.0 - 100.0 fL   MCH 28.6 26.0 - 34.0 pg   MCHC 30.8 30.0 - 36.0 g/dL   RDW 14.5 11.5 - 15.5 %   Platelets 199 150 - 400 K/uL  Comprehensive metabolic panel     Status: Abnormal   Collection Time: 01/07/16  5:30 AM  Result Value Ref Range   Sodium 151 (H) 135 - 145 mmol/L   Potassium 3.8 3.5 - 5.1 mmol/L   [place] 117 (H) 101 - 111 mmol/L   CO2 27 22 - 32 mmol/L   Glucose, Bld 127 (H) 65 - 99 mg/dL   BUN 30 (H) 6 - 20 mg/dL   Creatinine, Ser 1.33 (H) 0.61 - 1.24 mg/dL   Calcium 9.5 8.9 - 10.3 mg/dL   Total Protein 5.3 (L) 6.5 - 8.1 g/dL   Albumin 2.7 (L) 3.5 - 5.0 g/dL   AST 15 15 - 41 U/L   ALT 15 (L) 17 - 63 U/L   Alkaline Phosphatase 40 38 - 126 U/L   Total Bilirubin 0.7 0.3 - 1.2 mg/dL   GFR calc non Af Amer 51 (L) >60 mL/min   GFR calc Af Amer 59 (L) >60 mL/min    Comment: (NOTE) The eGFR has been calculated using the CKD EPI equation. This calculation has not been validated in all clinical situations. eGFR's persistently <60 mL/min signify possible Chronic Kidney Disease.    Anion gap 7 5 - 15    Imaging: Imaging results have been reviewed  Tele- NSR in 60s ( I have personally reviewed)  Assessment/Plan:   1. Principal Problem: 2.   NSTEMI (non-ST elevated myocardial infarction) ([place]) 3. Active Problems: 4.   CAP (community acquired pneumonia) 20.   COPD (chronic obstructive pulmonary disease) (HCC) 6.   Lung nodule 7.   Essential hypertension 8.   Bradycardia 9.   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   Dyslipidemia, goal LDL below 70 12.    Altered mental status 13.   Left arm swelling 14.   Acute delirium 15.   Encephalopathy acute 16.   Time Spent Directly with Patient:  20 minutes  Length of Stay:  LOS: 6 days   POD # 6 inf STEMI Rx with DES X2. Occluded mid PDA secondary to thromboembolism. Peak trop about 8. EKG without acute changed (IRBBB). Major issue has been mental status which has progressively improved. Apprec Neuro eval. They have signed off. Labs OK. Exam benign. PT working with him but he remains weak which will delay D/C. I have suggested Tx to a rehab facility in [place] like the [facility] as a bridge to returning home. The patient and daughter are in agreement. Will get CM/SW involved.  Quay Burow 01/07/2016, 11:39 AM

## 2016-01-07 NOTE — Plan of Care (Addendum)
Problem: Phase I Progression Outcomes Goal: Voiding-avoid urinary catheter unless indicated Outcome: Not Progressing   Problem: Phase II Progression Outcomes Goal: Hemodynamically stable Outcome: Not Progressing Monitor b/p closely with prn hydralazine for systolic b/p greater than 160.

## 2016-01-07 NOTE — Progress Notes (Signed)
PROGRESS NOTE  Lee Chang AVW:[id number] DOB: 1941/02/09 DOA: 12/31/2015 PCP: [name], MD  Assessment/Plan: Subjective; More sleepy today per daughter.  He is alert. 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 Delirium,.Confusion, tremors.  CT head no acute findings, EEG nonspecific likely multifactorial, including delirium, dehydration, metabolic abnormalities, fever.  Neurology following, exelon stopped on 3/13 (per daughter patient was started on exelon in 11/2015), neurology started seroquel on 3/13will defer neurology for meds adjustment. Improving.   Hematuria:  UA with no significant WBC. Suspect traumatic in setting of blood thinner.  Monitor hb.  HB stable.   Hypernatremia; change IV fluids to D 5.  Repeat labs in am.   Fever, Leukocytosis. Presume secondary to PNA Fever trending down. WBC trending down.   Change to Zosyn 3-15  Repeat Chest x ray/ no acute finding.  [place] negative for cholecystitis.   AKI;  Hold cozaar, continue with IV fluids.  Might be multifactorial. Mild hypernatremia, change fluids to half NS>    Mildly elevated ammonia level, started on lactulose.  Ammonia level normalized.   COPD exacerbation/CAP: continue with nebulizer, repeat another dose of solumedrol.   CAD/HTN, NSTEMI S/p R cardiac catheterization due to thromboemboli mid-distal rPDA (Pt had 18 hrs Aggrastat IV) with infero- apical wall motion abnormality. 2 D Echo Nl LVF, EF of 60-65% EKG w/o  acute changes.  Plan per cardiology  Left lung nodule Ct chest 3/11 showed Lobular right apical pulmonary nodule measures 2.2 x 1.5 x 1.3 cm with minimal spiculation, concerning for primary bronchogenic neoplasm. There is no adenopathy. Other than a subcentimeter lucency in T4 which is nonspecific, no evidence of metastatic disease For PET as OP.   Left arm swelling with ecchymoses. These findings are new, could be due to recent anticoagulation during cath LUE [place] no DVT   Dementia Per family patient was started on aricept, then exelon this year Diet modified per  swallow evaluation, on aspiration precaution   Code Status: DNR, confirmed with daughter who is in room  Family Communication: patient and daughter in room  Disposition Plan: per cardiology (primary team)    Consultants:  Cardiology primary  [facility] consultant for altered mental status, leukocytosis  Neurology consultant for confusion  Procedures:  Cardiac cath on 3/11  EEG   Antibiotics:  Rocephin/zithro from admission---change to Zosyn 3-15   Objective: BP 182/65 mmHg  Pulse 61  Temp(Src) 97.4 F (36.3 C) (Oral)  Resp 18  Ht  (1.727 m)  Wt 91.944 kg (202 lb 11.2 oz)  BMI 30.83 kg/m2  SpO2 98%  Intake/Output Summary (Last 24 hours) at 01/07/16 1737 Last data filed at 01/07/16 1350  Gross per 24 hour  Intake 1532.91 ml  Output   1775 ml  Net -242.09 ml   Filed Weights   01/05/16 0428 01/06/16 0600 01/07/16 0409  Weight: 89.1 kg (196 lb 6.9 oz) 87.816 kg (193 lb 9.6 oz) 91.944 kg (202 lb 11.2 oz)    Exam:  General:  Lethargic, answer some questions.   Cardiovascular: RRR  Respiratory: Bilateral Wheezing.   Abdomen: Soft/ND/NT, positive BS  Musculoskeletal: No Edema  Neuro:  Lethargic, answer some questions. Mild tremors,   Data Reviewed: Basic Metabolic Panel:  Recent Labs Lab 01/02/16 0806 01/03/16 0049 01/05/16 0853 01/06/16 0459 01/07/16 0530  NA 140 138 144 147*  151*  K 3.6 3.8 3.9 3.9 3.8  CL 106 107 111 115* 117*  CO2 25 20* 21* 23 27  GLUCOSE 115* 167* 140* 126* 127*  BUN 18 13 39* 41* 30*  CREATININE 1.18 1.14 1.98* 1.55* 1.33*  CALCIUM 9.0 9.5 9.3 9.2 9.5   Liver Function Tests:  Recent Labs Lab 01/01/16 0449 01/02/16 0806 01/03/16 0049 01/05/16 0853 01/07/16 0530  AST 15 37 58* 26 15  ALT 14* 16* 24 18 15*  ALKPHOS 56 50 59 49 40  BILITOT 0.7 0.3 1.4* 0.9 0.7  PROT 6.1* 5.2* 6.3* 5.9* 5.3*  ALBUMIN 3.5 3.2* 3.5 3.2* 2.7*   No results for input(s): LIPASE, AMYLASE in the last 168 hours.  Recent Labs Lab 01/03/16 1215 01/05/16 0320  AMMONIA 39* 35   CBC:  Recent Labs Lab 12/31/15 2227  01/03/16 0352 01/03/16 1220 01/04/16 0254 01/05/16 0320 01/06/16 0459 01/06/16 1843 01/07/16 0530  WBC 13.7*  < > 16.6*  --  17.5* 17.5* 14.0*  --  11.7*  NEUTROABS 10.3*  --   --  14.2*  --   --  10.8*  --   --   HGB 16.0  < > 14.8  --  14.5 13.9 11.9* 12.4* 12.0*  HCT 47.0  < > 44.8  --  44.2 42.8 37.6* 39.7 38.9*  MCV 89.5  < > 88.4  --  89.3 89.9 91.7  --  92.8  PLT 216  < > 226  --  207 207 179  --  199  < > = values in this interval not displayed. Cardiac Enzymes:    Recent Labs Lab 01/01/16 0857 01/01/16 1429 01/01/16 1840 01/02/16 0155 01/02/16 0806  TROPONINI 2.79* 3.19* 2.29* 1.15* 7.83*   BNP (last 3 results)  Recent Labs  01/01/16 1840  BNP 36.1    ProBNP (last 3 results) No results for input(s): PROBNP in the last 8760 hours.  CBG:  Recent Labs Lab 01/03/16 0841 01/04/16 0755 01/05/16 0826 01/06/16 0745 01/07/16 0408  GLUCAP 134* 126* 123* 88 160*    Recent Results (from the past 240 hour(s))  Blood culture (routine x 2)     Status: None   Collection Time: 01/01/16  1:14 AM  Result Value Ref Range Status   Specimen Description BLOOD RIGHT ANTECUBITAL DRAWN BY RN  Final   Special Requests BOTTLES DRAWN AEROBIC AND ANAEROBIC 6CC  Final   Culture NO GROWTH 5 DAYS  Final   Report Status  01/06/2016 FINAL  Final  Blood culture (routine x 2)     Status: None   Collection Time: 01/01/16  1:25 AM  Result Value Ref Range Status   Specimen Description BLOOD LEFT ANTECUBITAL  Final   Special Requests BOTTLES DRAWN AEROBIC ONLY 6CC  Final   Culture NO GROWTH 5 DAYS  Final   Report Status 01/06/2016 FINAL  Final  MRSA PCR Screening     Status: None   Collection Time: 01/01/16  2:45 AM  Result Value Ref Range Status   MRSA by PCR NEGATIVE NEGATIVE Final    Comment:        The  GeneXpert MRSA Assay (FDA approved for NASAL specimens only), is one component of a comprehensive MRSA colonization surveillance program. It is not intended to diagnose MRSA infection nor to guide or monitor treatment for MRSA infections.   Culture, blood (routine x 2)     Status: None (Preliminary result)   Collection Time: 01/03/16 12:38 AM  Result Value Ref Range Status   Specimen Description BLOOD RIGHT ARM  Final   Special Requests IN PEDIATRIC BOTTLE 3CC  Final   Culture NO GROWTH 4 DAYS  Final   Report Status PENDING  Incomplete  Culture, blood (routine x 2)     Status: None (Preliminary result)   Collection Time: 01/03/16 12:42 AM  Result Value Ref Range Status   Specimen Description BLOOD RIGHT ARM  Final   Special Requests IN PEDIATRIC BOTTLE 3CC  Final   Culture NO GROWTH 4 DAYS  Final   Report Status PENDING  Incomplete  Culture, Urine     Status: None   Collection Time: 01/05/16 10:25 PM  Result Value Ref Range Status   Specimen Description URINE, CATHETERIZED  Final   Special Requests NONE  Final   Culture NO GROWTH 1 DAY  Final   Report Status 01/07/2016 FINAL  Final     Studies: No results found.  Scheduled Meds: . aspirin EC  81 mg Oral Daily  . atorvastatin  80 mg Oral q1800  . azithromycin  500 mg Intravenous Q24H  . finasteride  5 mg Oral Daily  . guaiFENesin  600 mg Oral BID  . ipratropium-albuterol  3 mL Nebulization TID  . metoprolol tartrate  12.5 mg Oral BID   . piperacillin-tazobactam (ZOSYN)  IV  3.375 g Intravenous 3 times per day  . polyethylene glycol  17 g Oral BID  . QUEtiapine  25 mg Oral q1800  . senna  1 tablet Oral Daily  . sodium chloride flush  3 mL Intravenous Q12H  . sodium chloride flush  3 mL Intravenous Q12H  . tamsulosin  0.8 mg Oral Daily  . ticagrelor  90 mg Oral BID    Continuous Infusions: . dextrose 100 mL/hr at 01/07/16 1509     Time spent: 35mins  Alba Coryegalado, Kevyn Wengert A MD [facility] Pager [phone] 7PM-7AM, please contact night-coverage at [email or web address], password [email or web address] 01/07/2016, 5:37 PM  LOS: 6 days

## 2016-01-07 NOTE — Care Management Important Message (Signed)
Important Message  Patient Details  Name: Lee Chang MRN: [id number] Date of Birth: 16-Jul-1941   Medicare Important Message Given:  Yes    [name] [name] 01/07/2016, 2:51 PM

## 2016-01-07 NOTE — Consult Note (Signed)
   [facility]   01/07/2016  Lee Chang [birth year] [id number]   [facility]. Reviewed in EPIC notes that patient's disposition is likely for SNF. Therefore, will not engage [facility] at this time. If disposition plans change to home, [facility]. Discussed with inpatient RNCM. For further questions, please contact:  Raiford Nobletika Hall, MSN-Ed, RN,BSN [facility] Hospital Liaison [phone]

## 2016-01-07 NOTE — Progress Notes (Signed)
Taught patient and daughter chin tuck procedure for patients with swallowing difficulty and patient followed instructions well, checked throat and lungs after he was finished and then cleaned out his mouth, will continue to monitor.

## 2016-01-07 NOTE — Progress Notes (Signed)
Speech Language Pathology Treatment: Dysphagia  Patient Details Name: Sonda Rumblelmer W Enterline MRN: [id number] DOB: 12-02-1940 Today's Date: 01/07/2016 Time: [id number] SLP Time Calculation (min) (ACUTE ONLY): 20 min  Assessment / Plan / Recommendation Clinical Impression  Patient seen to assess toleration of upgraded solid and liquid consistencies as reports indicate that his alertness and mentation have improved significantly. Patient seen at bedside with daughter present. Patient exhibited delayed and immediate throat clear and cough with thin liquids via straw sips, however he tolerated thin liquids with small cup sips without immediate throat clear or cough. Of note: patient has been coughing up secretions which then have to be suctioned out. During this session, he did have a few instances of coughing, followed by needing suctioning to remove clear-white with slight red tinge secretions from back of oral cavity. Patient had difficulty with mastication and oral manipulation/transit of hard solid graham cracker and had to spit some out, "it's like glue".    HPI HPI: 75 y.o. male with a Past Medical History of peptic ulcer disease, COPD, and NSTEMI, right lung nodule, HLD, dementia who presented on 01/01/2015 with chest pain and pneumonia. Patient underwent cardiac catheterization with placement of DES on 01/02/2016.       SLP Plan  Continue with current plan of care     Recommendations  Diet recommendations: Thin liquid;Dysphagia 3 (mechanical soft) Liquids provided via: Cup;No straw Medication Administration: Whole meds with puree Supervision: Staff to assist with self feeding;Full supervision/cueing for compensatory strategies Compensations: Minimize environmental distractions;Small sips/bites;Slow rate Postural Changes and/or Swallow Maneuvers: Seated upright 90 degrees             Oral Care Recommendations: Oral care BID Follow up Recommendations: Other (comment) (TBD) Plan: Continue with  current plan of care     GO                Pablo Lawrencereston, Ladean Steinmeyer Tarrell 01/07/2016, 4:58 PM   Angela NevinJohn T. Isaah Furry, MA, CCC-SLP 01/07/2016 5:02 PM

## 2016-01-08 LAB — BASIC METABOLIC PANEL
Anion gap: 12 (ref 5–15)
BUN: 20 mg/dL (ref 6–20)
CHLORIDE: 114 mmol/L — AB (ref 101–111)
CO2: 24 mmol/L (ref 22–32)
CREATININE: 1.2 mg/dL (ref 0.61–1.24)
Calcium: 9.7 mg/dL (ref 8.9–10.3)
GFR calc non Af Amer: 57 mL/min — ABNORMAL LOW (ref >60)
Glucose, Bld: 126 mg/dL — ABNORMAL HIGH (ref 65–99)
Potassium: 3.3 mmol/L — ABNORMAL LOW (ref 3.5–5.1)
Sodium: 150 mmol/L — ABNORMAL HIGH (ref 135–145)

## 2016-01-08 LAB — HEMOGLOBIN AND HEMATOCRIT, BLOOD
HCT: 42.7 % (ref 39.0–52.0)
HEMOGLOBIN: 13.4 g/dL (ref 13.0–17.0)

## 2016-01-08 LAB — CULTURE, BLOOD (ROUTINE X 2)
CULTURE: NO GROWTH
Culture: NO GROWTH

## 2016-01-08 LAB — GLUCOSE, CAPILLARY
GLUCOSE-CAPILLARY: 89 mg/dL (ref 65–99)
Glucose-Capillary: 134 mg/dL — ABNORMAL HIGH (ref 65–99)

## 2016-01-08 MED ORDER — ENSURE ENLIVE PO LIQD
237.0000 mL | Freq: Two times a day (BID) | ORAL | Status: DC
  Administered 2016-01-08 – 2016-01-11 (×3): 237 mL via ORAL

## 2016-01-08 MED ORDER — IPRATROPIUM-ALBUTEROL 0.5-2.5 (3) MG/3ML IN SOLN
3.0000 mL | Freq: Four times a day (QID) | RESPIRATORY_TRACT | Status: DC
Start: 2016-01-08 — End: 2016-01-08
  Administered 2016-01-08 (×2): 3 mL via RESPIRATORY_TRACT
  Filled 2016-01-08 (×2): qty 3

## 2016-01-08 MED ORDER — LOSARTAN POTASSIUM 50 MG PO TABS
50.0000 mg | ORAL_TABLET | Freq: Every day | ORAL | Status: DC
  Administered 2016-01-08: 50 mg via ORAL
  Filled 2016-01-08: qty 1

## 2016-01-08 MED ORDER — POTASSIUM CHLORIDE CRYS ER 20 MEQ PO TBCR
40.0000 meq | EXTENDED_RELEASE_TABLET | Freq: Once | ORAL | Status: AC
  Administered 2016-01-08: 40 meq via ORAL
  Filled 2016-01-08: qty 2

## 2016-01-08 MED ORDER — METHYLPREDNISOLONE SODIUM SUCC 40 MG IJ SOLR
40.0000 mg | Freq: Once | INTRAMUSCULAR | Status: AC
Start: 2016-01-08 — End: 2016-01-08
  Administered 2016-01-08: 40 mg via INTRAVENOUS
  Filled 2016-01-08: qty 1

## 2016-01-08 MED ORDER — POTASSIUM CHLORIDE 10 MEQ/100ML IV SOLN
INTRAVENOUS | Status: AC
  Administered 2016-01-08: 10 meq
  Filled 2016-01-08: qty 100

## 2016-01-08 MED ORDER — PREDNISONE 20 MG PO TABS
40.0000 mg | ORAL_TABLET | Freq: Every day | ORAL | Status: DC
  Administered 2016-01-08: 40 mg via ORAL
  Filled 2016-01-08: qty 2

## 2016-01-08 MED ORDER — IPRATROPIUM-ALBUTEROL 0.5-2.5 (3) MG/3ML IN SOLN: 3.0000 mL | RESPIRATORY_TRACT | Status: DC | PRN

## 2016-01-08 MED ORDER — POTASSIUM CHLORIDE 10 MEQ/100ML IV SOLN
10.0000 meq | INTRAVENOUS | Status: AC
  Administered 2016-01-08: 10 meq via INTRAVENOUS
  Filled 2016-01-08: qty 100

## 2016-01-08 MED ORDER — HEPARIN SODIUM (PORCINE) 5000 UNIT/ML IJ SOLN: 5000.0000 [IU] | Freq: Three times a day (TID) | INTRAMUSCULAR | Status: DC

## 2016-01-08 NOTE — Evaluation (Signed)
Clinical/Bedside Swallow Evaluation Patient Details  Name: Lee Chang MRN: [id number] Date of Birth: 01/07/1941  Today's Date: 01/08/2016 Time: SLP Start Time (ACUTE ONLY): 1605 SLP Stop Time (ACUTE ONLY): 1625 SLP Time Calculation (min) (ACUTE ONLY): 20 min  Past Medical History:  Past Medical History  Diagnosis Date  . Ulcer   . Asthma   . Essential hypertension   . NSTEMI (non-ST elevated myocardial infarction) (HCC) 12/31/2015    Echo 01/01/16 (pre-CATH-PCI): EF 60-65%, no RWMA.   . 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 Moderate OM1 35% & midLAD 45%  . 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 Dyslipidemia, goal LDL below 70 01/02/2016  . COPD (chronic obstructive pulmonary disease) (HCC) 01/01/2016  . Lung nodule 01/01/2016  . Dementia    Past Surgical History:  Past Surgical History  Procedure Laterality Date  . Hernia repair    . Cardiac catheterization N/A 01/01/2016    Procedure: Left Heart Cath and Coronary Angiography;  Surgeon: Marykay Lexavid W Harding, MD;  [facility];  Service: Cardiovascular;  Laterality: N/A;  . Cardiac catheterization Right 01/01/2016    Procedure: Coronary Stent Intervention;  Surgeon: Marykay Lexavid W Harding, MD;  [facility];  Service: Cardiovascular;  Laterality: Right;   HPI:  75 y.o. male with a Past Medical History of peptic ulcer disease, COPD, and NSTEMI, right lung nodule, HLD, dementia who presented on 01/01/2015 with chest pain and pneumonia. Patient underwent cardiac catheterization with placement of DES on 01/02/2016.    Assessment / Plan / Recommendation Clinical Impression  Pt continues to demonstrate a cognitive-based dysphagia, improved from  previous bedside swallow eval on 3/13. Pt demonstrated a delayed throat clear following 50% of trials of thin liquids; suspect a mildly delayed swallow initiation. No difficulties noted with puree or dysphagia 3 consistency (mixed fruit). Pt is oriented x2 (self/ location) and had occasional decreased attention to PO trials as several people were in and out of the room during the evaluation. Given these findings and current cognitive status, recommend downgrading liquids to nectar-thick, continue dysphagia 3, full supervision- ensure pt has swallowed before providing additional bites, allow to self-feed as much as possible, cue small bites/ sips. RN reported pt not swallowing meds this a.m.- recommend crushing in puree. Will continue to follow for diet tolerance/ consider advancement or need for objective evaluation.    Aspiration Risk  Mild aspiration risk;Moderate aspiration risk    Diet Recommendation Dysphagia 3 (Mech soft);Nectar-thick liquid   Liquid Administration via: Cup;No straw Medication Administration: Crushed with puree Supervision: Patient able to self feed;Staff to assist with self feeding;Full supervision/cueing for compensatory strategies Compensations: Minimize environmental distractions;Small sips/bites;Slow rate Postural Changes: Seated upright at 90 degrees;Remain upright for at least 30 minutes after po intake    Other  Recommendations Oral Care Recommendations: Oral care BID Other Recommendations: Order thickener from pharmacy;Remove water pitcher;Have oral suction available;Clarify dietary restrictions   Follow up Recommendations  Other (comment) (TBD)    Frequency and Duration min 2x/week  2 weeks       Prognosis Prognosis for Safe Diet Advancement: Good Barriers to Reach Goals: Cognitive deficits      Swallow Study  General HPI: 75 y.o. male with a Past Medical History of peptic ulcer disease, COPD, and NSTEMI, right lung nodule, HLD, dementia who presented on  01/01/2015 with chest pain and pneumonia. Patient underwent cardiac catheterization with placement of DES on 01/02/2016.  Type of Study: Bedside Swallow Evaluation Diet Prior to this Study: Dysphagia 3 (soft);Thin liquids Temperature Spikes Noted:  (low grade) Respiratory Status: Room air History of Recent Intubation: No Behavior/Cognition: Alert;Cooperative;Confused;Requires cueing Oral Cavity Assessment: Within Functional Limits Oral Care Completed by SLP: No Oral Cavity - Dentition: Dentures, top;Dentures, bottom Vision: Functional for self-feeding Self-Feeding Abilities: Able to feed self;Needs assist Patient Positioning: Upright in bed Baseline Vocal Quality: Normal Volitional Cough: Strong Volitional Swallow: Able to elicit    Oral/Motor/Sensory Function Overall Oral Motor/Sensory Function: Within functional limits   Ice Chips Ice chips: Not tested   Thin Liquid Thin Liquid: Impaired Presentation: Cup;Self Fed Pharyngeal  Phase Impairments: Suspected delayed Swallow;Throat Clearing - Delayed    Nectar Thick Nectar Thick Liquid: Not tested   Honey Thick Honey Thick Liquid: Not tested   Puree Puree: Within functional limits Presentation: Self Fed;Spoon   Solid   GO   Solid: Within functional limits        Metro Kung, MA, CCC-SLP 01/08/2016,4:33 PM [phone]

## 2016-01-08 NOTE — Progress Notes (Signed)
Referral received to assist with OP rehab facility early next week in [place].  Met with pt and son Marshell Levan). Pt lives alone. Per son, the d/c plan is for pt to go to a facility for rehab. PT is recommending SNF. His sister Langley Gauss) who is [facility] POA prefers [facility] in [place]. Denise's cell phone # [phone]. Spoke to [place], [facility] and discussed d/c plan to [place].

## 2016-01-08 NOTE — Progress Notes (Addendum)
Initial Nutrition Assessment  INTERVENTION:  Food /liquid texture modification per ST  Magic cup TID with meals, each supplement provides 290 kcal and 9 grams of protein   Encourage meal intake and allow ample time for pt to eat before removing tray  NUTRITION DIAGNOSIS:   Inadequate oral intake related to swallow difficulty as evidenced by ST evaluation and recent meal intake <25%.   GOAL:  Pt to meet >/= 90% of their estimated nutrition needs      MONITOR: Po intake, labs and wt trends      REASON FOR ASSESSMENT:   Malnutrition Screening Tool    ASSESSMENT:   COPD, and NSTEMI, right lung nodule, HLD, dementia -D3 diet liquids downgraded today per ST eval- very poor po intake per son ever since his procedure 3/11.   There is no significant change in weight but pt is high risk for malnutrition and weight loss due to his swallow difficulty. ST changed liquids to nectar today. Pt has tried [employer] Enlive but no tolerating it well due to how it "felt in his throat".  NFPE-no acute findings-of muscle of fat mass loss. Edema mild to upper and lower extremities.  Labs:Sodium 150, potassium 3.3, glucose 134.  Diet Order:  DIET DYS 3 Room service appropriate?: Yes; Fluid consistency:: Nectar Thick  Skin:   intact  Last BM:   3/18 watery stool - son says pt is constipated-no BM for several days-  Height:   Ht Readings from Last 1 Encounters:  01/01/16 5\' 8"  (1.727 m)    Weight:   Wt Readings from Last 1 Encounters:  01/08/16 189 lb 9.6 oz (86.002 kg)    Ideal Body Weight:  70 kg  BMI:  Body mass index is 28.84 kg/(m^2).  Estimated Nutritional Needs:   Kcal:  2000-2200  Protein:  103-110 gr  Fluid:  2.0 liters daily  EDUCATION NEEDS:     Royann ShiversLynn Adonia Porada MS,RD,CSG,LDN Office: [phone] Pager: [phone]

## 2016-01-08 NOTE — Progress Notes (Signed)
    Subjective:  Denies CP or dyspnea; alert and oriented   Objective:  Filed Vitals:   01/08/16 0325 01/08/16 0515 01/08/16 0623 01/08/16 0753  BP: 182/77 186/68 163/69 168/55  Pulse: 62   69  Temp: 99.9 F (37.7 C)   99.1 F (37.3 C)  TempSrc: Oral   Oral  Resp: 10   15  Height:      Weight: 189 lb 9.6 oz (86.002 kg)     SpO2: 94%   94%    Intake/Output from previous day:  Intake/Output Summary (Last 24 hours) at 01/08/16 1032 Last data filed at 01/08/16 [id number]  Gross per 24 hour  Intake   1045 ml  Output   1600 ml  Net   -555 ml    Physical Exam: Physical exam: Well-developed well-nourished in no acute distress.  Skin is warm and dry.  HEENT is normal.  Neck is supple.  Chest with mild expiratory wheeze Cardiovascular exam is regular rate and rhythm.  Abdominal exam nontender or distended. No masses palpated. Extremities show no edema. neuro grossly intact    Lab Results: Basic Metabolic Panel:  Recent Labs  98/08/9102/17/17 0530 01/08/16 0602  NA 151* 150*  K 3.8 3.3*  CL 117* 114*  CO2 27 24  GLUCOSE 127* 126*  BUN 30* 20  CREATININE 1.33* 1.20  CALCIUM 9.5 9.7   CBC:  Recent Labs  01/06/16 0459 01/06/16 1843 01/07/16 0530  WBC 14.0*  --  11.7*  NEUTROABS 10.8*  --   --   HGB 11.9* 12.4* 12.0*  HCT 37.6* 39.7 38.9*  MCV 91.7  --  92.8  PLT 179  --  199     Assessment/Plan:  1 status post myocardial infarction-patient has had PCI of his right coronary artery. Continue aspirin, brilinta and statin. Continue metoprolol. 2 Hypernatremia-continue D5W. 3 acute kidney disease-renal function is improving with hydration. 4 lung nodule patient will need an outpatient PET scan. 5 delirium-improving. 6 Hypertension-Blood pressure elevated. Had Cozaar 50 mg daily and follow.  We are now arranging Transfer to a rehabilitation facility prior to returning home.   Olga MillersBrian Terrick Allred 01/08/2016, 10:32 AM

## 2016-01-08 NOTE — Progress Notes (Signed)
SLP Cancellation Note  Patient Details Name: Sonda Rumblelmer W Knepp MRN: [id number] DOB: 1941-06-02   Cancelled treatment:       Reason Eval/Treat Not Completed: Other (comment). Began bedside swallow eval; pt had to use bathroom 2 times within ~5 minutes. Will reattempt later this pm as time allows.   Rebecca EatonOleksiak, Demetre Monaco K 01/08/2016, 3:13 PM

## 2016-01-08 NOTE — Progress Notes (Signed)
PROGRESS NOTE  Lee Chang ZOX:[id number] DOB: 04-12-1941 DOA: 12/31/2015 PCP: [name], MD  Assessment/Plan: Subjective; He is alert, denies worsening dyspnea. Hd BM.  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 Delirium,.Confusion, tremors.  CT head no acute findings, EEG nonspecific likely multifactorial, including delirium, dehydration, metabolic abnormalities, fever.  Neurology following, exelon stopped on 3/13 (per daughter patient was started on exelon in 11/2015), neurology started seroquel on 3/13will defer neurology for meds adjustment. Improving.   Hematuria:  UA with no significant WBC. Suspect traumatic in setting of blood thinner.  Monitor hb.  HB stable.  Will discontinue heparin.   Hypernatremia; continue with IV fluids to D 5.  Repeat labs in am.   Fever, Leukocytosis. Presume secondary to PNA Fever trending down. WBC trending down.   Change to Zosyn 3-15  Repeat Chest x ray/ no acute finding.  [place] negative for cholecystitis.   AKI;  Hold cozaar, continue with IV fluids.  Might be multifactorial.    Mildly elevated ammonia level, started on lactulose.  Ammonia level normalized.   COPD exacerbation/CAP: continue with nebulizer, repeat another dose of solumedrol.   CAD/HTN, NSTEMI S/p R cardiac catheterization due to thromboemboli mid-distal rPDA (Pt had 18 hrs Aggrastat IV) with infero- apical wall motion abnormality. 2 D Echo Nl LVF, EF of 60-65% EKG  w/o acute changes.  Plan per cardiology  Left lung nodule Ct chest 3/11 showed Lobular right apical pulmonary nodule measures 2.2 x 1.5 x 1.3 cm with minimal spiculation, concerning for primary bronchogenic neoplasm. There is no adenopathy. Other than a subcentimeter lucency in T4 which is nonspecific, no evidence of metastatic disease For PET as OP.   Left arm swelling with ecchymoses. These findings are new, could be due to recent anticoagulation during cath LUE [place] no DVT   Dementia Per family patient was started on aricept, then exelon this year Diet modified per  swallow evaluation, on aspiration precaution   Code Status: DNR,   Family Communication: patient and son who was in the room.   Disposition Plan: per cardiology (primary team)    Consultants:  Cardiology primary  [facility] consultant for altered mental status, leukocytosis  Neurology consultant for confusion  Procedures:  Cardiac cath on 3/11  EEG   Antibiotics:  Rocephin/zithro from admission---change to Zosyn 3-15   Objective: BP 168/74 mmHg  Pulse 69  Temp(Src) 99.2 F (37.3 C) (Oral)  Resp 16  Ht  (1.727 m)  Wt 86.002 kg (189 lb 9.6 oz)  BMI 28.84 kg/m2  SpO2 92%  Intake/Output Summary (Last 24 hours) at 01/08/16 1400 Last data filed at 01/08/16 0635  Gross per 24 hour  Intake    925 ml  Output   1350 ml  Net   -425 ml   Filed Weights   01/06/16 0600 01/07/16 0409 01/08/16 0325  Weight: 87.816 kg (193 lb 9.6 oz) 91.944 kg (202 lb 11.2 oz) 86.002 kg (189 lb 9.6 oz)  Exam:   General:  Alert in no distress  Cardiovascular: RRR  Respiratory: Bilateral Wheezing.   Abdomen: Soft/ND/NT, positive BS  Musculoskeletal: No Edema  Neuro:  Lethargic, answer some questions. Mild tremors,   Data Reviewed: Basic Metabolic Panel:  Recent Labs Lab 01/03/16 0049 01/05/16 0853 01/06/16 0459 01/07/16 0530 01/08/16 0602  NA 138 144 147* 151* 150*  K 3.8 3.9 3.9 3.8 3.3*  CL 107  111 115* 117* 114*  CO2 20* 21* 23 27 24   GLUCOSE 167* 140* 126* 127* 126*  BUN 13 39* 41* 30* 20  CREATININE 1.14 1.98* 1.55* 1.33* 1.20  CALCIUM 9.5 9.3 9.2 9.5 9.7   Liver Function Tests:  Recent Labs Lab 01/02/16 0806 01/03/16 0049 01/05/16 0853 01/07/16 0530  AST 37 58* 26 15  ALT 16* 24 18 15*  ALKPHOS 50 59 49 40  BILITOT 0.3 1.4* 0.9 0.7  PROT 5.2* 6.3* 5.9* 5.3*  ALBUMIN 3.2* 3.5 3.2* 2.7*   No results for input(s): LIPASE, AMYLASE in the last 168 hours.  Recent Labs Lab 01/03/16 1215 01/05/16 0320  AMMONIA 39* 35   CBC:  Recent Labs Lab 01/03/16 0352 01/03/16 1220 01/04/16 0254 01/05/16 0320 01/06/16 0459 01/06/16 1843 01/07/16 0530  WBC 16.6*  --  17.5* 17.5* 14.0*  --  11.7*  NEUTROABS  --  14.2*  --   --  10.8*  --   --   HGB 14.8  --  14.5 13.9 11.9* 12.4* 12.0*  HCT 44.8  --  44.2 42.8 37.6* 39.7 38.9*  MCV 88.4  --  89.3 89.9 91.7  --  92.8  PLT 226  --  207 207 179  --  199   Cardiac Enzymes:    Recent Labs Lab 01/01/16 1429 01/01/16 1840 01/02/16 0155 01/02/16 0806  TROPONINI 3.19* 2.29* 1.15* 7.83*   BNP (last 3 results)  Recent Labs  01/01/16 1840  BNP 36.1    ProBNP (last 3 results) No results for input(s): PROBNP in the last 8760 hours.  CBG:  Recent Labs Lab 01/04/16 0755 01/05/16 0826 01/06/16 0745 01/07/16 0408 01/08/16 0757  GLUCAP 126* 123* 88 160* 134*    Recent Results (from the past 240 hour(s))  Blood culture (routine x 2)     Status: None   Collection Time: 01/01/16  1:14 AM  Result Value Ref Range Status   Specimen Description BLOOD RIGHT ANTECUBITAL DRAWN BY RN  Final   Special Requests BOTTLES DRAWN AEROBIC AND ANAEROBIC 6CC  Final   Culture NO GROWTH 5 DAYS  Final   Report Status 01/06/2016 FINAL  Final  Blood culture (routine x 2)     Status: None   Collection Time: 01/01/16  1:25 AM  Result Value Ref Range Status   Specimen Description BLOOD LEFT ANTECUBITAL  Final   Special Requests  BOTTLES DRAWN AEROBIC ONLY 6CC  Final   Culture NO GROWTH 5 DAYS  Final   Report Status 01/06/2016 FINAL  Final  MRSA PCR Screening     Status: None   Collection Time: 01/01/16  2:45 AM  Result Value Ref Range Status   MRSA by PCR NEGATIVE NEGATIVE Final    Comment:        The GeneXpert MRSA Assay (FDA approved for NASAL specimens only), is one component of a comprehensive MRSA colonization surveillance program. It is not intended to diagnose MRSA infection nor to guide or monitor treatment for MRSA infections.   Culture, blood (routine x 2)  Status: None (Preliminary result)   Collection Time: 01/03/16 12:38 AM  Result Value Ref Range Status   Specimen Description BLOOD RIGHT ARM  Final   Special Requests IN PEDIATRIC BOTTLE 3CC  Final   Culture NO GROWTH 4 DAYS  Final   Report Status PENDING  Incomplete  Culture, blood (routine x 2)     Status: None (Preliminary result)   Collection Time: 01/03/16 12:42 AM  Result Value Ref Range Status   Specimen Description BLOOD RIGHT ARM  Final   Special Requests IN PEDIATRIC BOTTLE 3CC  Final   Culture NO GROWTH 4 DAYS  Final   Report Status PENDING  Incomplete  Culture, Urine     Status: None   Collection Time: 01/05/16 10:25 PM  Result Value Ref Range Status   Specimen Description URINE, CATHETERIZED  Final   Special Requests NONE  Final   Culture NO GROWTH 1 DAY  Final   Report Status 01/07/2016 FINAL  Final     Studies: No results found.  Scheduled Meds: . aspirin EC  81 mg Oral Daily  . atorvastatin  80 mg Oral q1800  . feeding supplement (ENSURE ENLIVE)  237 mL Oral BID BM  . finasteride  5 mg Oral Daily  . guaiFENesin  600 mg Oral BID  . ipratropium-albuterol  3 mL Nebulization Q6H  . losartan  50 mg Oral Daily  . methylPREDNISolone (SOLU-MEDROL) injection  40 mg Intravenous Once  . metoprolol tartrate  12.5 mg Oral BID  . piperacillin-tazobactam (ZOSYN)  IV  3.375 g Intravenous 3 times per day  . polyethylene  glycol  17 g Oral BID  . potassium chloride  10 mEq Intravenous Q1 Hr x 2  . QUEtiapine  25 mg Oral q1800  . senna  1 tablet Oral Daily  . sodium chloride flush  3 mL Intravenous Q12H  . sodium chloride flush  3 mL Intravenous Q12H  . tamsulosin  0.8 mg Oral Daily  . ticagrelor  90 mg Oral BID    Continuous Infusions: . dextrose 100 mL (01/08/16 0509)     Time spent:  Hartley Barefoot A MD [facility] Pager [phone] 7PM-7AM, please contact night-coverage at [email or web address], password [facility] 01/08/2016, 2:00 PM  LOS: 7 days

## 2016-01-09 LAB — BASIC METABOLIC PANEL
Anion gap: 14 (ref 5–15)
BUN: 17 mg/dL (ref 6–20)
CALCIUM: 9.2 mg/dL (ref 8.9–10.3)
CHLORIDE: 105 mmol/L (ref 101–111)
CO2: 23 mmol/L (ref 22–32)
CREATININE: 1.03 mg/dL (ref 0.61–1.24)
Glucose, Bld: 142 mg/dL — ABNORMAL HIGH (ref 65–99)
Potassium: 3.2 mmol/L — ABNORMAL LOW (ref 3.5–5.1)
SODIUM: 142 mmol/L (ref 135–145)

## 2016-01-09 LAB — GLUCOSE, CAPILLARY: GLUCOSE-CAPILLARY: 141 mg/dL — AB (ref 65–99)

## 2016-01-09 MED ORDER — POTASSIUM CHLORIDE CRYS ER 20 MEQ PO TBCR
40.0000 meq | EXTENDED_RELEASE_TABLET | Freq: Once | ORAL | Status: AC
  Administered 2016-01-09: 40 meq via ORAL
  Filled 2016-01-09: qty 2

## 2016-01-09 MED ORDER — ALUM & MAG HYDROXIDE-SIMETH 200-200-20 MG/5ML PO SUSP
30.0000 mL | Freq: Four times a day (QID) | ORAL | Status: DC | PRN
  Administered 2016-01-09: 30 mL via ORAL
  Filled 2016-01-09: qty 30

## 2016-01-09 MED ORDER — LOSARTAN POTASSIUM 50 MG PO TABS
100.0000 mg | ORAL_TABLET | Freq: Every day | ORAL | Status: DC
  Administered 2016-01-09 – 2016-01-11 (×3): 100 mg via ORAL
  Filled 2016-01-09 (×3): qty 2

## 2016-01-09 NOTE — Plan of Care (Signed)
Problem: Phase III Progression Outcomes Goal: No anginal pain Outcome: Completed/Met Date Met:  01/09/16 Patient has been chest pain free today.

## 2016-01-09 NOTE — NC FL2 (Signed)
[place] MEDICAID FL2 LEVEL OF CARE SCREENING TOOL     IDENTIFICATION  Patient Name: Lee Chang Birthdate: 1941/02/27 Sex: male Admission Date (Current Location): 12/31/2015  [facility] [place] [place]   Facility and Address:  [facility], [address], [place], [place] [address]      Provider Number: [id number]  Attending Physician Name and Address:  Vesta MixerPhilip J Nahser, MD  Relative Name and Phone Number:  [name], daughter, [phone]    Current Level of Care: Hospital Recommended Level of Care: Skilled Nursing Facility Prior Approval Number:    Date Approved/Denied:   PASRR Number:    Discharge Plan: SNF    Current Diagnoses: Patient Active Problem List   Diagnosis Date Noted  . Encephalopathy acute   . Acute delirium   . Altered mental status   . Left arm swelling   . 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 Dyslipidemia, goal LDL below 70 01/02/2016  . Chest pain 01/01/2016  . CAP (community acquired pneumonia) 01/01/2016  . COPD (chronic obstructive pulmonary disease) (HCC) 01/01/2016  . Lung nodule 01/01/2016  . NSTEMI (non-ST elevated myocardial infarction) (HCC) 01/01/2016  . Acute chest pain   . Essential hypertension   . Bradycardia     Orientation RESPIRATION BLADDER Height & Weight     Self  Normal Incontinent, Indwelling catheter (Urinary catheter) Weight: 91.082 kg (200 lb 12.8 oz) Height:  5\' 8"  (172.7 cm)  BEHAVIORAL SYMPTOMS/MOOD NEUROLOGICAL BOWEL NUTRITION STATUS      Incontinent  (Please see DC summary)  AMBULATORY STATUS COMMUNICATION OF NEEDS Skin   Extensive Assist Verbally Normal                       Personal Care Assistance Level of Assistance  Bathing, Feeding, Dressing Bathing Assistance: Maximum assistance Feeding assistance: Limited assistance Dressing  Assistance: Maximum assistance     Functional Limitations Info  Hearing, Sight Sight Info: Impaired Hearing Info: Impaired      SPECIAL CARE FACTORS FREQUENCY  PT (By licensed PT)     PT Frequency: min 3x/week              Contractures      Additional Factors Info  Code Status, Allergies, Psychotropic Code Status Info: DNR Allergies Info: ASA Psychotropic Info: Seroquel         Current Medications (01/09/2016):  This is the current hospital active medication list Current Facility-Administered Medications  Medication Dose Route Frequency Provider Last Rate Last Dose  . acetaminophen (TYLENOL) tablet 650 mg  650 mg Oral Q6H PRN Meredeth IdeGagan S Lama, MD   650 mg at 01/05/16 1334   Or  . acetaminophen (TYLENOL) suppository 650 mg  650 mg Rectal Q6H PRN Meredeth IdeGagan S Lama, MD      . albuterol (PROVENTIL) (2.5 MG/3ML) 0.083% nebulizer solution 2.5 mg  2.5 mg Nebulization Q2H PRN Meredeth IdeGagan S Lama, MD      . aspirin EC tablet 81 mg  81 mg Oral Daily Runell GessJonathan J Berry, MD   81 mg at 01/09/16 1004  . atorvastatin (LIPITOR) tablet 80 mg  80 mg Oral q1800 Erick BlinksJehanzeb Memon, MD   80 mg at 01/08/16 1832  . dextrose 5 % solution   Intravenous Continuous Belkys A Regalado, MD 100 mL/hr at 01/09/16 1002    . feeding supplement (ENSURE  ENLIVE) (ENSURE ENLIVE) liquid 237 mL  237 mL Oral BID BM Vesta Mixer, MD   237 mL at 01/09/16 1003  . finasteride (PROSCAR) tablet 5 mg  5 mg Oral Daily Meredeth Ide, MD   5 mg at 01/09/16 1004  . food thickener (THICK IT) powder   Oral PRN Vesta Mixer, MD      . guaiFENesin [facility]) 12 hr tablet 600 mg  600 mg Oral BID Meredeth Ide, MD   600 mg at 01/09/16 1004  . hydrALAZINE (APRESOLINE) injection 10 mg  10 mg Intravenous Q6H PRN Leeann Must, MD   10 mg at 01/09/16 0001  . ipratropium-albuterol (DUONEB) 0.5-2.5 (3) MG/3ML nebulizer solution 3 mL  3 mL Nebulization PRN Vesta Mixer, MD      . losartan (COZAAR) tablet 100 mg  100 mg Oral Daily Lewayne Bunting, MD    100 mg at 01/09/16 1006  . metoprolol tartrate (LOPRESSOR) tablet 12.5 mg  12.5 mg Oral BID Runell Gess, MD   12.5 mg at 01/09/16 1006  . morphine 2 MG/ML injection 2 mg  2 mg Intravenous Q1H PRN Marykay Lex, MD      . nitroGLYCERIN (NITROSTAT) SL tablet 0.4 mg  0.4 mg Sublingual Q5 min PRN Vesta Mixer, MD   0.4 mg at 01/01/16 2023  . piperacillin-tazobactam (ZOSYN) IVPB 3.375 g  3.375 g Intravenous 3 times per day Earnie Larsson, RPH   3.375 g at 01/09/16 1002  . QUEtiapine (SEROQUEL) tablet 25 mg  25 mg Oral q1800 Ram Daniel Nones, MD   25 mg at 01/08/16 1832  . sodium chloride flush (NS) 0.9 % injection 3 mL  3 mL Intravenous Q12H Meredeth Ide, MD   3 mL at 01/08/16 1035  . sodium chloride flush (NS) 0.9 % injection 3 mL  3 mL Intravenous Q12H Joellyn Rued, MD   3 mL at 01/08/16 1035  . tamsulosin (FLOMAX) capsule 0.8 mg  0.8 mg Oral Daily Meredeth Ide, MD   0.8 mg at 01/09/16 1003  . ticagrelor (BRILINTA) tablet 90 mg  90 mg Oral BID Marykay Lex, MD   90 mg at 01/09/16 1005     Discharge Medications: Please see discharge summary for a list of discharge medications.  Relevant Imaging Results:  Relevant Lab Results:   Additional Information SSN: [id number] [place], [place]

## 2016-01-09 NOTE — Progress Notes (Signed)
PROGRESS NOTE  Lee Chang ZOX:[id number] DOB: [birth year] DOA: 12/31/2015 PCP: [name], MD  Assessment/Plan: Subjective; He is more alert today, oriented to place. recognize brother.    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 Delirium,.Confusion, tremors.  CT head no acute findings, EEG nonspecific likely multifactorial, including delirium, dehydration, metabolic abnormalities, fever.  Neurology following, exelon stopped on 3/13 (per daughter patient was started on exelon in 11/2015), neurology started seroquel on 3/13will defer neurology for meds adjustment. Improving.  More alert today   Hematuria:  UA with no significant WBC. Suspect traumatic in setting of blood thinner.  Monitor hb.  HB stable.  Will discontinue heparin.  DC foley.   Hypernatremia; continue with IV fluids to D 5.  Repeat labs in am.  Improved.  Diarrhea; after laxative. Monitor off laxative. If persist might need to check for C diff.   Fever, Leukocytosis. Presume secondary to PNA Fever trending down. WBC trending down.   Change to Zosyn 3-15  Repeat Chest x ray/ no acute finding.  [place] negative for cholecystitis.   AKI;  Hold cozaar, continue with IV fluids.  Might be multifactorial.    Mildly elevated ammonia level, started on lactulose.  Ammonia level normalized.   COPD exacerbation/CAP: continue with nebulizer, repeat another dose of solumedrol.   CAD/HTN, NSTEMI S/p R cardiac catheterization due to thromboemboli  mid-distal rPDA (Pt had 18 hrs Aggrastat IV) with infero- apical wall motion abnormality. 2 D Echo Nl LVF, EF of 60-65% EKG w/o acute changes.  Plan per cardiology  Left lung nodule Ct chest 3/11 showed Lobular right apical pulmonary nodule measures 2.2 x 1.5 x 1.3 cm with minimal spiculation, concerning for primary bronchogenic neoplasm. There is no adenopathy. Other than a subcentimeter lucency in T4 which is nonspecific, no evidence of metastatic disease For PET as OP.   Left arm swelling with ecchymoses. These findings are new, could be due to recent anticoagulation during cath LUE [place] no DVT   Dementia Per family patient was started on aricept, then exelon this year Diet modified per  swallow evaluation, on aspiration precaution   Code Status: DNR,   Family Communication: patient and son who was in the room.   Disposition Plan: per cardiology (primary team) discharge in 24 hours.    Consultants:  Cardiology primary  [facility] consultant for altered mental status, leukocytosis  Neurology consultant for confusion  Procedures:  Cardiac cath on 3/11  EEG   Antibiotics:  Rocephin/zithro from admission---change to Zosyn 3-15   Objective: BP 159/67 mmHg  Pulse 64  Temp(Src) 98.8 F (37.1 C) (Oral)  Resp 16  Ht  (1.727 m)  Wt 91.082 kg (200 lb 12.8 oz)  BMI 30.54 kg/m2  SpO2 96%  Intake/Output Summary (Last 24 hours) at 01/09/16 1137 Last data filed at 01/09/16 0800  Gross per 24 hour  Intake   3460 ml  Output   1450 ml  Net   2010 ml   Filed Weights   01/07/16 0409 01/08/16  [id number] 01/09/16 0500  Weight: 91.944 kg (202 lb 11.2 oz) 86.002 kg (189 lb 9.6 oz) 91.082 kg (200 lb 12.8 oz)    Exam:   General:  Alert in no distress  Cardiovascular: RRR  Respiratory: Bilateral Wheezing.   Abdomen: Soft/ND/NT, positive BS  Musculoskeletal: No Edema    Data Reviewed: Basic Metabolic Panel:  Recent Labs Lab 01/05/16 0853 01/06/16 0459 01/07/16 0530  01/08/16 0602 01/09/16 0726  NA 144 147* 151* 150* 142  K 3.9 3.9 3.8 3.3* 3.2*  CL 111 115* 117* 114* 105  CO2 21* 23 27 24 23   GLUCOSE 140* 126* 127* 126* 142*  BUN 39* 41* 30* 20 17  CREATININE 1.98* 1.55* 1.33* 1.20 1.03  CALCIUM 9.3 9.2 9.5 9.7 9.2   Liver Function Tests:  Recent Labs Lab 01/03/16 0049 01/05/16 0853 01/07/16 0530  AST 58* 26 15  ALT 24 18 15*  ALKPHOS 59 49 40  BILITOT 1.4* 0.9 0.7  PROT 6.3* 5.9* 5.3*  ALBUMIN 3.5 3.2* 2.7*   No results for input(s): LIPASE, AMYLASE in the last 168 hours.  Recent Labs Lab 01/03/16 1215 01/05/16 0320  AMMONIA 39* 35   CBC:  Recent Labs Lab 01/03/16 0352 01/03/16 1220 01/04/16 0254 01/05/16 0320 01/06/16 0459 01/06/16 1843 01/07/16 0530 01/08/16 1516  WBC 16.6*  --  17.5* 17.5* 14.0*  --  11.7*  --   NEUTROABS  --  14.2*  --   --  10.8*  --   --   --   HGB 14.8  --  14.5 13.9 11.9* 12.4* 12.0* 13.4  HCT 44.8  --  44.2 42.8 37.6* 39.7 38.9* 42.7  MCV 88.4  --  89.3 89.9 91.7  --  92.8  --   PLT 226  --  207 207 179  --  199  --    Cardiac Enzymes:   No results for input(s): CKTOTAL, CKMB, CKMBINDEX, TROPONINI in the last 168 hours. BNP (last 3 results)  Recent Labs  01/01/16 1840  BNP 36.1    ProBNP (last 3 results) No results for input(s): PROBNP in the last 8760 hours.  CBG:  Recent Labs Lab 01/06/16 0745 01/07/16 0408 01/07/16 0726 01/08/16 0757 01/09/16 0804  GLUCAP 88 160* 89 134* 141*    Recent Results (from the past 240 hour(s))  Blood culture (routine x 2)     Status: None   Collection Time: 01/01/16  1:14 AM  Result Value Ref Range Status   Specimen Description BLOOD RIGHT ANTECUBITAL DRAWN BY RN  Final   Special Requests BOTTLES DRAWN AEROBIC AND ANAEROBIC 6CC  Final   Culture NO GROWTH 5 DAYS  Final   Report Status 01/06/2016 FINAL  Final  Blood culture (routine x 2)     Status: None   Collection Time: 01/01/16  1:25 AM  Result Value Ref Range Status   Specimen  Description BLOOD LEFT ANTECUBITAL  Final   Special Requests BOTTLES DRAWN AEROBIC ONLY 6CC  Final   Culture NO GROWTH 5 DAYS  Final   Report Status 01/06/2016 FINAL  Final  MRSA PCR Screening     Status: None   Collection Time: 01/01/16  2:45 AM  Result Value Ref Range Status   MRSA by PCR NEGATIVE NEGATIVE Final    Comment:        The GeneXpert MRSA Assay (FDA approved for NASAL specimens only), is one component of a comprehensive MRSA colonization surveillance program. It is not intended to diagnose  MRSA infection nor to guide or monitor treatment for MRSA infections.   Culture, blood (routine x 2)     Status: None   Collection Time: 01/03/16 12:38 AM  Result Value Ref Range Status   Specimen Description BLOOD RIGHT ARM  Final   Special Requests IN PEDIATRIC BOTTLE 3CC  Final   Culture NO GROWTH 5 DAYS  Final   Report Status 01/08/2016 FINAL  Final  Culture, blood (routine x 2)     Status: None   Collection Time: 01/03/16 12:42 AM  Result Value Ref Range Status   Specimen Description BLOOD RIGHT ARM  Final   Special Requests IN PEDIATRIC BOTTLE 3CC  Final   Culture NO GROWTH 5 DAYS  Final   Report Status 01/08/2016 FINAL  Final  Culture, Urine     Status: None   Collection Time: 01/05/16 10:25 PM  Result Value Ref Range Status   Specimen Description URINE, CATHETERIZED  Final   Special Requests NONE  Final   Culture NO GROWTH 1 DAY  Final   Report Status 01/07/2016 FINAL  Final     Studies: No results found.  Scheduled Meds: . aspirin EC  81 mg Oral Daily  . atorvastatin  80 mg Oral q1800  . feeding supplement (ENSURE ENLIVE)  237 mL Oral BID BM  . finasteride  5 mg Oral Daily  . guaiFENesin  600 mg Oral BID  . losartan  100 mg Oral Daily  . metoprolol tartrate  12.5 mg Oral BID  . piperacillin-tazobactam (ZOSYN)  IV  3.375 g Intravenous 3 times per day  . QUEtiapine  25 mg Oral q1800  . sodium chloride flush  3 mL Intravenous Q12H  . sodium chloride flush   3 mL Intravenous Q12H  . tamsulosin  0.8 mg Oral Daily  . ticagrelor  90 mg Oral BID    Continuous Infusions: . dextrose 100 mL/hr at 01/09/16 1002     Time spent:  Hartley Barefoot A MD [facility] Pager [phone] If 7PM-7AM, please contact night-coverage at [email or web address], password [facility] 01/09/2016, 11:37 AM  LOS: 8 days

## 2016-01-09 NOTE — Clinical Social Work Note (Signed)
Clinical Social Work Assessment  Patient Details  Name: Lee Chang MRN: [id number] Date of Birth: May 26, 1941  Date of referral:  01/09/16               Reason for consult:  Facility Placement                Permission sought to share information with:  Facility Medical sales representativeContact Representative, Family Supports Permission granted to share information::  No (Patient disoriented; completed assessment with patient's daughter)  Name::     Lee Chang  [facility]  Relationship::  daughter  Contact Information:  [phone]  Housing/Transportation Living arrangements for the past 2 months:  Single Family Home Source of Information:  Adult Children Patient Interpreter Needed:  None Criminal Activity/Legal Involvement Pertinent to Current Situation/Hospitalization:  No - Comment as needed Significant Relationships:  Adult Children Lives with:  Self Do you feel safe going back to the place where you live?  No Need for family participation in patient care:  Yes (Comment)  Care giving concerns:  CSW received referral for possible SNF placement at time of discharge. Patient is disoriented. CSW spoke with patient's daughter regarding PT recommendation of SNF placement at time of discharge. Per patient's daughter, patient is currently unable to care for patient at home given patient's current physical needs and fall risk. Patient's daughter expressed understanding of PT recommendation and is agreeable to SNF placement at time of discharge. CSW to continue to follow and assist with discharge planning needs.   Social Worker assessment / plan:  CSW spoke with patient's daughter concerning [facility].  Employment status:  Retired Database administratornsurance information:  Managed Medicare PT Recommendations:  Skilled Nursing Facility Information / Referral to community resources:  Skilled Nursing Facility  Patient/Family's Response to care:  Patient's daughter  recognizes need for rehab before returning home and [place]. Patient's [facility]  Patient/Family's Understanding of and Emotional Response to Diagnosis, Current Treatment, and Prognosis: Patient's daughter is realistic regarding therapy needs. No questions/concerns about plan or treatment.   Emotional Assessment Appearance:  Appears stated age Attitude/Demeanor/Rapport:  Unable to Assess Affect (typically observed):  Unable to Assess Orientation:  Oriented to Self Alcohol / Substance use:  Not Applicable Psych involvement (Current and /or in the community):  No (Comment)  Discharge Needs  Concerns to be addressed:  Care Coordination Readmission within the last 30 days:  No Current discharge risk:  None Barriers to Discharge:  Continued Medical Work up   Lee Chang, LCSWA 01/09/2016, 12:34 PM

## 2016-01-09 NOTE — Progress Notes (Signed)
    Subjective:  Denies CP or dyspnea; alert and oriented   Objective:  Filed Vitals:   01/08/16 2114 01/09/16 0001 01/09/16 0451 01/09/16 0500  BP:  173/68 159/67   Pulse:      Temp:   98.8 F (37.1 C)   TempSrc:   Oral   Resp:   16   Height:      Weight:    200 lb 12.8 oz (91.082 kg)  SpO2: 95%  96%     Intake/Output from previous day:  Intake/Output Summary (Last 24 hours) at 01/09/16 0842 Last data filed at 01/09/16 0600  Gross per 24 hour  Intake   3360 ml  Output   1450 ml  Net   1910 ml    Physical Exam: Physical exam: Well-developed well-nourished in no acute distress.  Skin is warm and dry.  HEENT is normal.  Neck is supple.  Chest CTA Cardiovascular exam is regular rate and rhythm.  Abdominal exam nontender or distended. No masses palpated. Extremities show no edema. neuro grossly intact    Lab Results: Basic Metabolic Panel:  Recent Labs  47/82/9503/18/17 0602 01/09/16 0726  NA 150* 142  K 3.3* 3.2*  CL 114* 105  CO2 24 23  GLUCOSE 126* 142*  BUN 20 17  CREATININE 1.20 1.03  CALCIUM 9.7 9.2   CBC:  Recent Labs  01/07/16 0530 01/08/16 1516  WBC 11.7*  --   HGB 12.0* 13.4  HCT 38.9* 42.7  MCV 92.8  --   PLT 199  --      Assessment/Plan:  1 status post myocardial infarction-patient has had PCI of his right coronary artery. Continue aspirin, brilinta and statin. Continue metoprolol. 2 Hypernatremia-improved; continue D5W; DC in AM if Na normal. 3 acute kidney disease-renal function is improving with hydration. 4 lung nodule patient will need an outpatient PET scan. 5 delirium-improving. 6 Hypertension-Blood pressure elevated. Change cozaar to 100 mg daily and follow. 7 Hypokalemia-supplement 8 Hematuria-felt related to trauma; need to DC foley soon and resume sq heparin.  We are now arranging Transfer to a rehabilitation facility prior to returning home.   Olga MillersBrian Zahari Fazzino 01/09/2016, 8:42 AM

## 2016-01-10 ENCOUNTER — Encounter (HOSPITAL_COMMUNITY): Payer: Self-pay | Admitting: Urology

## 2016-01-10 ENCOUNTER — Inpatient Hospital Stay (HOSPITAL_COMMUNITY): Payer: Medicare Other

## 2016-01-10 DIAGNOSIS — E870 Hyperosmolality and hypernatremia: Secondary | ICD-10-CM

## 2016-01-10 DIAGNOSIS — R319 Hematuria, unspecified: Secondary | ICD-10-CM

## 2016-01-10 DIAGNOSIS — J441 Chronic obstructive pulmonary disease with (acute) exacerbation: Secondary | ICD-10-CM

## 2016-01-10 DIAGNOSIS — N179 Acute kidney failure, unspecified: Secondary | ICD-10-CM

## 2016-01-10 LAB — URINE MICROSCOPIC-ADD ON

## 2016-01-10 LAB — URINALYSIS, ROUTINE W REFLEX MICROSCOPIC
BILIRUBIN URINE: NEGATIVE
Glucose, UA: NEGATIVE mg/dL
KETONES UR: 15 mg/dL — AB
NITRITE: NEGATIVE
PH: 6.5 (ref 5.0–8.0)
Protein, ur: 100 mg/dL — AB
Specific Gravity, Urine: 1.014 (ref 1.005–1.030)

## 2016-01-10 LAB — BASIC METABOLIC PANEL
Anion gap: 10 (ref 5–15)
BUN: 15 mg/dL (ref 6–20)
CALCIUM: 8.7 mg/dL — AB (ref 8.9–10.3)
CO2: 24 mmol/L (ref 22–32)
CREATININE: 1.09 mg/dL (ref 0.61–1.24)
Chloride: 108 mmol/L (ref 101–111)
Glucose, Bld: 96 mg/dL (ref 65–99)
Potassium: 3.2 mmol/L — ABNORMAL LOW (ref 3.5–5.1)
SODIUM: 142 mmol/L (ref 135–145)

## 2016-01-10 LAB — GLUCOSE, CAPILLARY: Glucose-Capillary: 107 mg/dL — ABNORMAL HIGH (ref 65–99)

## 2016-01-10 LAB — HEMOGLOBIN AND HEMATOCRIT, BLOOD
HCT: 41.2 % (ref 39.0–52.0)
HEMOGLOBIN: 13.5 g/dL (ref 13.0–17.0)

## 2016-01-10 MED ORDER — CARVEDILOL 12.5 MG PO TABS
12.5000 mg | ORAL_TABLET | Freq: Two times a day (BID) | ORAL | Status: DC
  Administered 2016-01-10 – 2016-01-11 (×2): 12.5 mg via ORAL
  Filled 2016-01-10 (×2): qty 1

## 2016-01-10 MED ORDER — POTASSIUM CHLORIDE CRYS ER 20 MEQ PO TBCR
40.0000 meq | EXTENDED_RELEASE_TABLET | Freq: Two times a day (BID) | ORAL | Status: AC
Start: 2016-01-10 — End: 2016-01-10
  Administered 2016-01-10 (×2): 40 meq via ORAL
  Filled 2016-01-10 (×2): qty 2

## 2016-01-10 NOTE — Plan of Care (Signed)
Problem: Phase I Progression Outcomes Goal: Voiding-avoid urinary catheter unless indicated Outcome: Not Progressing Foley catheter reinserted for urinary retention

## 2016-01-10 NOTE — Progress Notes (Signed)
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 Delirium,.Confusion, tremors.  CT head no acute findings, EEG nonspecific likely multifactorial, including delirium, dehydration, metabolic abnormalities, fever.  Neurology following, exelon stopped on 3/13 (per daughter patient was started on exelon in 11/2015), neurology started seroquel on 3/13will defer neurology for meds adjustment. Improving.   Hematuria:  UA with no significant WBC. Suspect traumatic in setting of blood thinner.  Monitor hb.  HB stable.  discontinue heparin.  Urology consultation pending.    Hypernatremia; continue with IV fluids to D 5.  Repeat labs in am.  Improved.   Diarrhea; after laxative. Monitor off laxative. If persist might need to check for C diff.   Fever, Leukocytosis. Presume secondary to PNA Fever trending down. WBC trending down.   Change to Zosyn 3-15 , day 6  Repeat Chest x ray/ no acute finding.  US negative for cholecystitis.   AKI;  Hold cozaar, continue with IV fluids.  Might be multifactorial.    Mildly elevated ammonia level, started on lactulose.  Ammonia level normalized.   COPD exacerbation/CAP: continue with nebulizer, repeat another dose of  solumedrol.   CAD/HTN, NSTEMI S/p R cardiac catheterization due to thromboemboli mid-distal rPDA (Pt had 18 hrs Aggrastat IV) with infero- apical wall motion abnormality. 2 D Echo Nl LVF, EF of 60-65% EKG w/o acute changes.  Plan per cardiology  Left lung nodule Ct chest 3/11 showed Lobular right apical pulmonary nodule measures 2.2 x 1.5 x 1.3 cm with minimal spiculation, concerning for primary bronchogenic neoplasm. There is no adenopathy. Other than a subcentimeter lucency in T4 which is nonspecific, no evidence of metastatic disease For PET as OP.   Left arm swelling with ecchymoses. These findings are new, could be due to recent anticoagulation during cath LUE [place] no DVT   Dementia Per family patient was started on aricept, then exelon this year Diet modified per  swallow evaluation, on aspiration precaution   Code Status: DNR,   Family Communication: patient and son who was in the room.   Disposition Plan: per cardiology (primary team) discharge in 24 hours.    Consultants:  Cardiology primary  [facility] consultant for altered mental status, leukocytosis  Neurology consultant for confusion  Procedures:  Cardiac cath on 3/11  EEG   Antibiotics:  Rocephin/zithro from admission---change to Zosyn 3-15   Objective: BP 172/67 mmHg  Pulse 65  Temp(Src) 98.7 F (37.1 C) (Oral)  Resp 18  Ht 5\' 8"  (1.727 m)  Wt 86.274 kg (190 lb 3.2 oz)  BMI 28.93 kg/m2  SpO2 97%  Intake/Output Summary (Last 24 hours) at 01/10/16 1434 Last data filed at 01/10/16 0500  Gross per 24 hour  Intake   1411 ml  Output   1275 ml  Net    136 ml   Filed Weights   01/08/16 0325 01/09/16 0500 01/10/16 0500  Weight: 86.002 kg (189 lb 9.6 oz) 91.082 kg (200 lb 12.8 oz) 86.274 kg (190 lb 3.2 oz)    Exam:   General:  Alert in no distress  Cardiovascular: RRR  Respiratory: Bilateral Wheezing.   Abdomen: Soft/ND/NT, positive BS  Musculoskeletal: No Edema    Data Reviewed: Basic  Metabolic Panel:  Recent Labs Lab 01/06/16 0459 01/07/16 0530 01/08/16 0602 01/09/16 0726 01/10/16 0609  NA 147* 151* 150* 142 142  K 3.9 3.8 3.3* 3.2* 3.2*  CL 115* 117* 114* 105 108  CO2 GLUCOSE 126* 127* 126* 142* 96  BUN 41* 30* CREATININE 1.55* 1.33* 1.20 1.03 1.09  CALCIUM 9.2 9.5 9.7 9.2 8.7*   Liver Function Tests:  Recent Labs Lab 01/05/16 0853 01/07/16 0530  AST 26 15  ALT 18 15*  ALKPHOS 49 40  BILITOT 0.9 0.7  PROT 5.9* 5.3*  ALBUMIN 3.2* 2.7*   No results for input(s): LIPASE, AMYLASE in the last 168 hours.  Recent Labs Lab 01/05/16 0320  AMMONIA 35   CBC:  Recent Labs Lab 01/04/16 0254 01/05/16 0320 01/06/16 0459 01/06/16 1843 01/07/16 0530 01/08/16 1516 01/10/16 1030  WBC 17.5* 17.5* 14.0*  --  11.7*  --   --   NEUTROABS  --   --  10.8*  --   --   --   --   HGB 14.5 13.9 11.9* 12.4* 12.0* 13.4 13.5  HCT 44.2 42.8 37.6* 39.7 38.9* 42.7 41.2  MCV 89.3 89.9 91.7  --  92.8  --   --   PLT 207 207 179  --  199  --   --    Cardiac Enzymes:   No results for input(s): CKTOTAL, CKMB, CKMBINDEX, TROPONINI in the last 168 hours. BNP (last 3 results)  Recent Labs  01/01/16 1840  BNP 36.1    ProBNP (last 3 results) No results for input(s): PROBNP in the last 8760 hours.  CBG:  Recent Labs Lab 01/07/16 0408 01/07/16 0726 01/08/16 0757 01/09/16 0804 01/10/16 0803  GLUCAP 160* 89 134* 141* 107*    Recent Results (from the past 240 hour(s))  Blood culture (routine x 2)     Status: None   Collection Time: 01/01/16  1:14 AM  Result Value Ref Range Status   Specimen Description BLOOD RIGHT ANTECUBITAL DRAWN BY RN  Final   Special Requests BOTTLES DRAWN AEROBIC AND ANAEROBIC 6CC  Final   Culture NO GROWTH 5 DAYS  Final   Report Status 01/06/2016 FINAL  Final  Blood culture (routine x 2)     Status: None   Collection Time: 01/01/16  1:25 AM  Result Value Ref Range Status   Specimen Description BLOOD LEFT  ANTECUBITAL  Final   Special Requests BOTTLES DRAWN AEROBIC ONLY 6CC  Final   Culture NO GROWTH 5 DAYS  Final   Report Status 01/06/2016 FINAL  Final  MRSA PCR Screening     Status: None   Collection Time: 01/01/16  2:45 AM  Result Value Ref Range Status   MRSA by PCR NEGATIVE NEGATIVE Final    Comment:        The GeneXpert MRSA Assay (FDA approved for NASAL specimens only), is one component of a comprehensive MRSA colonization surveillance program. It is not intended to diagnose MRSA infection nor to guide or monitor treatment  for MRSA infections.   Culture, blood (routine x 2)     Status: None   Collection Time: 01/03/16 12:38 AM  Result Value Ref Range Status   Specimen Description BLOOD RIGHT ARM  Final   Special Requests IN PEDIATRIC BOTTLE 3CC  Final   Culture NO GROWTH 5 DAYS  Final   Report Status 01/08/2016 FINAL  Final  Culture, blood (routine x 2)     Status: None   Collection Time: 01/03/16 12:42 AM  Result Value Ref Range Status   Specimen Description BLOOD RIGHT ARM  Final   Special Requests IN PEDIATRIC BOTTLE 3CC  Final   Culture NO GROWTH 5 DAYS  Final   Report Status 01/08/2016 FINAL  Final  Culture, Urine     Status: None   Collection Time: 01/05/16 10:25 PM  Result Value Ref Range Status   Specimen Description URINE, CATHETERIZED  Final   Special Requests NONE  Final   Culture NO GROWTH 1 DAY  Final   Report Status 01/07/2016 FINAL  Final     Studies: No results found.  Scheduled Meds: . aspirin EC  81 mg Oral Daily  . atorvastatin  80 mg Oral q1800  . carvedilol  12.5 mg Oral BID WC  . feeding supplement (ENSURE ENLIVE)  237 mL Oral BID BM  . finasteride  5 mg Oral Daily  . guaiFENesin  600 mg Oral BID  . losartan  100 mg Oral Daily  . piperacillin-tazobactam (ZOSYN)  IV  3.375 g Intravenous 3 times per day  . potassium chloride  40 mEq Oral BID  . QUEtiapine  25 mg Oral q1800  . sodium chloride flush  3 mL Intravenous Q12H  . sodium  chloride flush  3 mL Intravenous Q12H  . tamsulosin  0.8 mg Oral Daily  . ticagrelor  90 mg Oral BID    Continuous Infusions: . dextrose 100 mL/hr at 01/10/16 0201     Time spent:  Hartley Barefoot A MD [facility] Pager [phone] If 7PM-7AM, please contact night-coverage at [email or web address], password [facility] 01/10/2016, 2:34 PM  LOS: 9 days

## 2016-01-10 NOTE — Plan of Care (Signed)
Problem: Phase I Progression Outcomes Goal: Voiding-avoid urinary catheter unless indicated Outcome: Not Applicable Date Met:  92/44/62 Pt has indwelling cath for urinary retention

## 2016-01-10 NOTE — Progress Notes (Signed)
Physical Therapy Treatment Patient Details Name: Lee Chang MRN: [id number] DOB: [birth year] Today's Date: 01/10/2016    History of Present Illness 75 y.o. male with a Past Medical History of peptic ulcer disease, COPD, and NSTEMI, right lung nodule, HLD, dementia who presented on 01/01/2015 with chest pain and pneumonia. Patient with NSTEMI and underwent cardiac catheterization with placement of DES on 01/02/2016    PT Comments    Patient able to stand pivot to [facility] with 2 A.  Still weak and, today per Lee Chang, more confused.  Continue to recommend SNF level rehab at d/c.  Follow Up Recommendations  SNF;Supervision/Assistance - 24 hour     Equipment Recommendations  Other (comment) (TBA)    Recommendations for Other Services       Precautions / Restrictions Precautions Precautions: Fall    Mobility  Bed Mobility Overal bed mobility: Needs Assistance Bed Mobility: Rolling;Sidelying to Sit;Supine to Sit Rolling: Mod assist Sidelying to sit: Max assist Supine to sit: Mod assist     General bed mobility comments: assist for rolling with max cues and use of rail, assist for legs off bed and to lift trunk upright; assist for legs into bed for return to supine, +2 A for scooting to [facility]  Transfers Overall transfer level: Needs assistance Equipment used: Rolling walker (2 wheeled) Transfers: Sit to/from [employer] Sit to Stand: Mod assist;+2 physical assistance Stand pivot transfers: Mod assist;+2 physical assistance       General transfer comment: up from EOB assist and cues for anterior weight shift; then for lifting; stand pivot to [facility] with RW +2 assist to keep walker close as pt pushing back and leaning back; assist to stand from [facility] imiproved to +2 minA with cue to push up from armrest on [facility]  Ambulation/Gait                 Stairs            Wheelchair Mobility    Modified Rankin (Stroke Patients Only)       Balance Overall  balance assessment: Needs assistance   Sitting balance-Leahy Scale: Poor Sitting balance - Comments: leaning back in sitting, mod to min support for EOB; able to sit briefly with supervision after cues to reach down below knees Postural control: Posterior lean                          Cognition Arousal/Alertness: Awake/alert Behavior During Therapy: WFL for tasks assessed/performed Overall Cognitive Status: Impaired/Different from baseline Area of Impairment: Memory;Orientation;Problem solving;Safety/judgement Orientation Level: Disoriented to;Place;Situation;Time   Memory: Decreased short-term memory   Safety/Judgement: Decreased awareness of safety;Decreased awareness of deficits   Problem Solving: Slow processing;Requires verbal cues;Difficulty sequencing      Exercises      General Comments General comments (skin integrity, edema, etc.): Lee Chang in the room and reports pt more confused today      Pertinent Vitals/Pain Pain Assessment: No/denies pain    Home Living                      Prior Function            PT Goals (current goals can now be found in the care plan section) Progress towards PT goals: Progressing toward goals    Frequency  Min 3X/week    PT Plan Current plan remains appropriate    Co-evaluation  End of Session Equipment Utilized During Treatment: Gait belt Activity Tolerance: Patient limited by fatigue Patient left: with call bell/phone within reach;in bed;with family/visitor present     Time: [id number] PT Time Calculation (min) (ACUTE ONLY): 28 min  Charges:  $Therapeutic Activity: 23-37 mins                    G Codes:      Elray McgregorCynthia Wynn 01/10/2016, 4:53 PM  Sheran Lawlessyndi Wynn, PT [phone] 01/10/2016

## 2016-01-10 NOTE — Care Management Important Message (Signed)
Important Message  Patient Details  Name: Sonda Rumblelmer W Bran MRN: [id number] Date of Birth: 12-Sep-1941   Medicare Important Message Given:  Yes    Shiori Adcox P Josy Peaden 01/10/2016, 3:41 PM

## 2016-01-10 NOTE — Progress Notes (Signed)
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  Continues to have hematuria and was unable to void after foley removed early this AM resulting in replacement.  Inpatient Medications    . aspirin EC  81 mg Oral Daily  . atorvastatin  80 mg Oral q1800  . feeding supplement (ENSURE ENLIVE)  237 mL Oral BID BM  . finasteride  5 mg Oral Daily  . guaiFENesin  600 mg Oral BID  . losartan  100 mg Oral Daily  . metoprolol tartrate  12.5 mg Oral BID  . piperacillin-tazobactam (ZOSYN)  IV  3.375 g Intravenous 3 times per day  . potassium chloride  40 mEq Oral BID  . QUEtiapine  25 mg Oral q1800  . sodium chloride flush  3 mL Intravenous Q12H  . sodium chloride flush  3 mL Intravenous Q12H  . tamsulosin  0.8 mg Oral Daily  . ticagrelor  90 mg Oral BID    Vital Signs    Filed Vitals:   01/09/16 2307 01/10/16 0026 01/10/16 0500 01/10/16 0759  BP: 168/68 154/73 143/93 176/73  Pulse:  63 52 67  Temp:  98.6 F (37 C) 98.7 F (37.1 C)   TempSrc:      Resp:  Height:      Weight:   190 lb 3.2 oz (86.274 kg)   SpO2:  98% 96% 96%    Intake/Output Summary (Last 24 hours) at 01/10/16 0907 Last data filed at 01/10/16 0500  Gross per 24 hour  Intake   1531 ml  Output   1575 ml  Net    -44 ml   Filed Weights   01/08/16 0325 01/09/16 0500 01/10/16 0500    Weight: 189 lb 9.6 oz (86.002 kg) 200 lb 12.8 oz (91.082 kg) 190 lb 3.2 oz (86.274 kg)    Physical Exam    General: Pleasant, NAD. Neuro: Alert and oriented X 3. Moves all extremities spontaneously. Psych: Normal affect. HEENT:  Normal  Neck: Supple without bruits or JVD. Lungs:  Resp regular and unlabored, scattered exp wheezing, bibasilar crackles. Heart: RRR no s3, s4, or murmurs. Abdomen: Soft, non-tender, non-distended, BS + x 4.  Extremities: No clubbing, cyanosis or edema. DP/PT/Radials 2+ and equal bilaterally.  R radial cath site w/o bleeding/bruit/hematoma.  bilat upper ext fairly ecchymotic to elbows.  Labs    CBC  Recent Labs  01/08/16 1516  HGB 13.4  HCT 42.7   Basic Metabolic Panel  Recent Labs  01/09/16 0726 01/10/16 0609  NA 142 142  K 3.2* 3.2*  CL 105 108  CO2 23 24  GLUCOSE 142* 96  BUN 17 15  CREATININE 1.03 1.09  CALCIUM 9.2 8.7*    Telemetry    RSR, 11 beats NSVT  Radiology  Dg Chest 1 View  01/05/2016  CLINICAL DATA:  Leukocytosis.  Confusion. EXAM: CHEST 1 VIEW COMPARISON:  01/03/2016 FINDINGS: Single view of the chest demonstrates prominent lung markings which are unchanged and appear to be chronic. There is no focal airspace disease or pulmonary edema. Heart and mediastinum are within normal limits and stable. Osseous structures appear to be intact. IMPRESSION: No acute chest findings. Electronically Signed   By: Richarda Overlie M.D.   On: 01/05/2016 10:06   Ct Head Wo Contrast  01/03/2016  CLINICAL DATA:  Delirium, altered mental status. Recent myocardial infarction, history of hypertension and dyslipidemia. EXAM: CT HEAD WITHOUT CONTRAST TECHNIQUE: Contiguous axial images were obtained from the base of the skull through the vertex without intravenous contrast. COMPARISON:  MRI of the head August 20, 2015 FINDINGS: The ventricles and sulci are normal for age. No intraparenchymal hemorrhage, mass effect nor midline shift. Patchy  supratentorial white matter hypodensities are less than expected for patient's age and though non-specific suggest sequelae of chronic small vessel ischemic disease. No acute large vascular territory infarcts. Old LEFT basal ganglia lacunar infarct. No abnormal extra-axial fluid collections. Basal cisterns are patent. 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 No skull fracture. The included ocular globes and orbital contents are non-suspicious. Status post bilateral ocular lens implants. Mild paranasal sinus mucosal thickening. Mastoid air cells are well aerated. Patient is edentulous. IMPRESSION: No acute intracranial process. Involutional changes and minimal chronic small vessel ischemic disease. Old LEFT basal ganglia lacunar infarct. Electronically Signed   By: Awilda Metro M.D.   On: 01/03/2016 01:46   Dg Chest Port 1 View  01/03/2016  CLINICAL DATA:  Hypoxemia, confusion, fever, asthma, essential hypertension, coronary artery disease post NSTEMI, COPD EXAM: PORTABLE CHEST 1 VIEW COMPARISON:  Portable exam 1007 hours compared to 08/18/2015 FINDINGS: Normal heart size, mediastinal contours, and pulmonary vascularity. Bronchitic changes accentuated interstitial markings. Bibasilar atelectasis versus infiltrate. Upper lungs clear. No pleural effusion or pneumothorax. IMPRESSION: Bronchitic changes with bibasilar atelectasis versus infiltrate. Electronically Signed   By: Ulyses Southward M.D.   On: 01/03/2016 10:19   Ct Angio Chest Aorta W/cm &/or Wo/cm  01/01/2016  CLINICAL DATA:  Sudden onset of central chest pain radiating to the back. Rule out dissection. EXAM: CT ANGIOGRAPHY CHEST, ABDOMEN AND PELVIS TECHNIQUE: Multidetector CT imaging through the chest, abdomen and pelvis was performed using the standard protocol during bolus administration of intravenous contrast. Multiplanar reconstructed images and MIPs were obtained and reviewed to evaluate the vascular anatomy. CONTRAST:   OMNIPAQUE IOHEXOL 350 MG/ML SOLN COMPARISON:  None. FINDINGS: CTA CHEST FINDINGS Normal caliber thoracic aorta without dissection, aneurysm, hematoma or acute aortic syndrome. Mild atherosclerosis. Conventional branching pattern from the aortic arch. No filling defects in the pulmonary arteries to the segmental level. Heart is normal in size. Coronary artery calcification versus stent. Scattered normal size mediastinal nodes, no pathologic adenopathy. No hilar adenopathy. Lobular right apical nodule measures 1.5 x 1.3 x 2.2 cm with minimal surrounding spiculation. No additional suspicious pulmonary nodule. Faint reticular nodular subpleural opacity in the anterior right upper lobe likely infectious or inflammatory. No consolidation or pulmonary edema. Sub cm lucency within T4 vertebral body, nonspecific. No sclerotic lesions. No acute osseous abnormality. Review of the MIP images confirms the above findings. CTA ABDOMEN AND PELVIS FINDINGS Normal caliber abdominal aorta without aneurysm, dissection, hematoma or acute aortic abnormality. Moderate diffuse atherosclerosis. Celiac, superior mesenteric, and inferior mesenteric arteries are patent. Single bilateral renal arteries are patent. Atherosclerosis of  both iliac arteries without significant stenosis. Arterial phase imaging of the liver, spleen, and pancreas are normal. No adrenal nodule. Symmetric renal enhancement. Exophytic lower pole cyst on the left measures 2.1 cm. Stomach distended with ingested contents. There are no dilated or thickened bowel loops. Distal small bowel loops are fluid-filled. Colonic tortuosity with small to moderate stool burden. No colonic wall thickening. Small retroperitoneal lymph nodes, not enlarged by size criteria. No pelvic adenopathy. Enlarged prostate gland measuring 6.6 cm transverse with mass effect on the bladder base. Bladder physiologically distended. There is fat in the right inguinal canal. There are no acute or  suspicious osseous abnormalities. Review of the MIP images confirms the above findings. IMPRESSION: 1. Normal caliber thoracoabdominal aorta without dissection or acute aortic abnormality. Moderate atherosclerosis. 2. Lobular right apical pulmonary nodule measures 2.2 x 1.5 x 1.3 cm with minimal spiculation, concerning for primary bronchogenic neoplasm. There is no adenopathy. Other than a subcentimeter lucency in T4 which is nonspecific, no evidence of metastatic disease on this arterial phase imaging exams. This bone lucency may simply represent normal variation. Recommend PET-CT characterization. 3. Faint reticulonodular opacity in the subpleural right lower lobe is likely infectious or inflammatory. 4. No acute abnormality in the abdomen/pelvis. Prostatomegaly is incidentally noted. Electronically Signed   By: Rubye OaksMelanie  Ehinger M.D.   On: 01/01/2016 00:47   Ct Cta Abd/pel W/cm &/or W/o Cm  01/01/2016  CLINICAL DATA:  Sudden onset of central chest pain radiating to the back. Rule out dissection. EXAM: CT ANGIOGRAPHY CHEST, ABDOMEN AND PELVIS TECHNIQUE: Multidetector CT imaging through the chest, abdomen and pelvis was performed using the standard protocol during bolus administration of intravenous contrast. Multiplanar reconstructed images and MIPs were obtained and reviewed to evaluate the vascular anatomy. CONTRAST:  100mL OMNIPAQUE IOHEXOL 350 MG/ML SOLN COMPARISON:  None. FINDINGS: CTA CHEST FINDINGS Normal caliber thoracic aorta without dissection, aneurysm, hematoma or acute aortic syndrome. Mild atherosclerosis. Conventional branching pattern from the aortic arch. No filling defects in the pulmonary arteries to the segmental level. Heart is normal in size. Coronary artery calcification versus stent. Scattered normal size mediastinal nodes, no pathologic adenopathy. No hilar adenopathy. Lobular right apical nodule measures 1.5 x 1.3 x 2.2 cm with minimal surrounding spiculation. No additional suspicious  pulmonary nodule. Faint reticular nodular subpleural opacity in the anterior right upper lobe likely infectious or inflammatory. No consolidation or pulmonary edema. Sub cm lucency within T4 vertebral body, nonspecific. No sclerotic lesions. No acute osseous abnormality. Review of the MIP images confirms the above findings. CTA ABDOMEN AND PELVIS FINDINGS Normal caliber abdominal aorta without aneurysm, dissection, hematoma or acute aortic abnormality. Moderate diffuse atherosclerosis. Celiac, superior mesenteric, and inferior mesenteric arteries are patent. Single bilateral renal arteries are patent. Atherosclerosis of both iliac arteries without significant stenosis. Arterial phase imaging of the liver, spleen, and pancreas are normal. No adrenal nodule. Symmetric renal enhancement. Exophytic lower pole cyst on the left measures 2.1 cm. Stomach distended with ingested contents. There are no dilated or thickened bowel loops. Distal small bowel loops are fluid-filled. Colonic tortuosity with small to moderate stool burden. No colonic wall thickening. Small retroperitoneal lymph nodes, not enlarged by size criteria. No pelvic adenopathy. Enlarged prostate gland measuring 6.6 cm transverse with mass effect on the bladder base. Bladder physiologically distended. There is fat in the right inguinal canal. There are no acute or suspicious osseous abnormalities. Review of the MIP images confirms the above findings. IMPRESSION: 1. Normal caliber thoracoabdominal aorta without dissection or acute aortic  abnormality. Moderate atherosclerosis. 2. Lobular right apical pulmonary nodule measures 2.2 x 1.5 x 1.3 cm with minimal spiculation, concerning for primary bronchogenic neoplasm. There is no adenopathy. Other than a subcentimeter lucency in T4 which is nonspecific, no evidence of metastatic disease on this arterial phase imaging exams. This bone lucency may simply represent normal variation. Recommend PET-CT  characterization. 3. Faint reticulonodular opacity in the subpleural right lower lobe is likely infectious or inflammatory. 4. No acute abnormality in the abdomen/pelvis. Prostatomegaly is incidentally noted. Electronically Signed   By: Rubye Oaks M.D.   On: 01/01/2016 00:47   US Abdomen Limited Ruq  01/06/2016  CLINICAL DATA:  Abnormal transaminase levels.  Fever.  Hypertension. EXAM: US ABDOMEN LIMITED - RIGHT UPPER QUADRANT COMPARISON:  12/31/2015 FINDINGS: Gallbladder: Difficult to assess due to difficulty with positioning. Dependent echogenicity in the gallbladder on image 6, probably a gallstone but with only limited if any shadowing. This was not well seen on CT. Gallbladder wall thickness within normal limits. Sonographic Murphy's sign absent. Common bile duct: Diameter: 3 mm Liver: Difficulty observing parts the left lobe due to bowel gas and gastric position. Visualized liver unremarkable. IMPRESSION: 1. Suspected 8 mm gallstone, although there was poor shadowing from this dependent hyperechogenicity, and mobility was difficult to assess. It is possible that this actually represents tumefactive sludge given the lack of definite shadowing. 2. No gallbladder wall thickening or biliary dilatation. 3. Liver appears unremarkable although visualization of parts of the left lobe was problematic due to the orientation of the left lobe with respect to the stomach. Electronically Signed   By: Gaylyn Rong M.D.   On: 01/06/2016 16:43    Assessment & Plan    1.  NSTEMI/CAD:  Pt presented on transfer from [facility] on 3/11 with chest pain and ruled in.  S/P PCI/DES x 2 to the prox and mid RCA.  Post-pci course complicated by AMS, CAP, AKI, and hematuria.  From a cardiac standpoint, he continues to do well w/o chest pain or dyspnea. Cont ASA, brilinta,  blocker, arb, and high potency statin.  2.  CAP/COPD:  IM following - appreciate assistance.  Remains afebrile and on zosyn  will ask for recs re: outpt  meds @ d/c.  3.  Altered mental status/Acute encephalopathy:  Appears to have resolved.  Appreciate neuro eval.  4.  HL:  Cont high potency statin therapy.  LFT's wnl.  Will need f/u lipids/lft's in 6-8 wks.  5.  Hypernatremia:  Resolved.  6.  Hypokalemia:  Supplementation ordered.  7.  Essential HTN:  Stable.  8.  Hematuria:  Ongoing in setting of possible foley trauma and asa/brilinta.  Foley replaced this AM 2/2 urinary retention.  Urology to see today.  9.  RUL Nodule:  CT 3/10 revealed Lobular right apical pulmonary nodule measures 2.2 x 1.5 x 1.3 cm with minimal spiculation, concerning for primary bronchogenic neoplasm.  Will need outpt PET scan.  10.  AKI:  Resolved.  11.  Deconditioning:  Awaiting tx to [facility] once cleared.  SW involved.   Signed, Nicolasa Ducking NP

## 2016-01-10 NOTE — Progress Notes (Signed)
Late entry - per nursing report 3/19 pm, pt foley cath d/c'd and pt voided x 2.  Pt had no void at 2300 last pm, bladder scanned ~ 1000 ml.  MD notified with orders to replace foley.  Foley placed with hematuria urine return.  Pt tolerated procedure well.  Per nursing and family at bedside report, pt had hematuria with prior foley catheter.  Will continue to monitor pt closely.

## 2016-01-10 NOTE — Clinical Social Work Note (Signed)
Patient's PASRR number is received: [id number] A.  Roddie McBryant Warren Lindahl MSW, Lindstrom[place]CSW, [place], [phone]

## 2016-01-10 NOTE — Consult Note (Signed)
Subjective: CC: Bloody urine.  History:  I was asked to see Mr. Lee Chang in consultation by Dr. Elease HashimotoNahser for hematuria.   The patient is a 75 yo WM who was admitted last week for a NSTEMI and had CAD with stents.  He is now on Brilinta.   He was unable to void and had a foley placed over the weekend and then failed a voiding trial.   The foley is in placed and he has had gross hematuria but it appears to have now cleared.   He is confused and unable to respond cogently but per his daughter he wasn't having bleeding prior to admission although the patient wasn't clear on that.    He has been seen by Dr. Vernie Ammonsttelin in the past for a history of BPH with retention.  He is currently on finasteride 5mg  daily and tamsulosin 0.8mg  daily.   He has a history of an elevated PSA with negative biopsy x at least 2 by Dr. Jerre SimonJavaid in 1999 and 2000.   He had a CT angio of the abdomen and pelvis and has a renal cyst on the left and a large prostate.   No obvious bladder lesions or obstruction was noted.   ROS:  Review of Systems  Unable to perform ROS: mental acuity    Allergies  Allergen Reactions  . Asa [Aspirin]     Bleeding ulcer    Past Medical History  Diagnosis Date  . Ulcer   . Asthma   . Essential hypertension   . NSTEMI (non-ST elevated myocardial infarction) (HCC) 12/31/2015    Echo 01/01/16 (pre-CATH-PCI): EF 60-65%, no RWMA.   . 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 Moderate OM1 35% & midLAD 45%  . 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 Dyslipidemia, goal LDL below 70 01/02/2016  . COPD (chronic obstructive pulmonary disease) (HCC) 01/01/2016  . Lung nodule 01/01/2016  . Dementia   . BPH (benign prostatic hypertrophy) with  urinary obstruction   . Acute urinary retention     Past Surgical History  Procedure Laterality Date  . Hernia repair    . Cardiac catheterization N/A 01/01/2016    Procedure: Left Heart Cath and Coronary Angiography;  Surgeon: Marykay Lexavid W Harding, MD;  [facility];  Service: Cardiovascular;  Laterality: N/A;  . Cardiac catheterization Right 01/01/2016    Procedure: Coronary Stent Intervention;  Surgeon: Marykay Lexavid W Harding, MD;  [facility];  Service: Cardiovascular;  Laterality: Right;    Social History   Social History  . Marital Status: Single    Spouse Name: N/A  . Number of Children: N/A  . Years of Education: N/A   Occupational History  . Not on file.   Social History Main Topics  . Smoking status: Never Smoker   . Smokeless tobacco: Not on file  . Alcohol Use: No  . Drug Use: Not on file  . Sexual Activity: Not on file   Other Topics Concern  . Not on file   Social History Narrative    History reviewed. No pertinent family history.  Anti-infectives: Anti-infectives    Start     Dose/Rate Route Frequency Ordered Stop  01/05/16 1000  piperacillin-tazobactam (ZOSYN) IVPB 3.375 g     3.375 g 12.5 mL/hr over 240 Minutes Intravenous 3 times per day 01/05/16 0929     01/02/16 0300  cefTRIAXone (ROCEPHIN) 1 g in dextrose 5 % 50 mL IVPB  Status:  Discontinued     1 g 100 mL/hr over 30 Minutes Intravenous Every 24 hours 01/01/16 0759 01/05/16 0905   01/02/16 0200  azithromycin (ZITHROMAX) 500 mg in dextrose 5 % 250 mL IVPB  Status:  Discontinued     500 mg 250 mL/hr over 60 Minutes Intravenous Every 24 hours 01/01/16 0255 01/07/16 1739   01/01/16 0115  cefTRIAXone (ROCEPHIN) 1 g in dextrose 5 % 50 mL IVPB     1 g 100 mL/hr over 30 Minutes Intravenous  Once 01/01/16 0104 01/01/16 0408   01/01/16 0115  azithromycin (ZITHROMAX) 500 mg in dextrose 5 % 250 mL IVPB     500 mg 250 mL/hr over 60 Minutes Intravenous  Once 01/01/16 0104 01/01/16  0235      Current Facility-Administered Medications  Medication Dose Route Frequency Provider Last Rate Last Dose  . acetaminophen (TYLENOL) tablet 650 mg  650 mg Oral Q6H PRN Meredeth Ide, MD   650 mg at 01/05/16 1334   Or  . acetaminophen (TYLENOL) suppository 650 mg  650 mg Rectal Q6H PRN Meredeth Ide, MD      . albuterol (PROVENTIL) (2.5 MG/3ML) 0.083% nebulizer solution 2.5 mg  2.5 mg Nebulization Q2H PRN Meredeth Ide, MD      . alum & mag hydroxide-simeth (MAALOX/MYLANTA) 200-200-20 MG/5ML suspension 30 mL  30 mL Oral Q6H PRN Belkys A Regalado, MD   30 mL at 01/09/16 1701  . aspirin EC tablet 81 mg  81 mg Oral Daily Runell Gess, MD   81 mg at 01/10/16 1045  . atorvastatin (LIPITOR) tablet 80 mg  80 mg Oral q1800 Erick Blinks, MD   80 mg at 01/09/16 1753  . carvedilol (COREG) tablet 12.5 mg  12.5 mg Oral BID WC Chilton Si, MD      . dextrose 5 % solution   Intravenous Continuous Belkys A Regalado, MD 100 mL/hr at 01/10/16 0201    . feeding supplement (ENSURE ENLIVE) (ENSURE ENLIVE) liquid 237 mL  237 mL Oral BID BM Vesta Mixer, MD   237 mL at 01/09/16 1003  . finasteride (PROSCAR) tablet 5 mg  5 mg Oral Daily Meredeth Ide, MD   5 mg at 01/10/16 1045  . food thickener (THICK IT) powder   Oral PRN Vesta Mixer, MD      . guaiFENesin [facility]) 12 hr tablet 600 mg  600 mg Oral BID Meredeth Ide, MD   600 mg at 01/09/16 2307  . hydrALAZINE (APRESOLINE) injection 10 mg  10 mg Intravenous Q6H PRN Leeann Must, MD   10 mg at 01/09/16 0001  . ipratropium-albuterol (DUONEB) 0.5-2.5 (3) MG/3ML nebulizer solution 3 mL  3 mL Nebulization PRN Vesta Mixer, MD      . losartan (COZAAR) tablet 100 mg  100 mg Oral Daily Lewayne Bunting, MD   100 mg at 01/10/16 1044  . morphine 2 MG/ML injection 2 mg  2 mg Intravenous Q1H PRN Marykay Lex, MD      . nitroGLYCERIN (NITROSTAT) SL tablet 0.4 mg  0.4 mg Sublingual Q5 min PRN Vesta Mixer, MD   0.4 mg at 01/01/16 2023  .  piperacillin-tazobactam (ZOSYN) IVPB  3.375 g  3.375 g Intravenous 3 times per day Earnie Larsson, RPH   3.375 g at 01/10/16 1045  . potassium chloride SA (K-DUR,KLOR-CON) CR tablet 40 mEq  40 mEq Oral BID Belkys A Regalado, MD   40 mEq at 01/10/16 1044  . QUEtiapine (SEROQUEL) tablet 25 mg  25 mg Oral q1800 Ram Daniel Nones, MD   25 mg at 01/09/16 1753  . sodium chloride flush (NS) 0.9 % injection 3 mL  3 mL Intravenous Q12H Meredeth Ide, MD   3 mL at 01/08/16 1035  . sodium chloride flush (NS) 0.9 % injection 3 mL  3 mL Intravenous Q12H Joellyn Rued, MD   3 mL at 01/09/16 2308  . tamsulosin (FLOMAX) capsule 0.8 mg  0.8 mg Oral Daily Meredeth Ide, MD   0.8 mg at 01/10/16 1045  . ticagrelor (BRILINTA) tablet 90 mg  90 mg Oral BID Marykay Lex, MD   90 mg at 01/10/16 1045     Objective: Vital signs in last 24 hours: Temp:  [98.2 F (36.8 C)-98.7 F (37.1 C)] 98.7 F (37.1 C) (03/20 1200) Pulse Rate:  [52-67] 65 (03/20 1200) Resp:  [16-22] 18 (03/20 1200) BP: (143-176)/(67-93) 172/67 mmHg (03/20 1200) SpO2:  [96 %-98 %] 97 % (03/20 1200) Weight:  [86.274 kg (190 lb 3.2 oz)] 86.274 kg (190 lb 3.2 oz) (03/20 0500)  Intake/Output from previous day: 03/19 0701 - 03/20 0700 In: 1631 [P.O.:580; I.V.:1001; IV Piggyback:50] Out: 1575 [Urine:1575] Intake/Output this shift:     Physical Exam  Constitutional: He is well-developed, well-nourished, and in no distress.  HENT:  Head: Normocephalic and atraumatic.  Neck: Normal range of motion. Neck supple.  Cardiovascular: Normal rate and regular rhythm.   Pulmonary/Chest: Effort normal and breath sounds normal. No respiratory distress.  Abdominal: Soft. Bowel sounds are normal. He exhibits no mass. There is no tenderness. There is no guarding.  Genitourinary:  Foley is draining clear urine at this time.  Rectal deferred.  Musculoskeletal: Normal range of motion. He exhibits no edema or tenderness.  Neurological:  He is  confused and unable to provide a history.   Skin: Skin is warm and dry.    Lab Results:   Recent Labs  01/08/16 1516 01/10/16 1030  HGB 13.4 13.5  HCT 42.7 41.2   BMET  Recent Labs  01/09/16 0726 01/10/16 0609  NA 142 142  K 3.2* 3.2*  CL 105 108  CO2 23 24  GLUCOSE 142* 96  BUN 17 15  CREATININE 1.03 1.09  CALCIUM 9.2 8.7*   PT/INR No results for input(s): LABPROT, INR in the last 72 hours. ABG No results for input(s): PHART, HCO3 in the last 72 hours.  Invalid input(s): PCO2, PO2  Studies/Results: No results found.  I have reviewed his labs, UA's and CT films as well as office notes from Dr. Vernie Ammons.  There is no evidence of infection on the UA.   Assessment: BPH with retention and hematuria that is probably secondary to his large prostate and the foley.  I would recommend leaving the foley in place and having him return to our office in a week if he is able for a voiding trial. He may need cystoscopy but with the urine clearing, it is not urgent.     CC: Dr. Delane Ginger.      Everest Brod J 01/10/2016 [phone]

## 2016-01-11 ENCOUNTER — Inpatient Hospital Stay
Admission: RE | Admit: 2016-01-11 | Discharge: 2016-01-28 | Disposition: A | Discharge status: Other Acute Inpatient Hospital | Payer: Medicare Other | Source: Ambulatory Visit | Attending: Internal Medicine | Admitting: Internal Medicine

## 2016-01-11 DIAGNOSIS — G9340 Encephalopathy, unspecified: Secondary | ICD-10-CM | POA: Diagnosis not present

## 2016-01-11 DIAGNOSIS — F0391 Unspecified dementia with behavioral disturbance: Secondary | ICD-10-CM | POA: Diagnosis not present

## 2016-01-11 DIAGNOSIS — I4510 Unspecified right bundle-branch block: Secondary | ICD-10-CM | POA: Diagnosis not present

## 2016-01-11 DIAGNOSIS — J410 Simple chronic bronchitis: Secondary | ICD-10-CM | POA: Diagnosis not present

## 2016-01-11 DIAGNOSIS — N3289 Other specified disorders of bladder: Secondary | ICD-10-CM | POA: Diagnosis not present

## 2016-01-11 DIAGNOSIS — Z7982 Long term (current) use of aspirin: Secondary | ICD-10-CM | POA: Diagnosis not present

## 2016-01-11 DIAGNOSIS — R310 Gross hematuria: Secondary | ICD-10-CM | POA: Diagnosis not present

## 2016-01-11 DIAGNOSIS — R001 Bradycardia, unspecified: Secondary | ICD-10-CM | POA: Diagnosis not present

## 2016-01-11 DIAGNOSIS — N179 Acute kidney failure, unspecified: Secondary | ICD-10-CM | POA: Diagnosis not present

## 2016-01-11 DIAGNOSIS — R410 Disorientation, unspecified: Secondary | ICD-10-CM | POA: Diagnosis not present

## 2016-01-11 DIAGNOSIS — Z87891 Personal history of nicotine dependence: Secondary | ICD-10-CM | POA: Diagnosis not present

## 2016-01-11 DIAGNOSIS — D500 Iron deficiency anemia secondary to blood loss (chronic): Secondary | ICD-10-CM | POA: Diagnosis not present

## 2016-01-11 DIAGNOSIS — J449 Chronic obstructive pulmonary disease, unspecified: Secondary | ICD-10-CM | POA: Diagnosis not present

## 2016-01-11 DIAGNOSIS — G253 Myoclonus: Secondary | ICD-10-CM | POA: Diagnosis not present

## 2016-01-11 DIAGNOSIS — Z955 Presence of coronary angioplasty implant and graft: Secondary | ICD-10-CM | POA: Diagnosis not present

## 2016-01-11 DIAGNOSIS — Z7902 Long term (current) use of antithrombotics/antiplatelets: Secondary | ICD-10-CM | POA: Diagnosis not present

## 2016-01-11 DIAGNOSIS — Z833 Family history of diabetes mellitus: Secondary | ICD-10-CM | POA: Diagnosis not present

## 2016-01-11 DIAGNOSIS — R0789 Other chest pain: Secondary | ICD-10-CM | POA: Diagnosis not present

## 2016-01-11 DIAGNOSIS — I10 Essential (primary) hypertension: Secondary | ICD-10-CM | POA: Diagnosis not present

## 2016-01-11 DIAGNOSIS — R911 Solitary pulmonary nodule: Secondary | ICD-10-CM | POA: Diagnosis not present

## 2016-01-11 DIAGNOSIS — R079 Chest pain, unspecified: Secondary | ICD-10-CM | POA: Diagnosis not present

## 2016-01-11 DIAGNOSIS — M6281 Muscle weakness (generalized): Secondary | ICD-10-CM | POA: Diagnosis not present

## 2016-01-11 DIAGNOSIS — Z66 Do not resuscitate: Secondary | ICD-10-CM | POA: Diagnosis not present

## 2016-01-11 DIAGNOSIS — R269 Unspecified abnormalities of gait and mobility: Secondary | ICD-10-CM | POA: Diagnosis not present

## 2016-01-11 DIAGNOSIS — E785 Hyperlipidemia, unspecified: Secondary | ICD-10-CM | POA: Diagnosis not present

## 2016-01-11 DIAGNOSIS — Z9889 Other specified postprocedural states: Secondary | ICD-10-CM | POA: Diagnosis not present

## 2016-01-11 DIAGNOSIS — R278 Other lack of coordination: Secondary | ICD-10-CM | POA: Diagnosis not present

## 2016-01-11 DIAGNOSIS — D649 Anemia, unspecified: Secondary | ICD-10-CM | POA: Diagnosis not present

## 2016-01-11 DIAGNOSIS — R338 Other retention of urine: Secondary | ICD-10-CM | POA: Diagnosis not present

## 2016-01-11 DIAGNOSIS — N390 Urinary tract infection, site not specified: Secondary | ICD-10-CM | POA: Diagnosis not present

## 2016-01-11 DIAGNOSIS — I2510 Atherosclerotic heart disease of native coronary artery without angina pectoris: Secondary | ICD-10-CM | POA: Diagnosis not present

## 2016-01-11 DIAGNOSIS — D62 Acute posthemorrhagic anemia: Secondary | ICD-10-CM | POA: Diagnosis not present

## 2016-01-11 DIAGNOSIS — N170 Acute kidney failure with tubular necrosis: Secondary | ICD-10-CM | POA: Diagnosis not present

## 2016-01-11 DIAGNOSIS — R339 Retention of urine, unspecified: Secondary | ICD-10-CM | POA: Diagnosis not present

## 2016-01-11 DIAGNOSIS — N289 Disorder of kidney and ureter, unspecified: Secondary | ICD-10-CM | POA: Diagnosis not present

## 2016-01-11 DIAGNOSIS — I214 Non-ST elevation (NSTEMI) myocardial infarction: Secondary | ICD-10-CM | POA: Diagnosis not present

## 2016-01-11 DIAGNOSIS — R2232 Localized swelling, mass and lump, left upper limb: Secondary | ICD-10-CM | POA: Diagnosis not present

## 2016-01-11 DIAGNOSIS — F0390 Unspecified dementia without behavioral disturbance: Secondary | ICD-10-CM | POA: Diagnosis not present

## 2016-01-11 DIAGNOSIS — I252 Old myocardial infarction: Secondary | ICD-10-CM | POA: Diagnosis not present

## 2016-01-11 DIAGNOSIS — N401 Enlarged prostate with lower urinary tract symptoms: Secondary | ICD-10-CM | POA: Diagnosis not present

## 2016-01-11 DIAGNOSIS — R5383 Other fatigue: Secondary | ICD-10-CM | POA: Diagnosis not present

## 2016-01-11 DIAGNOSIS — E876 Hypokalemia: Secondary | ICD-10-CM | POA: Diagnosis not present

## 2016-01-11 DIAGNOSIS — G467 Other lacunar syndromes: Secondary | ICD-10-CM | POA: Diagnosis not present

## 2016-01-11 DIAGNOSIS — N400 Enlarged prostate without lower urinary tract symptoms: Secondary | ICD-10-CM | POA: Diagnosis not present

## 2016-01-11 DIAGNOSIS — R319 Hematuria, unspecified: Secondary | ICD-10-CM | POA: Diagnosis not present

## 2016-01-11 DIAGNOSIS — J189 Pneumonia, unspecified organism: Secondary | ICD-10-CM | POA: Diagnosis not present

## 2016-01-11 DIAGNOSIS — J45909 Unspecified asthma, uncomplicated: Secondary | ICD-10-CM | POA: Diagnosis not present

## 2016-01-11 DIAGNOSIS — E43 Unspecified severe protein-calorie malnutrition: Secondary | ICD-10-CM | POA: Diagnosis not present

## 2016-01-11 DIAGNOSIS — I472 Ventricular tachycardia: Secondary | ICD-10-CM | POA: Diagnosis not present

## 2016-01-11 DIAGNOSIS — N138 Other obstructive and reflux uropathy: Secondary | ICD-10-CM | POA: Diagnosis not present

## 2016-01-11 DIAGNOSIS — I517 Cardiomegaly: Secondary | ICD-10-CM | POA: Diagnosis not present

## 2016-01-11 LAB — BASIC METABOLIC PANEL
Anion gap: 10 (ref 5–15)
BUN: 18 mg/dL (ref 6–20)
CHLORIDE: 109 mmol/L (ref 101–111)
CO2: 22 mmol/L (ref 22–32)
Calcium: 8.8 mg/dL — ABNORMAL LOW (ref 8.9–10.3)
Creatinine, Ser: 1.13 mg/dL (ref 0.61–1.24)
GFR calc Af Amer: >60 mL/min (ref >60)
GLUCOSE: 133 mg/dL — AB (ref 65–99)
Potassium: 3.3 mmol/L — ABNORMAL LOW (ref 3.5–5.1)
Sodium: 141 mmol/L (ref 135–145)

## 2016-01-11 MED ORDER — ALBUTEROL SULFATE (2.5 MG/3ML) 0.083% IN NEBU: 2.5000 mg | INHALATION_SOLUTION | RESPIRATORY_TRACT | Status: AC | PRN

## 2016-01-11 MED ORDER — NITROGLYCERIN 0.4 MG SL SUBL: 0.4000 mg | SUBLINGUAL_TABLET | SUBLINGUAL | Status: AC | PRN

## 2016-01-11 MED ORDER — CARVEDILOL 12.5 MG PO TABS: 12.5000 mg | ORAL_TABLET | Freq: Two times a day (BID) | ORAL | Status: AC

## 2016-01-11 MED ORDER — AMOXICILLIN-POT CLAVULANATE 875-125 MG PO TABS
1.0000 | ORAL_TABLET | Freq: Two times a day (BID) | ORAL | Status: DC
Start: 2016-01-11 — End: 2016-01-13

## 2016-01-11 MED ORDER — LOSARTAN POTASSIUM 100 MG PO TABS: 100.0000 mg | ORAL_TABLET | Freq: Every day | ORAL | Status: DC

## 2016-01-11 MED ORDER — TICAGRELOR 90 MG PO TABS: 90.0000 mg | ORAL_TABLET | Freq: Two times a day (BID) | ORAL | Status: DC

## 2016-01-11 MED ORDER — ALUM & MAG HYDROXIDE-SIMETH 200-200-20 MG/5ML PO SUSP: 30.0000 mL | Freq: Four times a day (QID) | ORAL | Status: AC | PRN

## 2016-01-11 MED ORDER — GUAIFENESIN ER 600 MG PO TB12: 600.0000 mg | ORAL_TABLET | Freq: Two times a day (BID) | ORAL | Status: DC

## 2016-01-11 MED ORDER — ENSURE ENLIVE PO LIQD: 237.0000 mL | Freq: Two times a day (BID) | ORAL | Status: DC

## 2016-01-11 MED ORDER — QUETIAPINE FUMARATE 25 MG PO TABS: 25.0000 mg | ORAL_TABLET | Freq: Every day | ORAL | Status: DC

## 2016-01-11 MED ORDER — ATORVASTATIN CALCIUM 80 MG PO TABS: 80.0000 mg | ORAL_TABLET | Freq: Every day | ORAL | Status: AC

## 2016-01-11 MED ORDER — ASPIRIN 81 MG PO TBEC: 81.0000 mg | DELAYED_RELEASE_TABLET | Freq: Every day | ORAL | Status: AC

## 2016-01-11 NOTE — Progress Notes (Signed)
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  Currently being bathed.  He says that staff is starving him.  Oriented to person and place.  No c/p or dyspnea.  Would like to be d/c'd.    Inpatient Medications    . aspirin EC  81 mg Oral Daily  . atorvastatin  80 mg Oral q1800  . carvedilol  12.5 mg Oral BID WC  . feeding supplement (ENSURE ENLIVE)  237 mL Oral BID BM  . finasteride  5 mg Oral Daily  . guaiFENesin  600 mg Oral BID  . losartan  100 mg Oral Daily  . QUEtiapine  25 mg Oral q1800  . sodium chloride flush  3 mL Intravenous Q12H  . sodium chloride flush  3 mL Intravenous Q12H  . tamsulosin  0.8 mg Oral Daily  . ticagrelor  90 mg Oral BID    Vital Signs    Filed Vitals:   01/10/16 2246 01/11/16 0012 01/11/16 0448 01/11/16 0806  BP: 104/52 132/71 144/64 149/64  Pulse:  62 66 65  Temp:  98.6 F (37 C) 98.8 F (37.1 C) 98.7 F (37.1 C)  TempSrc:  Axillary Oral Axillary  Resp:  20 18 18   Height:      Weight:   194 lb 1.6 oz (88.043 kg)   SpO2: 96% 95% 97% 97%    Intake/Output Summary (Last 24 hours) at 01/11/16 1152 Last data filed at 01/11/16 0921  Gross per 24 hour  Intake 2868.33 ml  Output    600 ml  Net 2268.33 ml   Filed Weights   01/09/16 0500 01/10/16 0500 01/11/16 0448  Weight: 200 lb 12.8 oz (91.082 kg)  190 lb 3.2 oz (86.274 kg) 194 lb 1.6 oz (88.043 kg)    Physical Exam    General: Pleasant, NAD. Neuro: Alert and oriented to person/place. Moves all extremities spontaneously/follows commands. Psych: Flat affect. HEENT:  Normal  Neck: Supple without bruits or JVD. Lungs:  Resp regular and unlabored, relatively CTA - mildly diminished @ right base. Heart: RRR no s3, s4, or murmurs. Abdomen: Soft, non-tender, non-distended, BS + x 4.  Extremities: No clubbing, cyanosis or edema. DP/PT/Radials 2+ and equal bilaterally.  R radial cath site w/o bleeding/bruit/hematoma. bilat upper ext fairly ecchymotic to elbows.  Labs    CBC  Recent Labs  01/08/16 1516 01/10/16 1030  HGB 13.4 13.5  HCT 42.7 41.2   Basic Metabolic Panel  Recent Labs  01/09/16 0726 01/10/16 0609  NA 142 142  K 3.2* 3.2*  CL 105 108  CO2 23 24  GLUCOSE 142* 96  BUN 17 15  CREATININE 1.03 1.09  CALCIUM 9.2 8.7*    Telemetry    RSR  Radiology    Ct Head Wo Contrast  01/10/2016  CLINICAL DATA:  Confusion EXAM: CT HEAD WITHOUT CONTRAST TECHNIQUE: Contiguous axial images were obtained from the base of the skull through the vertex without intravenous contrast. COMPARISON:  01/03/2016 FINDINGS: The bony calvarium is intact. No gross soft tissue abnormality is noted. Mild atrophic changes are seen. A small lacunar infarct is noted in the left basal ganglia posterior laterally. No acute hemorrhage, acute infarction or space-occupying mass lesion is noted. IMPRESSION: No acute abnormality noted. Electronically Signed   By: Alcide Clever M.D.   On: 01/10/2016 16:11    Assessment & Plan    1. NSTEMI/CAD: Pt presented on transfer from [facility] on 3/11 with chest pain and ruled in. S/P PCI/DES x 2 to the prox and mid RCA. Post-pci course complicated by AMS, CAP, AKI, and hematuria. From a cardiac standpoint, he continues to do well w/o chest pain or dyspnea. Cont ASA, brilinta,  blocker, arb, and high potency  statin.  Ready for d/c to [facility] today.  2. CAP/COPD: IM following - appreciate assistance. Remains afebrile.  Switched to augmentin with plan for 2 days of therapy.  3. Altered mental status/Acute encephalopathy: Intermittent confusion - waxes and wanes.  F/U Head CT 3/20 showed no acute abnormality.  Prev seen by neuro.  4. HL: Cont high potency statin therapy. LFT's wnl. Will need f/u lipids/lft's in 6-8 wks.  5. Hypernatremia: Resolved.  6. Hypokalemia: Supplemented 3/20 - f/u this AM prior to d/c.  7. Essential HTN: Coreg titrated yesterday.  BP trending better - dipped to 104 last night.  Cont current dose.  8. Hematuria: Seen by urology 3/20  rec foley @ d/c and outpt f/u.  Appreciate assistance.  9. RUL Nodule: CT 3/10 revealed Lobular right apical pulmonary nodule measures 2.2 x 1.5 x 1.3 cm with minimal spiculation, concerning for primary bronchogenic neoplasm. Will need outpt PET scan.  10. AKI: Resolved.  11. Deconditioning: D/c to [facility] today.  Signed, Nicolasa Ducking NP

## 2016-01-11 NOTE — Clinical Social Work Placement (Signed)
   CLINICAL SOCIAL WORK PLACEMENT  NOTE  Date:  01/11/2016  Patient Details  Name: Lee Chang MRN: [id number] Date of Birth: [birth year]  Clinical Social Work is seeking post-discharge placement for this patient at the Skilled  Nursing Facility level of care (*CSW will initial, date and re-position this form in  chart as items are completed):  Yes   Patient/family provided with [facility] Clinical Social Work Department's list of facilities offering this level of care within the geographic area requested by the patient (or if unable, by the patient's family).  Yes   Patient/family informed of their freedom to choose among providers that offer the needed level of care, that participate in Medicare, Medicaid or managed care program needed by the patient, have an available bed and are willing to accept the patient.  Yes   Patient/family informed of [facility]'s ownership interest in [facility] and [facility], as well as of the fact that they are under no obligation to receive care at these facilities.  PASRR submitted to EDS on       PASRR number received on       Existing PASRR number confirmed on 01/10/16     FL2 transmitted to all facilities in geographic area requested by pt/family on 01/10/16     FL2 transmitted to all facilities within larger geographic area on       Patient informed that his/her managed care company has contracts with or will negotiate with certain facilities, including the following:        Yes   Patient/family informed of bed offers received.  [facility]     Physician recommends and patient chooses bed at      Patient to be [facility] to [facility] on 01/11/16.  Patient to be transferred to facility by [facility]     Patient family notified on 01/11/16 of transfer.  Name of family member notified:  Daughter     PHYSICIAN       Additional Comment:   Per MD, patient ready to [facility]. RN, patient/family, and facility notified of patient's discharge. RN given number for report. DC packet on chart. Ambulance transport requested. Family has completed necessary paperwork. BSW intern signing off.   _______________________________________________ Jenita SeashoreMaggie Davin Archuletta, Student-SW 01/11/2016, 2:27 PM

## 2016-01-11 NOTE — Care Management Note (Signed)
Case Management Note  Patient Details  Name: Lee Chang MRN: [id number] Date of Birth: 12-26-1940  Subjective/Objective: Pt admitted for Nstemi. Plan for SNF 01-11-16.                   Action/Plan:CSW assisting with disposition needs. No further needs from CM at this time.   Expected Discharge Date:                  Expected Discharge Plan:  Home w Home Health Services  In-House Referral:     Discharge planning Services  CM Consult, Medication Assistance  Post Acute Care Choice:  NA Choice offered to:  NA  DME Arranged:  N/A DME Agency:  NA  HH Arranged:  NA HH Agency:  NA  Status of Service:  Completed, signed off  Medicare Important Message Given:  Yes Date Medicare IM Given:    Medicare IM give by:    Date Additional Medicare IM Given:    Additional Medicare Important Message give by:     If discussed at Long Length of Stay Meetings, dates discussed:    Additional Comments:  Gala LewandowskyGraves-Bigelow, Kemper Heupel Kaye, RN 01/11/2016, 11:08 AM

## 2016-01-11 NOTE — Progress Notes (Signed)
Pt continues to lack the ability to ambulate in hallway with PT therefore n/a for CR. Ed completed with daughter (pt sleeping soundly). Discussed MI, stent, Brilinta, NTG, and eventual CRPII. Not clear if pts mental capacity will improve for CRPII therefore will not refer at this time per daughters request. I gave her the brochure and she will call [facility] CRP[facility] if he improves. [id number] Lee ChickKristan Anasha Chang CES, ACSM 1:32 PM 01/11/2016

## 2016-01-11 NOTE — Progress Notes (Signed)
Speech Language Pathology Treatment: Dysphagia  Patient Details Name: Lee Chang MRN: [id number] DOB: 12-May-1941 Today's Date: 01/11/2016 Time: [id number] SLP Time Calculation (min) (ACUTE ONLY): 21 min  Assessment / Plan / Recommendation Clinical Impression  Pt demonstrates ongoing oral dysphagia with prolonged mastication and mild oral residuals. SLP utilized min tactile cues to encourage liquid wash with nectar to facilitate oral transit. Pt did have intermittent slight wet vocal quality and delayed cough x1, suspect premature spillage or spillage or oral residuals post swallow. SLP provided verbal cues for swallow trigger or intermittent throat clear x3.  Pt was alert today, but still sluggish initiially. Recommend continuing current diet and precautions into d/c. Pt will likely need modified diet until mentation significantly improves in the future. Recommend f/u SLP therapy at next level of care.    HPI HPI: 75 y.o. male with a Past Medical History of peptic ulcer disease, COPD, and NSTEMI, right lung nodule, HLD, dementia who presented on 01/01/2015 with chest pain and pneumonia. Patient underwent cardiac catheterization with placement of DES on 01/02/2016.       SLP Plan  Continue with current plan of care     Recommendations  Diet recommendations: Dysphagia 3 (mechanical soft);Nectar-thick liquid Liquids provided via: Cup;No straw Medication Administration: Crushed with puree Supervision: Staff to assist with self feeding;Full supervision/cueing for compensatory strategies Compensations: Minimize environmental distractions;Small sips/bites;Slow rate Postural Changes and/or Swallow Maneuvers: Seated upright 90 degrees             Plan: Continue with current plan of care     GO               [facility]agleville HospitalBonnie Harmani Neto, MA CCC-SLP [phone]  Claudine MoutonDeBlois, Elery Cadenhead Caroline 01/11/2016, 11:12 AM

## 2016-01-11 NOTE — Progress Notes (Addendum)
PROGRESS NOTE  [name]:[id number] DOB: [birth year] DOA: 12/31/2015 PCP: [name], MD  Assessment/Plan: Subjective; Alert, denies dyspnea. No further diarrhea.  I have completed Med rec.   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 Delirium,.Confusion, tremors.  CT head no acute findings, EEG nonspecific likely multifactorial, including delirium, dehydration, metabolic abnormalities, fever.  Neurology following, exelon stopped on 3/13 (per daughter patient was started on exelon in 11/2015), neurology started seroquel on 3/13will defer neurology for meds adjustment. Improving.   Hematuria:  UA with no significant WBC. Suspect traumatic in setting of blood thinner.  Monitor hb.  HB stable.  discontinue heparin.  Urology recommend discharge with foley catheter, follow in the office in 1 week.    Hypernatremia; continue with IV fluids to D 5.  Repeat labs in am.  resolved  Diarrhea; after laxative. Monitor off laxative. If persist might need to check for C diff.   Fever, Leukocytosis. Presume secondary to PNA Fever trending down. WBC trending down.   Change to Zosyn 3-15 , day 7. He will be discharge on 2 more days of antibiotics.  Repeat Chest x ray/ no acute finding.  [place] negative for cholecystitis.   AKI;  Improved with fluids. Monitor on cozaar.  Might be multifactorial.    Mildly elevated ammonia level, started on lactulose.    Ammonia level normalized.   COPD exacerbation/CAP: continue with nebulizer, received prednisone taper. If wheezing consider prednisone taper.   CAD/HTN, NSTEMI S/p R cardiac catheterization due to thromboemboli mid-distal rPDA (Pt had 18 hrs Aggrastat IV) with infero- apical wall motion abnormality. 2 D Echo Nl LVF, EF of 60-65% EKG w/o acute changes.  Plan per cardiology  Left lung nodule Ct chest 3/11 showed Lobular right apical pulmonary nodule measures 2.2 x 1.5 x 1.3 cm with minimal spiculation, concerning for primary bronchogenic neoplasm. There is no adenopathy. Other than a subcentimeter lucency in T4 which is nonspecific, no evidence of metastatic disease For PET as OP.   Left arm swelling with ecchymoses. These findings are new, could be due to recent anticoagulation during cath LUE [place] no DVT   Dementia Per family patient was started on aricept, then exelon this year Diet modified per  swallow evaluation, on aspiration precaution   Code Status: DNR,   Family Communication: patient and son who was in the room.   Disposition Plan: ok to discharge to SNF today.    Consultants:  Cardiology primary  [facility] consultant for altered mental status, leukocytosis  Neurology consultant for confusion  Procedures:  Cardiac cath on 3/11  EEG   Antibiotics:  Rocephin/zithro from admission---change to Zosyn 3-15   Objective: BP 149/64 mmHg  Pulse 65  Temp(Src) 98.7 F (37.1 C) (Axillary)  Resp 18  Ht  (1.727 m)  Wt 88.043 kg (194 lb 1.6 oz)  BMI 29.52 kg/m2  SpO2 97%  Intake/Output Summary (Last 24 hours) at 01/11/16 8119 Last data  filed at 01/10/16 2030  Gross per 24 hour  Intake 2748.33 ml  Output    600 ml  Net 2148.33 ml   Filed Weights   01/09/16 0500 01/10/16 0500 01/11/16 0448  Weight: 91.082 kg (200 lb 12.8 oz) 86.274 kg (190 lb 3.2 oz) 88.043 kg (194 lb 1.6 oz)    Exam:   General:  Alert in no distress  Cardiovascular:  RRR  Respiratory: Bilateral Wheezing.   Abdomen: Soft/ND/NT, positive BS  Musculoskeletal: No Edema    Data Reviewed: Basic Metabolic Panel:  Recent Labs Lab 01/06/16 0459 01/07/16 0530 01/08/16 0602 01/09/16 0726 01/10/16 0609  NA 147* 151* 150* 142 142  K 3.9 3.8 3.3* 3.2* 3.2*  CL 115* 117* 114* 105 108  CO2 23 27 24 23 24   GLUCOSE 126* 127* 126* 142* 96  BUN 41* 30* 20 17 15   CREATININE 1.55* 1.33* 1.20 1.03 1.09  CALCIUM 9.2 9.5 9.7 9.2 8.7*   Liver Function Tests:  Recent Labs Lab 01/05/16 0853 01/07/16 0530  AST 26 15  ALT 18 15*  ALKPHOS 49 40  BILITOT 0.9 0.7  PROT 5.9* 5.3*  ALBUMIN 3.2* 2.7*   No results for input(s): LIPASE, AMYLASE in the last 168 hours.  Recent Labs Lab 01/05/16 0320  AMMONIA 35   CBC:  Recent Labs Lab 01/05/16 0320 01/06/16 0459 01/06/16 1843 01/07/16 0530 01/08/16 1516 01/10/16 1030  WBC 17.5* 14.0*  --  11.7*  --   --   NEUTROABS  --  10.8*  --   --   --   --   HGB 13.9 11.9* 12.4* 12.0* 13.4 13.5  HCT 42.8 37.6* 39.7 38.9* 42.7 41.2  MCV 89.9 91.7  --  92.8  --   --   PLT 207 179  --  199  --   --    Cardiac Enzymes:   No results for input(s): CKTOTAL, CKMB, CKMBINDEX, TROPONINI in the last 168 hours. BNP (last 3 results)  Recent Labs  01/01/16 1840  BNP 36.1    ProBNP (last 3 results) No results for input(s): PROBNP in the last 8760 hours.  CBG:  Recent Labs Lab 01/07/16 0408 01/07/16 0726 01/08/16 0757 01/09/16 0804 01/10/16 0803  GLUCAP 160* 89 134* 141* 107*    Recent Results (from the past 240 hour(s))  Culture, blood (routine x 2)     Status: None   Collection Time: 01/03/16 12:38 AM  Result Value Ref Range Status   Specimen Description BLOOD RIGHT ARM  Final   Special Requests IN PEDIATRIC BOTTLE 3CC  Final   Culture NO GROWTH 5 DAYS  Final   Report Status 01/08/2016 FINAL  Final  Culture, blood (routine x 2)     Status: None   Collection Time: 01/03/16 12:42 AM  Result Value  Ref Range Status   Specimen Description BLOOD RIGHT ARM  Final   Special Requests IN PEDIATRIC BOTTLE 3CC  Final   Culture NO GROWTH 5 DAYS  Final   Report Status 01/08/2016 FINAL  Final  Culture, Urine     Status: None   Collection Time: 01/05/16 10:25 PM  Result Value Ref Range Status   Specimen Description URINE, CATHETERIZED  Final   Special Requests NONE  Final   Culture NO GROWTH 1 DAY  Final   Report Status 01/07/2016 FINAL  Final     Studies: Ct Head Wo Contrast  01/10/2016  CLINICAL DATA:  Confusion EXAM: CT HEAD WITHOUT CONTRAST TECHNIQUE: Contiguous  axial images were obtained from the base of the skull through the vertex without intravenous contrast. COMPARISON:  01/03/2016 FINDINGS: The bony calvarium is intact. No gross soft tissue abnormality is noted. Mild atrophic changes are seen. A small lacunar infarct is noted in the left basal ganglia posterior laterally. No acute hemorrhage, acute infarction or space-occupying mass lesion is noted. IMPRESSION: No acute abnormality noted. Electronically Signed   By: Alcide Clever M.D.   On: 01/10/2016 16:11    Scheduled Meds: . aspirin EC  81 mg Oral Daily  . atorvastatin  80 mg Oral q1800  . carvedilol  12.5 mg Oral BID WC  . feeding supplement (ENSURE ENLIVE)  237 mL Oral BID BM  . finasteride  5 mg Oral Daily  . guaiFENesin  600 mg Oral BID  . losartan  100 mg Oral Daily  . QUEtiapine  25 mg Oral q1800  . sodium chloride flush  3 mL Intravenous Q12H  . sodium chloride flush  3 mL Intravenous Q12H  . tamsulosin  0.8 mg Oral Daily  . ticagrelor  90 mg Oral BID    Continuous Infusions: . dextrose 100 mL/hr at 01/11/16 0032     Time spent:  Hartley Barefoot A MD [facility] Pager [phone] If 7PM-7AM, please contact night-coverage at [email or web address], password [facility] 01/11/2016, 9:05 AM  LOS: 10 days

## 2016-01-11 NOTE — Discharge Instructions (Signed)
**  PLEASE REMEMBER TO BRING ALL OF YOUR MEDICATIONS TO EACH OF YOUR FOLLOW-UP OFFICE VISITS. ° °NO HEAVY LIFTING X 2 WEEKS. °NO SEXUAL ACTIVITY X 2 WEEKS. °NO DRIVING X 1 WEEK. °NO SOAKING BATHS, HOT TUBS, POOLS, ETC., X 7 DAYS. ° °Radial Site Care °Refer to this sheet in the next few weeks. These instructions provide you with information on caring for yourself after your procedure. Your caregiver may also give you more specific instructions. Your treatment has been planned according to current medical practices, but problems sometimes occur. Call your caregiver if you have any problems or questions after your procedure. °HOME CARE INSTRUCTIONS °· You may shower the day after the procedure. Remove the bandage (dressing) and gently wash the site with plain soap and water. Gently pat the site dry.  °· Do not apply powder or lotion to the site.  °· Do not submerge the affected site in water for 3 to 5 days.  °· Inspect the site at least twice daily.  °· Do not flex or bend the affected arm for 24 hours.  °· No lifting over 5 pounds (2.3 kg) for 5 days after your procedure.  ° °What to expect: °· Any bruising will usually fade within 1 to 2 weeks.  °· Blood that collects in the tissue (hematoma) may be painful to the touch. It should usually decrease in size and tenderness within 1 to 2 weeks.  °SEEK IMMEDIATE MEDICAL CARE IF: °· You have unusual pain at the radial site.  °· You have redness, warmth, swelling, or pain at the radial site.  °· You have drainage (other than a small amount of blood on the dressing).  °· You have chills.  °· You have a fever or persistent symptoms for more than 72 hours.  °· You have a fever and your symptoms suddenly get worse.  °· Your arm becomes pale, cool, tingly, or numb.  °· You have heavy bleeding from the site. Hold pressure on the site.  ° °

## 2016-01-11 NOTE — Progress Notes (Signed)
Report called [facility]ty [facility]. Pt [facility] via [facility]. Pt awaiting in [facility]. VSS.

## 2016-01-11 NOTE — Discharge Summary (Signed)
Discharge Summary    Patient ID: Lee Chang,  MRN: [id number], DOB/AGE: 1940-12-12 75 y.o.  Admit date: 12/31/2015 Discharge date: 01/11/2016  Primary Care Provider: [name] Primary Cardiologist: New - will f/u in [facility] office.  Discharge Diagnoses    Principal Problem:   NSTEMI (non-ST elevated myocardial infarction) (HCC)  **S/P PCI/DES to the RCA x 2 this admission.  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   AKI (acute kidney injury) (HCC)  **Resolved   Lung nodule  **Will need f/u PET/CT as outpt.   Bradycardia   Dyslipidemia, goal LDL below 70   Left arm swelling   Hypernatremia  **Resolved.   Hypokalemia   Allergies Allergies  Allergen Reactions  . Asa [Aspirin]     Bleeding ulcer    Diagnostic Studies/Procedures    Cardiac Catheterization and Percutaneous Coronary Intervention 3.11.2017 Coronary Findings     Dominance: Right    Left Main  . Vessel is large.      Left Anterior Descending  . Vessel is large. The vessel . Mildly tortuous   . Prox LAD to Mid LAD lesion, 45% stenosed. Diffuse eccentric.   . First Diagonal Branch   The vessel is small in size.   . First Septal Branch   The vessel is small in size.   Marland Kitchen Second Diagonal Branch   The vessel is tortuous.   . Lateral Second Diagonal Branch   The vessel is moderate in size.   Marland Kitchen Second Septal Branch   The vessel is small in size.   . Third Diagonal Branch   The vessel is small in size.      Left Circumflex   . First Obtuse Marginal Branch   The vessel is moderate in size.   . 1st Mrg lesion, 35%  stenosed. Diffuse. Somewhat irregular At the distal portion of this lesion, there is a small caliber branch of the OM that cannot be seen in the available diagram.   . Lateral First Obtuse Marginal Branch   The vessel is moderate in size.   Marland Kitchen Second Obtuse Marginal Branch   The vessel is moderate in size.   . Lateral Second Obtuse Marginal Branch   The vessel is small in size.      Right Coronary Artery  . Vessel is large. The intervening segment between the proximal lesion and the mid lesion appears to be ectatic and somewhat irregular/ulcerated. There is a very short relatively normal segment at the takeoff of the marginal branch with thrombus present   . Prox RCA lesion, 99% stenosed. Mildly Calcified diffuse with heavy thrombus eccentric ulcerative. Clearly this was the original culprit lesion for his initial event on March 10      . Mid RCA-1 lesion, 60% stenosed. Diffuse tubular eccentric.   Marland Kitchen PCI: The proximal and mid RCA were successfully stented using a 3.5 x 38 mm Synergy DES.   Marland Kitchen There is no residual stenosis post intervention.     . Mid RCA-2 lesion, 80% stenosed. Tubular eccentric.   Marland Kitchen PCI: The Mid RCA was successfully stented using a 3.0 x  28 mm Synergy DES.  Marland Kitchen There is no residual stenosis post intervention.     . Acute Marginal Branch   The vessel is moderate in size.   . Right Posterior Descending Artery   The vessel is moderate in size.   Marland Kitchen RPDA lesion, 100% stenosed. Discrete thrombotic. Likely distal thromboembolic event from the initial MI. Unable to cross with wire   . Inferior Septal   The vessel is small in size.   . First Right Posterolateral   The vessel is moderate in size.   Marland Kitchen Second Right Posterolateral   The vessel is small in size.   . Third Right Posterolateral   The vessel is small in size.    Left Ventricle The left ventricular size is normal. The left ventricular systolic function is normal. The left ventricular ejection fraction is 55-65% by  visual estimate. There are wall motion abnormalities in the left ventricle. There are segmental wall motion abnormalities in the left ventricle.   Mitral Valve There is no mitral valve regurgitation.   Aortic Valve There is no aortic valve stenosis, and no aortic valve regurgitation. _____________  2D Echocardiogram 3.11.2017  Study Conclusions   - Left ventricle: The cavity size was normal. Systolic function was   normal. The estimated ejection fraction was in the range of 60%   to 65%. Wall motion was normal; there were no regional wall   motion abnormalities. - Aortic valve: Valve area (VTI): 2.88 cm^2. Valve area (Vmax):   3.11 cm^2. Valve area (Vmean): 2.62 cm^2. _____________   History of Present Illness     76 y/o ? with a h/o HTN, HL, COPD, PUD and GIB > 10 yrs ago.  He presented to [facility] on March 10 with complaints of chest pain and abdominal discomfort. He also reported a 3 week history of productive cough with gray sputum. ECG showed nonspecific changes. CT angiogram of the chest and abdomen were performed and showed no evidence of PE or dissection. Incidental finding of a 2.2 x 1.5 x 1.3 cm right apical pulmonary nodule is noted. Follow-up PET CT was recommended. CT also showed a faint reticulonodular opacity in the subpleural right lower lobe suspicious for infection. He was seen by internal medicine and admitted to [facility] and placed on antibiotics for possible pneumonia. He eventually ruled in for non-STEMI, initially peaking his troponin at 3.19 on March 11. An echo was performed and showed normal LV function. Because of acute coronary syndrome, he was transferred to [facility] for further evaluation by cardiology.  Hospital Course     Consultants: Neurology, Social Work   Following arrival at [facility], he underwent diagnostic catheterization. This revealed severe possible and mid right coronary artery disease along with nonobstructive LAD and  obtuse marginal disease. The RCA was felt to be the culprit for his non-STEMI and this was subsequently stented with 2 drug-eluting stents. He was placed on aspirin, brilinta, statin, beta blocker, and ARB therapy post PCI and initially did well without chest pain or dyspnea. On the evening of March 12 however, he was noted to develop acute delirium. Head CT was performed and showed an old left basal ganglia lacunar infarct with involutional changes and minimal chronic small vessel ischemic changes. There were no acute intracranial processes. Neurology was consulted and felt that acute delirium was likely multifactorial. Adjustments to his medications were made including discontinuation of Exelon, which he had been on previously. Circumflex well was initiated. EEG was  performed and showed generalized slowing, which was felt to be nonspecific. There are no evidence of seizures. Agitation did steadily improve, as did delirium.  Of note, post PCI, he also had elevation of his creatinine to a peak of 1.98 on March 15. This has since returned to normal following adequate IV hydration.   Mr. Janice NorrieFrench was followed by internal medicine throughout his hospital stay. He remained on Zosyn therapy for management of presumed community acquired pneumonia and did have intermittent fevers and leukocytosis, which since resolved. He has been switched to Augmentin therapy with the plan to continue this for 2 additional days. He also developed hematuria on March 16. Urinalysis did not show any infection. As we are unable to interrupt his aspirin and brilinta therapy now, we did have him evaluated by urology. Recommendation was for continuation of Foley catheter at discharge with follow-up in the urology office in 2 weeks.  Due to ongoing delirium, which waxes and wanes in severity, along with fairly significant deconditioning, patient was evaluated by physical therapy and felt to require skilled nursing facility placement. We have  worked with social work and a bed has been obtained at the [facility] in [place]. He will be discharged today in stable condition. __________  Discharge Vitals Blood pressure 149/64, pulse 65, temperature 98.7 F (37.1 C), temperature source Axillary, resp. rate 18, height 5\' 8"  (1.727 m), weight 194 lb 1.6 oz (88.043 kg), SpO2 97 %.  Filed Weights   01/09/16 0500 01/10/16 0500 01/11/16 0448  Weight: 200 lb 12.8 oz (91.082 kg) 190 lb 3.2 oz (86.274 kg) 194 lb 1.6 oz (88.043 kg)    Labs & Radiologic Studies    CBC  Recent Labs  01/08/16 1516 01/10/16 1030  HGB 13.4 13.5  HCT 42.7 41.2   Basic Metabolic Panel  Recent Labs  01/09/16 0726 01/10/16 0609  NA 142 142  K 3.2* 3.2*  CL 105 108  CO2 23 24  GLUCOSE 142* 96  BUN 17 15  CREATININE 1.03 1.09  CALCIUM 9.2 8.7*   Liver Function Tests Lab Results  Component Value Date   ALT 15* 01/07/2016   AST 15 01/07/2016   ALKPHOS 40 01/07/2016   BILITOT 0.7 01/07/2016    Cardiac Enzymes Lab Results  Component Value Date   TROPONINI 7.83* 01/02/2016     Thyroid Function Tests Lab Results  Component Value Date   TSH 1.717 01/01/2016    Ct Head Wo Contrast  01/10/2016  CLINICAL DATA:  Confusion EXAM: CT HEAD WITHOUT CONTRAST  IMPRESSION: No acute abnormality noted. Electronically Signed   By: Alcide CleverMark  Lukens M.D.   On: 01/10/2016 16:11   Ct Head Wo Contrast  01/03/2016  CLINICAL DATA:  Delirium, altered mental status. Recent myocardial infarction, history of hypertension and dyslipidemia. EXAM: CT HEAD WITHOUT CONTRAST  IMPRESSION: No acute intracranial process. Involutional changes and minimal chronic small vessel ischemic disease. Old LEFT basal ganglia lacunar infarct. Electronically Signed   By: Awilda Metroourtnay  Bloomer M.D.   On: 01/03/2016 01:46   Ct Angio Chest Aorta W/cm &/or Wo/cm  01/01/2016  CLINICAL DATA:  Sudden onset of central chest pain radiating to the back. Rule out dissection. EXAM: CT ANGIOGRAPHY CHEST,  ABDOMEN AND PELVIS IMPRESSION: 1. Normal caliber thoracoabdominal aorta without dissection or acute aortic abnormality. Moderate atherosclerosis. 2. Lobular right apical pulmonary nodule measures 2.2 x 1.5 x 1.3 cm with minimal spiculation, concerning for primary bronchogenic neoplasm. There is no adenopathy. Other than a subcentimeter lucency in  T4 which is nonspecific, no evidence of metastatic disease on this arterial phase imaging exams. This bone lucency may simply represent normal variation. Recommend PET-CT characterization. 3. Faint reticulonodular opacity in the subpleural right lower lobe is likely infectious or inflammatory. 4. No acute abnormality in the abdomen/pelvis. Prostatomegaly is incidentally noted. Electronically Signed   By: Rubye Oaks M.D.   On: 01/01/2016 00:47   US Abdomen Limited Ruq  01/06/2016  CLINICAL DATA:  Abnormal transaminase levels.  Fever.  Hypertension. EXAM: US ABDOMEN LIMITED - RIGHT UPPER QUADRANTIMPRESSION: 1. Suspected 8 mm gallstone, although there was poor shadowing from this dependent hyperechogenicity, and mobility was difficult to assess. It is possible that this actually represents tumefactive sludge given the lack of definite shadowing. 2. No gallbladder wall thickening or biliary dilatation. 3. Liver appears unremarkable although visualization of parts of the left lobe was problematic due to the orientation of the left lobe with respect to the stomach. Electronically Signed   By: Gaylyn Rong M.D.   On: 01/06/2016 16:43   _____________  Disposition   Pt is being discharged to SNF today in stable condition.  Follow-up Plans & Appointments    Follow-up Information    Follow up with Garnett Farm, MD. Call on 01/25/2016.   Specialty:  Urology   Why:  9:00 AM   Contact information:   [address] [place] [address] [phone]       Follow up with Jacolyn Reedy, PA-C On 01/18/2016.   Specialty:  Cardiology   Why:  11:40 AM    Contact information:   [address] [place] [address] [phone]       Discharge Medications   Current Discharge Medication List    START taking these medications   Details  albuterol (PROVENTIL) (2.5 MG/3ML) 0.083% nebulizer solution Take 3 mLs (2.5 mg total) by nebulization every 2 (two) hours as needed for wheezing. Qty: 75 mL, Refills: 12    alum & mag hydroxide-simeth (MAALOX/MYLANTA) 200-200-20 MG/5ML suspension Take 30 mLs by mouth every 6 (six) hours as needed for indigestion or heartburn. Qty: 355 mL, Refills: 0    amoxicillin-clavulanate (AUGMENTIN) 875-125 MG tablet Take 1 tablet by mouth 2 (two) times daily. Qty: 4 tablet, Refills: 0    aspirin EC 81 MG EC tablet Take 1 tablet (81 mg total) by mouth daily. Qty: 30 tablet, Refills: 0    atorvastatin (LIPITOR) 80 MG tablet Take 1 tablet (80 mg total) by mouth daily at 6 PM. Qty: 30 tablet, Refills: 0    carvedilol (COREG) 12.5 MG tablet Take 1 tablet (12.5 mg total) by mouth 2 (two) times daily with a meal. Qty: 60 tablet, Refills: 0    feeding supplement, ENSURE ENLIVE, (ENSURE ENLIVE) LIQD Take 237 mLs by mouth 2 (two) times daily between meals. Qty: 237 mL, Refills: 12    guaiFENesin (MUCINEX) 600 MG 12 hr tablet Take 1 tablet (600 mg total) by mouth 2 (two) times daily. Qty: 10 tablet, Refills: 0    losartan (COZAAR) 100 MG tablet Take 1 tablet (100 mg total) by mouth daily. Qty: 30 tablet, Refills: 0    nitroGLYCERIN (NITROSTAT) 0.4 MG SL tablet Place 1 tablet (0.4 mg total) under the tongue every 5 (five) minutes as needed for chest pain. Qty: 30 tablet, Refills: 0    QUEtiapine (SEROQUEL) 25 MG tablet Take 1 tablet (25 mg total) by mouth daily at 6 PM. Qty: 30 tablet, Refills: 0    ticagrelor (BRILINTA)  90 MG TABS tablet Take 1 tablet (90 mg total) by mouth 2 (two) times daily. Qty: 60 tablet, Refills: 0      CONTINUE these medications which have NOT CHANGED   Details  finasteride (PROSCAR) 5  MG tablet Take 5 mg by mouth daily.     Fish Oil-Cholecalciferol (FISH OIL + D3 PO) Take 1 capsule by mouth at bedtime.    SPIRIVA HANDIHALER 18 MCG inhalation capsule Place 1 capsule into inhaler and inhale daily.    tamsulosin (FLOMAX) 0.4 MG CAPS capsule Take 0.8 mg by mouth at bedtime.     VENTOLIN HFA 108 (90 BASE) MCG/ACT inhaler Inhale 2 puffs into the lungs every 6 (six) hours as needed for wheezing or shortness of breath.       STOP taking these medications     CALCIUM-MAGNESIUM-ZINC PO      hydrochlorothiazide (HYDRODIURIL) 25 MG tablet      losartan-hydrochlorothiazide (HYZAAR) 50-12.5 MG tablet      rivastigmine (EXELON) 4.6 mg/24hr          Aspirin prescribed at discharge?  Yes High Intensity Statin Prescribed? (Lipitor 40-80mg  or Crestor 20-40mg ): Yes Beta Blocker Prescribed? Yes For EF 45% or less, Was ACEI/ARB Prescribed? No: N/A ADP Receptor Inhibitor Prescribed? (i.e. Plavix etc.-Includes Medically Managed Patients): Yes For EF <40%, Aldosterone Inhibitor Prescribed? No: N/A Was EF assessed during THIS hospitalization? Yes Was Cardiac Rehab II ordered? (Included Medically managed Patients): Yes   Outstanding Labs/Studies   Follow up BMET in 1 wk. Follow up lipids/lft's in 6-8 wks.  Duration of Discharge Encounter   Greater than 30 minutes including physician time.  Signed, Nicolasa Ducking NP 01/11/2016, 12:17 PM

## 2016-01-12 ENCOUNTER — Encounter (HOSPITAL_COMMUNITY)
Admission: RE | Admit: 2016-01-12 | Discharge: 2016-01-12 | Disposition: A | Discharge status: Home / Self Care | Payer: Medicare Other | Source: Skilled Nursing Facility | Attending: Internal Medicine | Admitting: Internal Medicine

## 2016-01-12 LAB — BASIC METABOLIC PANEL
ANION GAP: 6 (ref 5–15)
BUN: 20 mg/dL (ref 6–20)
CO2: 26 mmol/L (ref 22–32)
Calcium: 8.6 mg/dL — ABNORMAL LOW (ref 8.9–10.3)
Chloride: 109 mmol/L (ref 101–111)
Creatinine, Ser: 1.09 mg/dL (ref 0.61–1.24)
GLUCOSE: 89 mg/dL (ref 65–99)
POTASSIUM: 3.6 mmol/L (ref 3.5–5.1)
Sodium: 141 mmol/L (ref 135–145)

## 2016-01-12 LAB — CBC WITH DIFFERENTIAL/PLATELET
BASOS ABS: 0.1 10*3/uL (ref 0.0–0.1)
Basophils Relative: 0 %
Eosinophils Absolute: 1.3 10*3/uL — ABNORMAL HIGH (ref 0.0–0.7)
Eosinophils Relative: 7 %
HEMATOCRIT: 42.8 % (ref 39.0–52.0)
HEMOGLOBIN: 14.5 g/dL (ref 13.0–17.0)
LYMPHS PCT: 8 %
Lymphs Abs: 1.6 10*3/uL (ref 0.7–4.0)
MCH: 30.2 pg (ref 26.0–34.0)
MCHC: 33.9 g/dL (ref 30.0–36.0)
MCV: 89.2 fL (ref 78.0–100.0)
Monocytes Absolute: 2 10*3/uL — ABNORMAL HIGH (ref 0.1–1.0)
Monocytes Relative: 10 %
NEUTROS ABS: 14.5 10*3/uL — AB (ref 1.7–7.7)
NEUTROS PCT: 75 %
Platelets: 283 10*3/uL (ref 150–400)
RBC: 4.8 MIL/uL (ref 4.22–5.81)
RDW: 13.4 % (ref 11.5–15.5)
WBC: 19.5 10*3/uL — AB (ref 4.0–10.5)

## 2016-01-13 ENCOUNTER — Encounter: Payer: Self-pay | Admitting: Internal Medicine

## 2016-01-13 ENCOUNTER — Non-Acute Institutional Stay (SKILLED_NURSING_FACILITY): Payer: Medicare Other | Admitting: Internal Medicine

## 2016-01-13 DIAGNOSIS — R911 Solitary pulmonary nodule: Secondary | ICD-10-CM | POA: Diagnosis not present

## 2016-01-13 DIAGNOSIS — I214 Non-ST elevation (NSTEMI) myocardial infarction: Secondary | ICD-10-CM | POA: Diagnosis not present

## 2016-01-13 DIAGNOSIS — R339 Retention of urine, unspecified: Secondary | ICD-10-CM

## 2016-01-13 DIAGNOSIS — J189 Pneumonia, unspecified organism: Secondary | ICD-10-CM

## 2016-01-13 DIAGNOSIS — R319 Hematuria, unspecified: Secondary | ICD-10-CM | POA: Diagnosis not present

## 2016-01-13 DIAGNOSIS — G9340 Encephalopathy, unspecified: Secondary | ICD-10-CM

## 2016-01-13 NOTE — Assessment & Plan Note (Signed)
Monitor for changes in mental status. Minimize any medications with risk of affecting mental status.

## 2016-01-13 NOTE — Assessment & Plan Note (Signed)
Continue dual anticoagulant therapy ;but monitor closely for gastritis as he has a history of GI bleeding on aspirin and monitor for progression of the hematuria.:

## 2016-01-13 NOTE — Assessment & Plan Note (Addendum)
Monitor for progression of hematuria while on the dual anticoagulant therapy. Follow-up with urology 2 weeks post discharge 01/11/16. Continue generic Proscar. Follow-up with urology as recommended.

## 2016-01-13 NOTE — Assessment & Plan Note (Signed)
Monitor for signs or symptoms of bronchitis/pneumonia

## 2016-01-13 NOTE — Patient Instructions (Signed)
Copy of operative exam faxed to [facility]

## 2016-01-13 NOTE — Progress Notes (Signed)
Subjective:    Patient ID: Lee Chang, male    DOB: [birth year], 75 y.o.   MRN: [id number]  HPI primary care physician Meredith Pelaniel Daniels M.D. [facility], [place], [place] This is a comprehensive admission to [facility] personally performed by Marga MelnickWilliam Hopper MD on this date less than 30 days from date of admission. HPI: His medical records 3/10-3/21/17 were reviewed in detail and summary added under active diagnoses in the problem list with formation of therapeutic plan. In short he was admitted with a non-STEMI with documentation of diffuse disease @ cath 3/11. 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 Stents were placed. Brillanta and aspirin were initiated The dual therapy was continued despite the hematuria in context of BPH and urinary retention for which a Foley was placed  He did present with a productive cough and CT angiogram suggested possible right lower lobe CAP.There  also was a RUL nodule for which follow-up PET scan or CT scan was recommended Complications included varying degrees of delirium, felt to be multifactorial by neurology. CT scan of the head did suggest an old left basal ganglia lacunar infarct. Exelon was discontinued.  Past medical and surgical history: Significant past history includes GI bleed while on aspirin at least a decade ago He has a history of obstructive lung disease as well as asthma. Catheterization was performed 01/01/16 with placement of stents. Remotely he had hernia repair as well. He states he's had a tonsillectomy. Social history: He states he does not drink. He smoked 20 years less than one pack per day, quitting in 2006.  Family history: Brother diabetes;he  denies family history of heart attack, stroke, or cancer.   Review of Systems  Comprehensive review of systems:Constitutional: No fever, chills, significant weight change, fatigue, weakness or night sweats Eyes: No redness, discharge,  pain, blurred vision, double vision, or loss of vision ENT/mouth: No nasal congestion, postnasal drainage,epistaxis, purulent discharge, earache, hearing loss, tinnitus ,sore throat , dental pain, or hoarseness   Cardiovascular: no chest pain, palpitations, racing, irregular rhythm, syncope, nausea, sweating, claudication, or edema  Respiratory: No cough, sputum production,hemoptysis,  dyspnea, paroxysmal nocturnal dyspnea, pleuritic chest pain, significant snoring, or  apnea    Gastrointestinal: No heartburn,dysphagia, nausea and vomiting,ominal pain, change in bowels, anorexia, diarrhea, significant constipation, rectal bleeding, melena,  stool incontinence or jaundice Genitourinary: As noted above Musculoskeletal: No myalgias or muscle cramping, joint stiffness, joint swelling, joint color change, weakness, or cyanosis Dermatologic: No rash, pruritus, urticaria, or change in color or temperature of skin Neurologic: No active headache, vertigo, limb weakness, tremor, gait disturbance, seizures, memory loss, numbness or tingling area and he states he's had some headaches in the past; he did not seek medical treatment of such. Psychiatric: No significant anxiety or depression, anhedonia, panic attacks, insomnia, or anorexia Endocrine: No change in hair/skin/ nails, excessive thirst, excessive hunger, excessive urination, or unexplained fatigue Hematologic/lymphatic: Bruising on the dual anticoagulant therapy.  Allergy/immunology: No itchy/ watery eyes, abnormal sneezing, rhinitis, urticaria ,or angioedema       Objective:   Physical Exam  Physical exam:  Pertinent or positive findings: He admits pattern alopecia. He has a beard and mustache. His speech is slightly slurred and slightly difficult to discern. There is slight erythema of the nasal mucosa. As complete dentures. This is present on the left. Pupils are small. Rate is slow. He has minor rales at the bases with no increased work of  breathing. Regular bruising is present  over the forearms particularly on the right. Toenails are thickened and deformed. Fingernails exhibit clubbing. Foley catheter is in place. The urine appears to be slight heme tinged. He gave the date as March 20 something but was unable to give the year. General appearance:Adequately nourished; no acute distress or increased work of breathing is present.   Lymphatic: No  lymphadenopathy about the head, neck, or axilla . Eyes: No conjunctival inflammation or lid edema is present. There is no scleral icterus. Ears:  External ear exam shows no significant lesions or deformities.   Nose:  External nasal examination shows no deformity or inflammation.  Oral exam:  lips and gums are healthy appearing.There is no oropharyngeal erythema or exudate . Neck:  No deformities, thyromegaly, masses, or tenderness noted.   Supple with full range of motion without pain.  Heart:   regular rhythm. S1 and S2 normal without gallop, murmur, click, rub or other extra sounds.  Abdomen:Bowel sounds are normal. Abdomen is soft and nontender with no organomegaly, hernias  or masses. GU: deferred as previously addressed by Urology. Extremities:  No cyanosis, edema, or clubbing noted. Neurologic exam : Cn 2-7 intact Strength equal & normal in upper & lower extremities Deep tendon reflexes are normal. Skin: Warm & dry w/o tenting or jaundice. No significant lesions or rash.        Assessment & Plan:  See summary under each active problem in the Problem List with associated updated therapeutic plan

## 2016-01-13 NOTE — Assessment & Plan Note (Signed)
Further imaging can be completed once he is stable from the cardiovascular acute process

## 2016-01-18 ENCOUNTER — Encounter: Payer: Self-pay | Admitting: Physician Assistant

## 2016-01-18 ENCOUNTER — Ambulatory Visit (INDEPENDENT_AMBULATORY_CARE_PROVIDER_SITE_OTHER): Payer: Medicare Other | Admitting: Physician Assistant

## 2016-01-18 VITALS — BP 108/62 | HR 55 | Ht 68.0 in | Wt 185.2 lb

## 2016-01-18 DIAGNOSIS — F0391 Unspecified dementia with behavioral disturbance: Secondary | ICD-10-CM | POA: Diagnosis not present

## 2016-01-18 DIAGNOSIS — R001 Bradycardia, unspecified: Secondary | ICD-10-CM | POA: Diagnosis not present

## 2016-01-18 DIAGNOSIS — E785 Hyperlipidemia, unspecified: Secondary | ICD-10-CM

## 2016-01-18 DIAGNOSIS — F0390 Unspecified dementia without behavioral disturbance: Secondary | ICD-10-CM

## 2016-01-18 DIAGNOSIS — I214 Non-ST elevation (NSTEMI) myocardial infarction: Secondary | ICD-10-CM | POA: Diagnosis not present

## 2016-01-18 DIAGNOSIS — R319 Hematuria, unspecified: Secondary | ICD-10-CM

## 2016-01-18 DIAGNOSIS — I10 Essential (primary) hypertension: Secondary | ICD-10-CM

## 2016-01-18 NOTE — Assessment & Plan Note (Signed)
Patient had recent MI treated with DES to the RCA 2. He is now on Brilinta and aspirin. He continues to have hematuria and does have a Foley catheter placed. We will check labs today. He sees urology next week. He has not had any chest pain. Continue Lipitor, Coreg and Cozaar. Follow-up with Dr. Diona BrownerMcDowell in 4-6 weeks.

## 2016-01-18 NOTE — Assessment & Plan Note (Addendum)
To see urology next week

## 2016-01-18 NOTE — Assessment & Plan Note (Signed)
Patient had dementia prior to hospitalization but had acute delirium while in the hospital followed by neurology. Since being started on Seroquel his daughter says he gets very sleepy in the afternoon and can't stay awake. They would like to follow-up with neurology here in [place]. Will make a referral.

## 2016-01-18 NOTE — Assessment & Plan Note (Signed)
Patient continues to have some shortness of breath and wheezing. He should get his inhal[facility] regularly at the [facility]. He also has a nodule found on CT scan that needs worked up at a later date.

## 2016-01-18 NOTE — Assessment & Plan Note (Signed)
Pressure well controlled.

## 2016-01-18 NOTE — Patient Instructions (Signed)
Your physician recommends that you schedule a follow-up appointment in: 4-6 weeks with Dr. Diona BrownerMcDowell  Your physician recommends that you continue on your current medications as directed. Please refer to the Current Medication list given to you today.  You have been referred to Dr. Gerilyn Pilgrimoonquah  Your physician recommends that you have lab work Done today.( CBC, BMET)  Your physician recommends that you return for lab work in: 4-6 Weeks (Liver and Lipids) Fasting  If you need a refill on your cardiac medications before your next appointment, please call your pharmacy.  Thank you for choosing [facility]!

## 2016-01-18 NOTE — Progress Notes (Signed)
Cardiology Office Note   Date:  01/18/2016   ID:  Lee Chang 1940-11-04, MRN [id number]  PCP:  [name], MD  Cardiologist:  Dr. Leona Singleton  Chief Complaint: dementia, shortness of breath    History of Present Illness: Lee Chang is a 75 y.o. male who presents for post hospital follow-up. He was admitted to [facility] 12/30/09 with chest pain and abdominal discomfort. He ruled in for non-STEMI with peak troponins of 3.19. Echo showed normal LV function. He was transferred to [facility] and was treated with 2 drug-eluting stents to the RCA. He had nonobstructive LAD and obtuse marginal disease. He was treated with aspirin, Brilinta, statin and beta blocker and ARB. He did develop acute delirium the day following cath and head CT showed an old left basal ganglia lacunar infarct with him volitional changes and minimal chronic small vessel ischemic changes. Neurology felt acute delirium was multifactorial. The patient also had presumed community acquired pneumonia treated with Zosyn and switch to Augmentin. He did develop hematuria and is being followed by urology. We could not interrupt his aspirin and Brilinta therapy.  Patient is now staying at the [facility]. He is accompanied today by his daughter. He is still having a lot of trouble with his dementia and delirium. She says he is usually good in the morning and by the afternoon he is much worse and falling asleep very early in the day. He's not doing much rehabilitation and is pretty sedentary in a wheelchair. He complains of dyspnea and only gets his inhalers when his daughter asked for them. He denies any chest pain, palpitations, dizziness or presyncope. He is still having hematuria and has his Foley catheter in. He sees the urologist next week. He is not scheduled to see neurology and would like to be seen here in [place].    Past Medical History  Diagnosis Date  . Ulcer   . Asthma   . Essential hypertension   . NSTEMI  (non-ST elevated myocardial infarction) (HCC) 12/31/2015    Echo 01/01/16 (pre-CATH-PCI): EF 60-65%, no RWMA.   . 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 Moderate OM1 35% & midLAD 45%  . 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 Dyslipidemia, goal LDL below 70 01/02/2016  . COPD (chronic obstructive pulmonary disease) (HCC) 01/01/2016  . Lung nodule 01/01/2016  . Dementia   . BPH (benign prostatic hypertrophy) with urinary obstruction   . Acute urinary retention     Past Surgical History  Procedure Laterality Date  . Hernia repair    . Cardiac catheterization N/A 01/01/2016    Procedure: Left Heart Cath and Coronary Angiography;  Surgeon: Marykay Lex, MD;  Location: [facility] INVASIVE CV LAB;  Service: Cardiovascular;  Laterality: N/A;  . Cardiac catheterization Right 01/01/2016    Procedure: Coronary Stent Intervention;  Surgeon: Marykay Lex, MD;  Location: [facility] INVASIVE CV LAB;  Service: Cardiovascular;  Laterality: Right;  . Tonsillectomy       No current outpatient prescriptions on file.   No current facility-administered medications for this visit.    Allergies:   Asa    Social History:  The patient  reports that he quit smoking  about 11 years ago. He does not have any smokeless tobacco history on file. He reports that he does not drink alcohol.   Family History:  The patient's    family history includes Diabetes in his brother. There is no history of Cancer, Stroke, or Heart disease.    ROS:  Please see the history of present illness.   Otherwise, review of systems are positive for none.   All other systems are reviewed and negative.    PHYSICAL EXAM: VS:  Ht 5\' 8"  (1.727 m)  Wt 185 lb 3.2 oz (84.006 kg)   BMI 28.17 kg/m2 , BMI Body mass index is 28.17 kg/(m^2). GEN: Well nourished, well developed, in no acute distress Neck: no JVD, HJR, carotid bruits, or masses Cardiac:  RRR; no murmurs,gallop, rubs, thrill or heave,  Respiratory:  Decreased breath sounds with crackles at the right lung base  GI: soft, nontender, nondistended, + BS MS: no deformity or atrophy Extremities: Right arm without hematoma or hemorrhage at cath site good radial and brachial pulses, lower extremities without cyanosis, clubbing, edema, good distal pulses bilaterally.  Skin: warm and dry, no rash Neuro:  Confused    EKG:  EKG is ordered today. The ekg ordered today demonstrates sinus bradycardia at 55 bpm with incomplete right bundle branch block, inferior Q waves, nonspecific ST-T wave changes, no acute change   Recent Labs: 01/01/2016: B Natriuretic Peptide 36.1; TSH 1.717 01/07/2016: ALT 15* 01/12/2016: BUN 20; Creatinine, Ser 1.09; Hemoglobin 14.5; Platelets 283; Potassium 3.6; Sodium 141    Lipid Panel No results found for: CHOL, TRIG, HDL, CHOLHDL, VLDL, LDLCALC, LDLDIRECT    Wt Readings from Last 3 Encounters:  01/18/16 185 lb 3.2 oz (84.006 kg)  01/13/16 185 lb 3.2 oz (84.006 kg)  01/11/16 194 lb 1.6 oz (88.043 kg)      Other studies Reviewed: Additional studies/ records that were reviewed today include and review of the records demonstrates:   Cardiac Catheterization and Percutaneous Coronary Intervention 3.11.2017 Coronary Findings       Dominance: Right      Left Main   . Vessel is large.         Left Anterior Descending   . Vessel is large. The vessel . Mildly tortuous     .  Prox LAD to Mid LAD lesion, 45% stenosed. Diffuse eccentric.     .  First Diagonal Branch     The vessel is small in size.     .  First Septal Branch     The vessel is small in size.     Marland Kitchen.  Second Diagonal Branch     The vessel is tortuous.     .  Lateral Second Diagonal Branch     The vessel is moderate in  size.     Marland Kitchen.  Second Septal Branch     The vessel is small in size.     .  Third Diagonal Branch     The vessel is small in size.         Left Circumflex     .  First Obtuse Marginal Branch     The vessel is moderate in size.     .  1st Mrg lesion, 35% stenosed. Diffuse. Somewhat irregular At the distal portion of this lesion, there is a small caliber branch of the OM that cannot be seen in the available diagram.     .  Lateral First Obtuse Marginal Branch  The vessel is moderate in size.     Marland Kitchen  Second Obtuse Marginal Branch     The vessel is moderate in size.     .  Lateral Second Obtuse Marginal Branch     The vessel is small in size.         Right Coronary Artery   . Vessel is large. The intervening segment between the proximal lesion and the mid lesion appears to be ectatic and somewhat irregular/ulcerated. There is a very short relatively normal segment at the takeoff of the marginal branch with thrombus present     .  Prox RCA lesion, 99% stenosed. Mildly Calcified diffuse with heavy thrombus eccentric ulcerative. Clearly this was the original culprit lesion for his initial event on March 10          .  Mid RCA-1 lesion, 60% stenosed. Diffuse tubular eccentric.     Marland Kitchen  PCI: The proximal and mid RCA were successfully stented using a 3.5 x 38 mm Synergy DES.    Marland Kitchen  There is no residual stenosis post intervention.        .  Mid RCA-2 lesion, 80% stenosed. Tubular eccentric.     Marland Kitchen  PCI: The Mid RCA was successfully stented using a 3.0 x 28 mm Synergy DES.   Marland Kitchen  There is no residual stenosis post intervention.        .  Acute Marginal Branch     The vessel is moderate in size.     .  Right Posterior Descending Artery     The vessel is moderate in size.     Marland Kitchen  RPDA lesion, 100% stenosed. Discrete thrombotic. Likely distal thromboembolic event from the initial MI. Unable to cross with wire     .  Inferior Septal     The vessel is small in size.     .  First Right Posterolateral      The vessel is moderate in size.     Marland Kitchen  Second Right Posterolateral     The vessel is small in size.     .  Third Right Posterolateral     The vessel is small in size.      Left Ventricle The left ventricular size is normal. The left ventricular systolic function is normal. The left ventricular ejection fraction is 55-65% by visual estimate. There are wall motion abnormalities in the left ventricle. There are segmental wall motion abnormalities in the left ventricle.   Mitral Valve There is no mitral valve regurgitation.   Aortic Valve There is no aortic valve stenosis, and no aortic valve regurgitation. _____________  2D Echocardiogram 3.11.2017  Study Conclusions   - Left ventricle: The cavity size was normal. Systolic function was   normal. The estimated ejection fraction was in the range of 60%   to 65%. Wall motion was normal; there were no regional wall   motion abnormalities. - Aortic valve: Valve area (VTI): 2.88 cm^2. Valve area (Vmax):   3.11 cm^2. Valve area (Vmean): 2.62 cm^2. _____________      ASSESSMENT AND PLAN:  NSTEMI (non-ST elevated myocardial infarction) [facility]) Patient had recent MI treated with DES to the RCA 2. He is now on Brilinta and aspirin. He continues to have hematuria and does have a Foley catheter placed. We will check labs today. He sees urology next week. He has not had any chest pain. Continue Lipitor, Coreg and Cozaar. Follow-up with Dr. Diona Browner in 4-6 weeks.  Essential  hypertension Pressure well controlled  COPD (chronic obstructive pulmonary disease) (HCC) Patient continues to have some shortness of breath and wheezing. He should get his inhalers [facility] at the [facility]. He also has a nodule found on CT scan that needs worked up at a later date.  Hematuria To see urology next week  Dyslipidemia, goal LDL below 70 Continue Lipitor. Follow-up lipids in 6 weeks  Dementia Patient had dementia prior to hospitalization but had acute  delirium while in the hospital followed by neurology. Since being started on Seroquel his daughter says he gets very sleepy in the afternoon and can't stay awake. They would like to follow-up with neurology here in [place]. Will make a referral.    Signed, Jacolyn Reedy, PA-C  01/18/2016 11:54 AM    [facility] [address] [place], [place], [place]  [address] Phone: [phone]; Fax: [phone]

## 2016-01-18 NOTE — Assessment & Plan Note (Signed)
Continue Lipitor. Follow-up lipids in 6 weeks

## 2016-01-19 ENCOUNTER — Non-Acute Institutional Stay (SKILLED_NURSING_FACILITY): Payer: Medicare Other | Admitting: Internal Medicine

## 2016-01-19 ENCOUNTER — Encounter (HOSPITAL_COMMUNITY)
Admission: RE | Admit: 2016-01-19 | Discharge: 2016-01-19 | Disposition: A | Discharge status: Home / Self Care | Payer: Medicare Other | Source: Skilled Nursing Facility | Attending: *Deleted | Admitting: *Deleted

## 2016-01-19 ENCOUNTER — Encounter: Payer: Self-pay | Admitting: Internal Medicine

## 2016-01-19 DIAGNOSIS — E876 Hypokalemia: Secondary | ICD-10-CM

## 2016-01-19 DIAGNOSIS — R319 Hematuria, unspecified: Secondary | ICD-10-CM

## 2016-01-19 DIAGNOSIS — I214 Non-ST elevation (NSTEMI) myocardial infarction: Secondary | ICD-10-CM

## 2016-01-19 LAB — BASIC METABOLIC PANEL
ANION GAP: 8 (ref 5–15)
BUN: 14 mg/dL (ref 6–20)
CO2: 27 mmol/L (ref 22–32)
Calcium: 8.8 mg/dL — ABNORMAL LOW (ref 8.9–10.3)
Chloride: 107 mmol/L (ref 101–111)
Creatinine, Ser: 1.23 mg/dL (ref 0.61–1.24)
GFR calc non Af Amer: 56 mL/min — ABNORMAL LOW (ref >60)
GLUCOSE: 104 mg/dL — AB (ref 65–99)
POTASSIUM: 3.3 mmol/L — AB (ref 3.5–5.1)
Sodium: 142 mmol/L (ref 135–145)

## 2016-01-19 LAB — URINALYSIS W MICROSCOPIC (NOT AT ARMC)
BILIRUBIN URINE: NEGATIVE
Glucose, UA: NEGATIVE mg/dL
KETONES UR: NEGATIVE mg/dL
NITRITE: POSITIVE — AB
Specific Gravity, Urine: 1.01 (ref 1.005–1.030)
pH: 6 (ref 5.0–8.0)

## 2016-01-19 LAB — CBC WITH DIFFERENTIAL/PLATELET
BASOS ABS: 0.1 10*3/uL (ref 0.0–0.1)
Basophils Relative: 1 %
Eosinophils Absolute: 0.9 10*3/uL — ABNORMAL HIGH (ref 0.0–0.7)
Eosinophils Relative: 7 %
HEMATOCRIT: 40.1 % (ref 39.0–52.0)
HEMOGLOBIN: 13 g/dL (ref 13.0–17.0)
LYMPHS PCT: 13 %
Lymphs Abs: 1.7 10*3/uL (ref 0.7–4.0)
MCH: 29.3 pg (ref 26.0–34.0)
MCHC: 32.4 g/dL (ref 30.0–36.0)
MCV: 90.3 fL (ref 78.0–100.0)
MONO ABS: 1.1 10*3/uL — AB (ref 0.1–1.0)
Monocytes Relative: 8 %
NEUTROS ABS: 9.5 10*3/uL — AB (ref 1.7–7.7)
NEUTROS PCT: 71 %
Platelets: 341 10*3/uL (ref 150–400)
RBC: 4.44 MIL/uL (ref 4.22–5.81)
RDW: 13.8 % (ref 11.5–15.5)
WBC: 13.1 10*3/uL — AB (ref 4.0–10.5)

## 2016-01-19 NOTE — Progress Notes (Signed)
Patient ID: Lee Chang, male   DOB: 1940-12-20, 75 y.o.   MRN: [id number]  This is an acute visit.  Level care skilled.  Facility [facility].  Chief complaint acute visit secondary to hypokalemia-hematuria-follow-up leukocytosis with history of UTI      :   HPI  . In short he was admitted with a non-STEMI with documentation of diffuse disease @ cath 3/11. 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 Stents were placed. Brillanta and aspirin were initiated The dual therapy was continued despite the hematuria in context of BPH and urinary retention for which a Foley was placed --he actually saw cardiology yesterday- In the hospital He did present with a productive cough and CT angiogram suggested possible right lower lobe CAP.There  also was a RUL nodule for which follow-up PET scan or CT scan was recommended Complications included varying degrees of delirium, felt to be multifactorial by neurology. CT scan of the head did suggest an old left basal ganglia lacunar infarct. Exelon was discontinued cardiology did order a neurology consult yesterday Patient continues to have some hematuria he does have a urology appointment scheduled for tomorrow.  Blood work today indicates a stable hemoglobin of 13.0-his leukocytosis which had been over 19,000 week ago has come down to 13,100.  He is afebrile-he has completed his antibiotic.  Lab work also showed a mildly depressed potassium of 3.3 he is not on potassium supplementation currently creatinine was 1.23 BUN of 14.  Past medical and surgical history: Significant past history includes GI bleed while on aspirin at least a decade ago He has a history of obstructive lung disease as well as asthma. Catheterization was performed 01/01/16 with placement of stents. Remotely he had hernia repair as well. He states he's had a tonsillectomy. Social history: He states he does not drink. He smoked 20 years less than one pack per  day, quitting in 2006.  Family history: Brother diabetes;he  denies family history of heart attack, stroke, or cancer.  Medications have been reviewed per MAR.  They include.  Albuterol nebulizers when necessary  Aspirin enteric-coated 81 mg daily.  Brilinta 90 mg twice a day.  Coreg 12.5 mg twice a day.  Fish oil vitamin D [phone] milligrams-daily at bedtime.  Flomax 0.4 mg take 2 daily at bedtime.  Lipitor 80 mg daily.  Valsartan 100 mg daily.  Maalox when necessary every 6 hours.  Nitroglycerin when necessary.  Nystatin 1000 units-5 mL-4 times a day.  Post car 5 mg every PM.  Seroquel 25 mg every afternoon.  Spiriva inhaler once a day daily at bedtime.  Ventolin aerosol inhaler-every 6 hours when necessary.  T  Review of Systems  Comprehensive review of systems:Constitutional: No fever, chills, significant weight change, fatigue, weakness or night sweats Eyes: No redness, discharge, pain, blurred vision, double vision, or loss of vision ENT/mouth: No nasal congestion, postnasal drainage,epistaxis, purulent discharge, earache, hearing loss, tinnitus ,sore throat , dental pain, or hoarseness   Cardiovascular: no chest pain, palpitations, racing, irregular rhythm, syncope, nausea, sweating, claudication, or edema  Respiratory: No cough, sputum production,hemoptysis,  dyspnea, paroxysmal nocturnal dyspnea, pleuritic chest pain, significant snoring, or  apnea    Gastrointestinal: No heartburn,dysphagia, nausea and vomiting,ominal pain, change in bowels, anorexia, diarrhea, significant constipation, rectal bleeding, melena,  stool incontinence or jaundice Genitourinary: As noted above continues with hematuria Musculoskeletal: No myalgias or muscle cramping, joint stiffness, joint swelling, joint color change, weakness, or cyanosis Dermatologic: No rash, pruritus, urticaria, or  change in color or temperature of skin Neurologic: No active headache, vertigo, limb  weakness, tremor, gait disturbance, seizures, memory loss, numbness or tingling area and he states he's had some headaches in the past;  Psychiatric: No significant anxiety or depression, anhedonia, panic attacks, insomnia, or anorexia--confusion does persist Endocrine: No change in hair/skin/ nails, excessive thirst, excessive hunger, excessive urination, or unexplained fatigue Hematologic/lymphatic: Bruising on the dual anticoagulant therapy.  Allergy/immunology: No itchy/ watery eyes, abnormal sneezing, rhinitis, urticaria ,or angioedema       Objective:   Physical Exam Temperature 98.3 pulse 60 respirations 21 blood pressure 115/51 Physical exam:  Pertinent or positive findings: He admits pattern alopecia. He has a beard and mustache. His speech is slightly slurred but understandable T  He has minor rales at the bases with no increased work of breathing. Regular bruising is present over the forearms particularly on the right. . Foley catheter is in place. Continues to have hematuria. Was able to tell me the year initially said the month was February but was correcting this to March-he did know the day of the week was Wednesday General appearance:Adequately nourished; no acute distress or increased work of breathing is present.   . Eyes: No conjunctival inflammation or lid edema is present. There is no scleral icterus.   Oral exam:  lips and gums are healthy appearing.There is no oropharyngeal erythema or exudate .   Heart:   regular rhythm.  Heart sounds are distant.  Abdomen:Bowel sounds are normal. Abdomen is soft and nontender with no organomegaly, hernias  or masses. GU: Foley catheter is in place with hematuria- Extremities:  No cyanosis, edema, or clubbing noted. Neurologic exam : Cn 2-7 intact Strength equal & normal in upper & lower extremities  Skin: Warm & dry w/o tenting or jaundice. No significant lesions or rash.   Labs.  01/19/2016.  Sodium 142 potassium 3.3  BUN 14 creatinine 1.23.  WBC 13.1 hemoglobin 13.0 platelets 391.          Assessment & Plan:  #1-hypokalemia-this is fairly mild will supplement with 20 mEq of potassium a day and recheck this on Friday, March 31 as this will also need to be rechecked next week-also will check a magnesium level on Friday.  #2 history of hematuria-he does have urology follow-up-hemoglobin is stable at this point monitor closely.  #3 history of leukocytosis this appears to be resolvingshe is afebrile does not give a septic presentation has finished antibiotic for UTI will update this as well on Friday.  [id number]

## 2016-01-20 DIAGNOSIS — N401 Enlarged prostate with lower urinary tract symptoms: Secondary | ICD-10-CM | POA: Diagnosis not present

## 2016-01-20 DIAGNOSIS — R338 Other retention of urine: Secondary | ICD-10-CM | POA: Diagnosis not present

## 2016-01-20 DIAGNOSIS — N138 Other obstructive and reflux uropathy: Secondary | ICD-10-CM | POA: Diagnosis not present

## 2016-01-20 LAB — URINE CULTURE: CULTURE: NO GROWTH

## 2016-01-21 ENCOUNTER — Encounter (HOSPITAL_COMMUNITY)
Admission: RE | Admit: 2016-01-21 | Discharge: 2016-01-21 | Disposition: A | Discharge status: Home / Self Care | Payer: Medicare Other | Source: Skilled Nursing Facility | Attending: Internal Medicine | Admitting: Internal Medicine

## 2016-01-21 LAB — CBC WITH DIFFERENTIAL/PLATELET
BASOS ABS: 0 10*3/uL (ref 0.0–0.1)
Basophils Relative: 0 %
Eosinophils Absolute: 0.6 10*3/uL (ref 0.0–0.7)
Eosinophils Relative: 6 %
HEMATOCRIT: 38.7 % — AB (ref 39.0–52.0)
Hemoglobin: 12.7 g/dL — ABNORMAL LOW (ref 13.0–17.0)
LYMPHS ABS: 1.5 10*3/uL (ref 0.7–4.0)
LYMPHS PCT: 15 %
MCH: 29.5 pg (ref 26.0–34.0)
MCHC: 32.8 g/dL (ref 30.0–36.0)
MCV: 90 fL (ref 78.0–100.0)
Monocytes Absolute: 0.8 10*3/uL (ref 0.1–1.0)
Monocytes Relative: 8 %
NEUTROS ABS: 7.1 10*3/uL (ref 1.7–7.7)
Neutrophils Relative %: 71 %
Platelets: 339 10*3/uL (ref 150–400)
RBC: 4.3 MIL/uL (ref 4.22–5.81)
RDW: 13.8 % (ref 11.5–15.5)
WBC: 10.1 10*3/uL (ref 4.0–10.5)

## 2016-01-21 LAB — BASIC METABOLIC PANEL
ANION GAP: 8 (ref 5–15)
BUN: 14 mg/dL (ref 6–20)
CHLORIDE: 107 mmol/L (ref 101–111)
CO2: 27 mmol/L (ref 22–32)
Calcium: 9 mg/dL (ref 8.9–10.3)
Creatinine, Ser: 1.19 mg/dL (ref 0.61–1.24)
GFR calc Af Amer: >60 mL/min (ref >60)
GFR, EST NON AFRICAN AMERICAN: 58 mL/min — AB (ref >60)
GLUCOSE: 104 mg/dL — AB (ref 65–99)
POTASSIUM: 3.6 mmol/L (ref 3.5–5.1)
SODIUM: 142 mmol/L (ref 135–145)

## 2016-01-25 DIAGNOSIS — G253 Myoclonus: Secondary | ICD-10-CM | POA: Diagnosis not present

## 2016-01-25 DIAGNOSIS — R269 Unspecified abnormalities of gait and mobility: Secondary | ICD-10-CM | POA: Diagnosis not present

## 2016-01-25 DIAGNOSIS — G467 Other lacunar syndromes: Secondary | ICD-10-CM | POA: Diagnosis not present

## 2016-01-25 DIAGNOSIS — G9340 Encephalopathy, unspecified: Secondary | ICD-10-CM | POA: Diagnosis not present

## 2016-01-26 ENCOUNTER — Encounter (HOSPITAL_COMMUNITY)
Admission: RE | Admit: 2016-01-26 | Discharge: 2016-01-26 | Disposition: A | Discharge status: Home / Self Care | Payer: Medicare Other | Source: Skilled Nursing Facility | Attending: Internal Medicine | Admitting: Internal Medicine

## 2016-01-26 ENCOUNTER — Non-Acute Institutional Stay (SKILLED_NURSING_FACILITY): Payer: Medicare Other | Admitting: Internal Medicine

## 2016-01-26 ENCOUNTER — Encounter: Payer: Self-pay | Admitting: Internal Medicine

## 2016-01-26 DIAGNOSIS — J189 Pneumonia, unspecified organism: Secondary | ICD-10-CM | POA: Insufficient documentation

## 2016-01-26 DIAGNOSIS — J449 Chronic obstructive pulmonary disease, unspecified: Secondary | ICD-10-CM | POA: Insufficient documentation

## 2016-01-26 DIAGNOSIS — I25118 Atherosclerotic heart disease of native coronary artery with other forms of angina pectoris: Secondary | ICD-10-CM | POA: Insufficient documentation

## 2016-01-26 DIAGNOSIS — E876 Hypokalemia: Secondary | ICD-10-CM

## 2016-01-26 DIAGNOSIS — G9340 Encephalopathy, unspecified: Secondary | ICD-10-CM | POA: Diagnosis not present

## 2016-01-26 DIAGNOSIS — M6281 Muscle weakness (generalized): Secondary | ICD-10-CM | POA: Insufficient documentation

## 2016-01-26 DIAGNOSIS — N289 Disorder of kidney and ureter, unspecified: Secondary | ICD-10-CM | POA: Diagnosis not present

## 2016-01-26 LAB — BASIC METABOLIC PANEL
ANION GAP: 6 (ref 5–15)
BUN: 17 mg/dL (ref 6–20)
CALCIUM: 8.7 mg/dL — AB (ref 8.9–10.3)
CO2: 25 mmol/L (ref 22–32)
Chloride: 110 mmol/L (ref 101–111)
Creatinine, Ser: 1.39 mg/dL — ABNORMAL HIGH (ref 0.61–1.24)
GFR calc Af Amer: 56 mL/min — ABNORMAL LOW (ref >60)
GFR, EST NON AFRICAN AMERICAN: 48 mL/min — AB (ref >60)
GLUCOSE: 107 mg/dL — AB (ref 65–99)
Potassium: 4.4 mmol/L (ref 3.5–5.1)
SODIUM: 141 mmol/L (ref 135–145)

## 2016-01-26 NOTE — Progress Notes (Signed)
Patient ID: Lee Chang, male   DOB: 1941-02-24, 75 y.o.   MRN: [id number]    This is an acute visit.  Level care skilled.  Facility [facility].  Chief complaint acute visit follow-up renal insufficiency-hypokalemia-somnolence      :   HPI  . In short he was admitted with a non-STEMI with documentation of diffuse disease @ cath 3/11. 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 Stents were placed. Brillanta and aspirin were initiated The dual therapy was continued despite the hematuria in context of BPH and urinary retention for which a Foley was placed --he actually saw cardiology yesterday- In the hospital He did present with a productive cough and CT angiogram suggested possible right lower lobe CAP.There  also was a RUL nodule for which follow-up PET scan or CT scan was recommended Complications included varying degrees of delirium, felt to be multifactorial by neurology. CT scan of the head did suggest an old left basal ganglia lacunar infarct. Exelon was discontinued cardiology did order a neurology consult This was completed yesterday per review of neurology note   recommendation was to discontinue Seroquel secondary to concerns of somnolence-also neurology is considering restarting the Exelon patch at some point.   Last week patient did have some hematuria he did see urology last week recommend  was to start Alfuzosin and continue Flomax and Proscar--However I see is subsequent telephone order about discontinuing the alfuzosin-Apparently because it interfered with his cardiac medications t   Lab work done today shows creatinine is slightly up from his baseline at 1.39 previous ones have run averaging it appears about the 1. 0-1 0.2 range-apparently is not drinking really well and I did encourage him strongly at bedside to drink more fluids.  Otherwise potassium is normalized at 4.4 it had been in the low threes last week --he  was started on 20 mEq of  potassium I suspect we can come down on the potassium dose he is not on any diuretic.  Today he is at bedside appears to be stable alert talkative somewhat confused very pleasant-    Past medical and surgical history: Significant past history includes GI bleed while on aspirin at least a decade ago He has a history of obstructive lung disease as well as asthma. Catheterization was performed 01/01/16 with placement of stents. Remotely he had hernia repair as well. He states he's had a tonsillectomy. Social history: He states he does not drink. He smoked 20 years less than one pack per day, quitting in 2006.  Family history: Brother diabetes;he  denies family history of heart attack, stroke, or cancer.  Medications have been reviewed per MAR.  They include.  Albuterol nebulizers when necessary  Aspirin enteric-coated 81 mg daily.  Brilinta 90 mg twice a day.  Coreg 12.5 mg twice a day.  Fish oil vitamin D [phone] milligrams-daily at bedtime.  Flomax 0.4 mg take 2 daily at bedtime.  Lipitor 80 mg daily.  Losartan 100 mg daily.  Maalox when necessary every 6 hours.  Nitroglycerin when necessary.  Nystatin 1000 units-5 mL-4 times a day.  Proscarr 5 mg every PM.  Seroquel 25 mg every afternoon.  Spiriva inhaler once a day daily at bedtime.  Ventolin aerosol inhaler-every 6 hours when necessary.  T  Review of Systems  Comprehensive review of systems:Constitutional: No fever, chills, significant weight change, fatigue, weakness or night sweats Eyes: No redness, discharge, pain, blurred vision, double vision, or loss of vision ENT/mouth: No nasal  congestion, postnasal drainage,epistaxis, purulent discharge, earache, hearing loss, tinnitus ,sore throat , dental pain, or hoarseness   Cardiovascular: no chest pain, palpitations, racing, irregular rhythm, syncope, nausea, sweating, claudication, or edema  Respiratory: No cough, sputum production,hemoptysis,  dyspnea,  paroxysmal nocturnal dyspnea, pleuritic chest pain, significant snoring, or  apnea    Gastrointestinal: No heartburn,dysphagia, nausea and vomiting,ominal pain, change in bowels, anorexia, diarrhea, significant constipation, rectal bleeding, melena,  stool incontinence or jaundice Genitourinary: As noted above continues with hematuria  At times   Musculoskeletal: No myalgias or muscle cramping, joint stiffness, joint swelling, joint color change, weakness, or cyanosis Dermatologic: No rash, pruritus, urticaria, or change in color or temperature of skin Neurologic: No active headache, vertigo, limb weakness, tremor, gait disturbance, seizures, memory loss, numbness or tingling area and he states he's had some headaches in the past;  Psychiatric: No significant anxiety or depression, anhedonia, panic attacks, insomnia, or anorexia--confusion does persist Endocrine: No change in hair/skin/ nails, excessive thirst, excessive hunger, excessive urination, or unexplained fatigue Hematologic/lymphatic: Bruising on the dual anticoagulant therapy.  Allergy/immunology: No itchy/ watery eyes, abnormal sneezing, rhinitis, urticaria ,or angioedema       Objective:   Physical Exam Tissue 97.9 pulse 60 respirations 22 blood pressure recently 150/74-137/653 Physical exam:  Pertinent or positive findings: He admits pattern alopecia. He has a beard and mustache. His speech is slightly slurred but understandable   . Regular bruising is present over the forearms particularly on the right. . Foley catheter is in place. Continues to have hematuria. Was able to tell me the year initially said the month was March but was correcting this to Aprilhe did know it was the fifth of the month-he did no Trump was and Obama was expressing General appearance:Adequately nourished; no acute distress or increased work of breathing is present.   . Eyes: No conjunctival inflammation or lid edema is present. There is no scleral  icterus.   Oral exam:  lips and gums are healthy appearing.There is no oropharyngeal erythema or exudate .   Heart:   regular rhythm.  Heart sounds are distant.  Abdomen:Bowel sounds are normal. Abdomen is soft and nontender with no organomegaly, hernias  or masses. GU: Foley catheter is in place with hematuria- Extremities:  No cyanosis, edema, or clubbing noted. Neurologic exam : Cn 2-7 intact Strength equal & normal in upper & lower extremities  Skin: Warm & dry w/o tenting or jaundice. No significant lesions or rash.   Labs.  01/26/2016.  Sodium 141 potassium 4.4 BUN 17 creatinine 1.39  01/21/2016.  WBC is 10.1 hemoglobin 12.7 platelets 339  01/19/2016.  Sodium 142 potassium 3.3 BUN 14 creatinine 1.23.  WBC 13.1 hemoglobin 13.0 platelets 391.          Assessment & Plan:  #1-hypokalemia- This appears to be resolved he is on 20:meq potassium appears his potassium is steadily rising he's not on any diuretic we'll reduce his potassium dose to 10 mEq a day and recheck this first laboratory day next week  #2 history of hematuria-has had urology follow up they are aware of hematuria-will update a CBC first laboratory week as well-thoughtt f this could be trauma related from pulling on his catheter-  #3 history of leukocytosis this appears To have resolved with a white count of 10.1 on lab 31st again will update this is laboratory day next week  #4-history renal insufficiency creatinine appears to be creeping up somewhat at 1.39-I did speak with nursing as well as with patient  about trying to drink more fluids here this will need updating first laboratory day next week he does not appear grossly dehydrated certainly but this will have to be watched  [phone]

## 2016-01-27 ENCOUNTER — Non-Acute Institutional Stay (SKILLED_NURSING_FACILITY): Payer: Medicare Other | Admitting: Internal Medicine

## 2016-01-27 ENCOUNTER — Encounter: Payer: Self-pay | Admitting: Internal Medicine

## 2016-01-27 DIAGNOSIS — R319 Hematuria, unspecified: Secondary | ICD-10-CM | POA: Diagnosis not present

## 2016-01-27 DIAGNOSIS — I214 Non-ST elevation (NSTEMI) myocardial infarction: Secondary | ICD-10-CM | POA: Diagnosis not present

## 2016-01-27 DIAGNOSIS — G9340 Encephalopathy, unspecified: Secondary | ICD-10-CM

## 2016-01-27 DIAGNOSIS — J189 Pneumonia, unspecified organism: Secondary | ICD-10-CM

## 2016-01-27 DIAGNOSIS — R911 Solitary pulmonary nodule: Secondary | ICD-10-CM | POA: Diagnosis not present

## 2016-01-27 NOTE — Patient Instructions (Signed)
See summary under each active problem in the Problem List with associated updated therapeutic plan. Completed document transmitted to [facility]. [facility] Dr Dimas AguasHoward, PCP

## 2016-01-27 NOTE — Assessment & Plan Note (Signed)
Follow-up CT scan or PET scan as per Dr. Dimas AguasHoward, PCP

## 2016-01-27 NOTE — Progress Notes (Signed)
Primary care physician: Dr [name], [place], [facility] The patient is being discharged from [facility] on this date by Marga Melnick MD. The medical history in this facility was reviewed and summarized and medical problem list was updated.  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 Dual therapy with aspirin and Brillanta prescribed. In the context of BPH  urinary retention remains problematic. He is actively followed as an outpatient by Dr.Ottelin, urology. He actually saw Dr. Vernie Ammons in follow-up 01/20/16. New Foley catheter was inserted. He was to continue finasteride and tamsulosin and Alfuzosin added  with follow-up after 4 weeks for repeat voiding trial. He did have a productive cough; right lower lobe pneumonia was suggested on CT. Also a right upper lobe nodule was documented. Follow-up PET scan or CT scan was recommended He had varying degrees of delerium during hospitalization felt to be multifactorial by the neurology consultant. CT scan did reveal an old left basal ganglia lacunar infarct. Exelon was discontinued at that time; this way because of bradycardia.  He was seen by Dr.Doonquah, neurology 01/25/16 in follow-up of possible dementia. He was diagnosed with a subacute encephalopathy presumed to be multifactorial. New dysarthria and gait impairment was attributed to a subcortical infarct  Not on CT. 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 Seroquel was discontinued because of patient's hypersomnia. Follow-up in one month was recommended. Exelon was not restarted. Namenda would be considered.  Follow-up labs were 01/26/16 revealed elevation of creatinine at 1.39 and GFR of 48. His blood pressure for the most part was well controlled during his  rehabilitation at [facility] Manual BP 4/5 was 112/59. No changes were made in his antihypertensive medications.   Active or positive review of systems at discharge:He describes some residual shortness of breath but states this is much improved. He has no other active cardiopulmonary symptoms.  He does have bruising but denies other bleeding dyscrasias. He does have ongoing hematuria without other GU symptoms.   Physical examination:Pertinent or positive findings include:He has pattern alopecia. Annia Friendly and [place] are present. Slight ptosis on the left. Pupils are pin point. Complete dentures present. Heart sounds are somewhat distant. He has minor rales at the right lower lobe without increased work of breathing. Pedal pulses are slightly decreased. He has clubbing of the nailbeds. Trace edema is noted at the ankles. He has scattered bruising over the forearms. Foley with leg bag present  General appearance :adequately nourished; in no distress. Eyes: No conjunctival inflammation or scleral icterus is present. Oral exam:  Lips and gums are healthy appearing.There is no oropharyngeal erythema or exudate noted. Dental hygiene is good. Heart:  Normal rate and regular rhythm. S1 and S2 normal without gallop, murmur, click, rub or other extra sounds   Lungs: no wheezes, rhonchi,or rubs present.No increased work of breathing.  Abdomen: bowel sounds normal, soft and non-tender without masses, organomegaly or hernias noted.  No guarding or rebound.  Vascular : all pulses equal ; no bruits present. Skin:Warm & dry.  Intact without suspicious lesions or rashes ; no tenting or jaundice  Lymphatic: No lymphadenopathy is noted about the head, neck, axilla Neuro: Strength, tone  generally decreased   Please see problem list for active issues with therapeutic outpatient care plans.   Discharge instructions were  provided. Follow-up will be by the  primary care physician in seven - 14 days.

## 2016-01-27 NOTE — Assessment & Plan Note (Signed)
HEENT 01/25/16 by Dr. Kittie Plateroonquah,Neurology Dx: subacute encephalopathy, multifactorial New dysarthria and gait impairment possibly due to subcortical infarct not seen on CT imaging Ongoing encephalopathy most likely due to Alzheimer's dementia  Hypersomnia possibly due to Seroquel; Seroquel discontinued B12 level drawn Possibly restarting Exelon was to be evaluated

## 2016-01-27 NOTE — Assessment & Plan Note (Signed)
Seen 01/20/16 by Dr. Vernie Ammonsttelin Diagnosis: Acute urinary retention in the context of BPH with urinary obstruction Continued Foley catheterization recommended rather than intermittent catheterizations due to the dual anticoagulant therapy Continue finasteride and tamsulosin; add Alfuzosin 10 mg release daily Follow-up in one month

## 2016-01-27 NOTE — Assessment & Plan Note (Signed)
01/27/16 patient denies anginal symptoms He remains on dual anticoagulant therapy

## 2016-01-27 NOTE — Assessment & Plan Note (Signed)
01/27/16 patient remains afebrile; no clinical evidence of bronchitis or pneumonia

## 2016-01-28 ENCOUNTER — Observation Stay (HOSPITAL_COMMUNITY)
Admission: EM | Admit: 2016-01-28 | Discharge: 2016-02-04 | Disposition: A | Discharge status: Home / Self Care | Payer: Medicare Other | Attending: Family Medicine | Admitting: Family Medicine

## 2016-01-28 ENCOUNTER — Other Ambulatory Visit (HOSPITAL_COMMUNITY): Payer: Self-pay

## 2016-01-28 ENCOUNTER — Other Ambulatory Visit: Payer: Self-pay

## 2016-01-28 ENCOUNTER — Encounter (HOSPITAL_COMMUNITY): Payer: Self-pay | Admitting: *Deleted

## 2016-01-28 ENCOUNTER — Non-Acute Institutional Stay (SKILLED_NURSING_FACILITY): Payer: Medicare Other | Admitting: Internal Medicine

## 2016-01-28 ENCOUNTER — Encounter (HOSPITAL_COMMUNITY)
Admission: RE | Admit: 2016-01-28 | Discharge: 2016-01-28 | Disposition: A | Discharge status: Home / Self Care | Payer: Medicare Other | Source: Skilled Nursing Facility | Attending: Internal Medicine | Admitting: Internal Medicine

## 2016-01-28 ENCOUNTER — Emergency Department (HOSPITAL_COMMUNITY): Payer: Medicare Other

## 2016-01-28 ENCOUNTER — Encounter: Payer: Self-pay | Admitting: Internal Medicine

## 2016-01-28 DIAGNOSIS — F0390 Unspecified dementia without behavioral disturbance: Secondary | ICD-10-CM | POA: Insufficient documentation

## 2016-01-28 DIAGNOSIS — D649 Anemia, unspecified: Secondary | ICD-10-CM

## 2016-01-28 DIAGNOSIS — Z7902 Long term (current) use of antithrombotics/antiplatelets: Secondary | ICD-10-CM | POA: Diagnosis not present

## 2016-01-28 DIAGNOSIS — R310 Gross hematuria: Secondary | ICD-10-CM | POA: Diagnosis not present

## 2016-01-28 DIAGNOSIS — E785 Hyperlipidemia, unspecified: Secondary | ICD-10-CM | POA: Diagnosis not present

## 2016-01-28 DIAGNOSIS — N400 Enlarged prostate without lower urinary tract symptoms: Secondary | ICD-10-CM | POA: Diagnosis not present

## 2016-01-28 DIAGNOSIS — I2510 Atherosclerotic heart disease of native coronary artery without angina pectoris: Secondary | ICD-10-CM | POA: Diagnosis not present

## 2016-01-28 DIAGNOSIS — Z66 Do not resuscitate: Secondary | ICD-10-CM | POA: Insufficient documentation

## 2016-01-28 DIAGNOSIS — R001 Bradycardia, unspecified: Secondary | ICD-10-CM | POA: Insufficient documentation

## 2016-01-28 DIAGNOSIS — I214 Non-ST elevation (NSTEMI) myocardial infarction: Secondary | ICD-10-CM | POA: Insufficient documentation

## 2016-01-28 DIAGNOSIS — Z955 Presence of coronary angioplasty implant and graft: Secondary | ICD-10-CM | POA: Insufficient documentation

## 2016-01-28 DIAGNOSIS — D62 Acute posthemorrhagic anemia: Secondary | ICD-10-CM | POA: Diagnosis not present

## 2016-01-28 DIAGNOSIS — I472 Ventricular tachycardia: Secondary | ICD-10-CM | POA: Insufficient documentation

## 2016-01-28 DIAGNOSIS — R079 Chest pain, unspecified: Secondary | ICD-10-CM | POA: Diagnosis not present

## 2016-01-28 DIAGNOSIS — N170 Acute kidney failure with tubular necrosis: Secondary | ICD-10-CM | POA: Insufficient documentation

## 2016-01-28 DIAGNOSIS — J45909 Unspecified asthma, uncomplicated: Secondary | ICD-10-CM | POA: Insufficient documentation

## 2016-01-28 DIAGNOSIS — Z9889 Other specified postprocedural states: Secondary | ICD-10-CM | POA: Insufficient documentation

## 2016-01-28 DIAGNOSIS — Z7982 Long term (current) use of aspirin: Secondary | ICD-10-CM | POA: Insufficient documentation

## 2016-01-28 DIAGNOSIS — N390 Urinary tract infection, site not specified: Secondary | ICD-10-CM | POA: Diagnosis not present

## 2016-01-28 DIAGNOSIS — I517 Cardiomegaly: Secondary | ICD-10-CM | POA: Diagnosis not present

## 2016-01-28 DIAGNOSIS — Z87891 Personal history of nicotine dependence: Secondary | ICD-10-CM | POA: Diagnosis not present

## 2016-01-28 DIAGNOSIS — I252 Old myocardial infarction: Secondary | ICD-10-CM | POA: Insufficient documentation

## 2016-01-28 DIAGNOSIS — N179 Acute kidney failure, unspecified: Secondary | ICD-10-CM

## 2016-01-28 DIAGNOSIS — R5383 Other fatigue: Secondary | ICD-10-CM | POA: Diagnosis not present

## 2016-01-28 DIAGNOSIS — J449 Chronic obstructive pulmonary disease, unspecified: Secondary | ICD-10-CM | POA: Diagnosis not present

## 2016-01-28 DIAGNOSIS — I10 Essential (primary) hypertension: Secondary | ICD-10-CM | POA: Diagnosis not present

## 2016-01-28 DIAGNOSIS — Z833 Family history of diabetes mellitus: Secondary | ICD-10-CM | POA: Insufficient documentation

## 2016-01-28 DIAGNOSIS — E43 Unspecified severe protein-calorie malnutrition: Secondary | ICD-10-CM | POA: Diagnosis not present

## 2016-01-28 DIAGNOSIS — R0789 Other chest pain: Secondary | ICD-10-CM | POA: Diagnosis not present

## 2016-01-28 DIAGNOSIS — I4510 Unspecified right bundle-branch block: Secondary | ICD-10-CM | POA: Diagnosis not present

## 2016-01-28 DIAGNOSIS — R911 Solitary pulmonary nodule: Secondary | ICD-10-CM | POA: Insufficient documentation

## 2016-01-28 HISTORY — DX: Elevated prostate specific antigen (PSA): R97.20

## 2016-01-28 LAB — CBC WITH DIFFERENTIAL/PLATELET
Basophils Absolute: 0 10*3/uL (ref 0.0–0.1)
Basophils Relative: 0 %
Eosinophils Absolute: 0.9 10*3/uL — ABNORMAL HIGH (ref 0.0–0.7)
Eosinophils Relative: 8 %
HCT: 20.8 % — ABNORMAL LOW (ref 39.0–52.0)
HEMOGLOBIN: 7.1 g/dL — AB (ref 13.0–17.0)
LYMPHS ABS: 1.5 10*3/uL (ref 0.7–4.0)
LYMPHS PCT: 13 %
MCH: 30.1 pg (ref 26.0–34.0)
MCHC: 34.1 g/dL (ref 30.0–36.0)
MCV: 88.1 fL (ref 78.0–100.0)
MONOS PCT: 9 %
Monocytes Absolute: 1.1 10*3/uL — ABNORMAL HIGH (ref 0.1–1.0)
NEUTROS PCT: 70 %
Neutro Abs: 8.1 10*3/uL — ABNORMAL HIGH (ref 1.7–7.7)
Platelets: 300 10*3/uL (ref 150–400)
RBC: 2.36 MIL/uL — AB (ref 4.22–5.81)
RDW: 14.1 % (ref 11.5–15.5)
WBC: 11.6 10*3/uL — AB (ref 4.0–10.5)

## 2016-01-28 LAB — BASIC METABOLIC PANEL
ANION GAP: 8 (ref 5–15)
Anion gap: 7 (ref 5–15)
BUN: 28 mg/dL — ABNORMAL HIGH (ref 6–20)
BUN: 28 mg/dL — AB (ref 6–20)
CALCIUM: 8.7 mg/dL — AB (ref 8.9–10.3)
CHLORIDE: 103 mmol/L (ref 101–111)
CO2: 24 mmol/L (ref 22–32)
CO2: 23 mmol/L (ref 22–32)
CREATININE: 2.19 mg/dL — AB (ref 0.61–1.24)
Calcium: 8.5 mg/dL — ABNORMAL LOW (ref 8.9–10.3)
Chloride: 107 mmol/L (ref 101–111)
Creatinine, Ser: 2.4 mg/dL — ABNORMAL HIGH (ref 0.61–1.24)
GFR calc Af Amer: 29 mL/min — ABNORMAL LOW (ref >60)
GFR calc Af Amer: 32 mL/min — ABNORMAL LOW (ref >60)
GFR calc non Af Amer: 25 mL/min — ABNORMAL LOW (ref >60)
GFR calc non Af Amer: 28 mL/min — ABNORMAL LOW (ref >60)
GLUCOSE: 116 mg/dL — AB (ref 65–99)
GLUCOSE: 124 mg/dL — AB (ref 65–99)
POTASSIUM: 4.5 mmol/L (ref 3.5–5.1)
Potassium: 4.7 mmol/L (ref 3.5–5.1)
Sodium: 139 mmol/L (ref 135–145)
Sodium: 133 mmol/L — ABNORMAL LOW (ref 135–145)

## 2016-01-28 LAB — URINE MICROSCOPIC-ADD ON

## 2016-01-28 LAB — POC OCCULT BLOOD, ED: Fecal Occult Bld: NEGATIVE

## 2016-01-28 LAB — URINALYSIS, ROUTINE W REFLEX MICROSCOPIC
Bilirubin Urine: NEGATIVE
GLUCOSE, UA: 250 mg/dL — AB
Ketones, ur: 40 mg/dL — AB
Nitrite: POSITIVE — AB
Specific Gravity, Urine: <1.005 — ABNORMAL LOW (ref 1.005–1.030)
pH: 7 (ref 5.0–8.0)

## 2016-01-28 LAB — APTT: aPTT: 28 seconds (ref 24–37)

## 2016-01-28 LAB — PROTIME-INR
INR: 1.12 (ref 0.00–1.49)
PROTHROMBIN TIME: 14.6 s (ref 11.6–15.2)

## 2016-01-28 LAB — PREPARE RBC (CROSSMATCH)

## 2016-01-28 LAB — PROCALCITONIN

## 2016-01-28 LAB — TROPONIN I

## 2016-01-28 LAB — MRSA PCR SCREENING: MRSA BY PCR: NEGATIVE

## 2016-01-28 LAB — ABO/RH: ABO/RH(D): O POS

## 2016-01-28 MED ORDER — SODIUM CHLORIDE 0.9% FLUSH
3.0000 mL | Freq: Two times a day (BID) | INTRAVENOUS | Status: DC
  Administered 2016-01-28 – 2016-02-03 (×9): 3 mL via INTRAVENOUS

## 2016-01-28 MED ORDER — FINASTERIDE 5 MG PO TABS
ORAL_TABLET | ORAL | Status: AC
  Filled 2016-01-28: qty 1

## 2016-01-28 MED ORDER — ALBUTEROL SULFATE (2.5 MG/3ML) 0.083% IN NEBU
2.5000 mg | INHALATION_SOLUTION | RESPIRATORY_TRACT | Status: DC | PRN
Start: 2016-01-28 — End: 2016-02-04
  Administered 2016-01-30: 2.5 mg via RESPIRATORY_TRACT
  Filled 2016-01-28: qty 3

## 2016-01-28 MED ORDER — NITROGLYCERIN 0.4 MG SL SUBL: 0.4000 mg | SUBLINGUAL_TABLET | SUBLINGUAL | Status: DC | PRN

## 2016-01-28 MED ORDER — ALBUTEROL SULFATE HFA 108 (90 BASE) MCG/ACT IN AERS
2.0000 | INHALATION_SPRAY | Freq: Four times a day (QID) | RESPIRATORY_TRACT | Status: DC | PRN
  Filled 2016-01-28: qty 6.7

## 2016-01-28 MED ORDER — ENSURE ENLIVE PO LIQD
237.0000 mL | Freq: Two times a day (BID) | ORAL | Status: DC
  Administered 2016-02-01 – 2016-02-04 (×5): 237 mL via ORAL

## 2016-01-28 MED ORDER — FINASTERIDE 5 MG PO TABS
5.0000 mg | ORAL_TABLET | Freq: Every day | ORAL | Status: DC
  Administered 2016-01-29 – 2016-02-04 (×7): 5 mg via ORAL
  Filled 2016-01-28 (×11): qty 1

## 2016-01-28 MED ORDER — ONDANSETRON HCL 4 MG PO TABS: 4.0000 mg | ORAL_TABLET | Freq: Four times a day (QID) | ORAL | Status: DC | PRN

## 2016-01-28 MED ORDER — TAMSULOSIN HCL 0.4 MG PO CAPS
0.8000 mg | ORAL_CAPSULE | Freq: Every day | ORAL | Status: DC
  Administered 2016-01-28 – 2016-02-03 (×7): 0.8 mg via ORAL
  Filled 2016-01-28 (×8): qty 2

## 2016-01-28 MED ORDER — BISACODYL 5 MG PO TBEC: 5.0000 mg | DELAYED_RELEASE_TABLET | Freq: Every day | ORAL | Status: DC | PRN

## 2016-01-28 MED ORDER — POLYETHYLENE GLYCOL 3350 17 G PO PACK: 17.0000 g | PACK | Freq: Every day | ORAL | Status: DC | PRN

## 2016-01-28 MED ORDER — ATORVASTATIN CALCIUM 40 MG PO TABS
80.0000 mg | ORAL_TABLET | Freq: Every day | ORAL | Status: DC
  Administered 2016-01-28 – 2016-02-03 (×7): 80 mg via ORAL
  Filled 2016-01-28 (×3): qty 2
  Filled 2016-01-28 (×2): qty 1
  Filled 2016-01-28: qty 2
  Filled 2016-01-28: qty 1
  Filled 2016-01-28 (×3): qty 2

## 2016-01-28 MED ORDER — ALUM & MAG HYDROXIDE-SIMETH 200-200-20 MG/5ML PO SUSP: 30.0000 mL | Freq: Four times a day (QID) | ORAL | Status: DC | PRN

## 2016-01-28 MED ORDER — SODIUM CHLORIDE 0.9 % IR SOLN: 3000.0000 mL | Status: DC | PRN

## 2016-01-28 MED ORDER — ACETAMINOPHEN 325 MG PO TABS
650.0000 mg | ORAL_TABLET | Freq: Four times a day (QID) | ORAL | Status: DC | PRN
  Administered 2016-02-02: 650 mg via ORAL
  Filled 2016-01-28: qty 2

## 2016-01-28 MED ORDER — ONDANSETRON HCL 4 MG/2ML IJ SOLN: 4.0000 mg | Freq: Four times a day (QID) | INTRAMUSCULAR | Status: DC | PRN

## 2016-01-28 MED ORDER — SODIUM CHLORIDE 0.9 % IV SOLN
Freq: Once | INTRAVENOUS | Status: AC
  Administered 2016-01-28: 20:00:00 via INTRAVENOUS

## 2016-01-28 MED ORDER — HYDROCODONE-ACETAMINOPHEN 5-325 MG PO TABS
1.0000 | ORAL_TABLET | ORAL | Status: DC | PRN
  Administered 2016-02-03 (×2): 2 via ORAL
  Filled 2016-01-28 (×2): qty 2

## 2016-01-28 MED ORDER — TIOTROPIUM BROMIDE MONOHYDRATE 18 MCG IN CAPS
1.0000 | ORAL_CAPSULE | Freq: Every day | RESPIRATORY_TRACT | Status: DC
Start: 2016-01-28 — End: 2016-02-04
  Administered 2016-01-30 – 2016-02-04 (×6): 18 ug via RESPIRATORY_TRACT
  Filled 2016-01-28 (×2): qty 5

## 2016-01-28 MED ORDER — SODIUM CHLORIDE 0.9 % IV SOLN
INTRAVENOUS | Status: AC
  Administered 2016-01-28: 20:00:00 via INTRAVENOUS

## 2016-01-28 NOTE — ED Notes (Signed)
Unable to obtain EKG. Instructed by nurse to wait and repeat in a little while.

## 2016-01-28 NOTE — ED Notes (Signed)
[facility] comes in [facility]. Per Kathie RhodesBetty at facility, pt has dementia but this has worsened over the last day. Today pt went to therapy and began to have chest pain. They did an EKG there but was advised to send him here for further eval. In addition, facility states that his BP has been lower than norm, last value was 88/42 while his normal BP is 150/88. Also, pt had increased creatinine levels. Pt is alert at this time.

## 2016-01-28 NOTE — ED Provider Notes (Signed)
CSN: [id number]     Arrival date & time 01/28/16  1524 History   First MD Initiated Contact with Patient 01/28/16 1526     Chief Complaint  Patient presents with  . Chest Pain     (Consider location/radiation/quality/duration/timing/severity/associated sxs/prior Treatment) HPI..... Level V caveat for mild dementia. Patient is status post MI on December 31, 2015 with [facility]. He is now in rehabilitation locally. Today he had chest pain after physical therapy. He has had persistent hematuria secondary to his anticoagulation medicine. No dyspnea, diaphoresis, nausea. Family reports that he has not been acting normally last couple days  Past Medical History  Diagnosis Date  . Ulcer   . Asthma   . Essential hypertension   . NSTEMI (non-ST elevated myocardial infarction) (HCC) 12/31/2015    Echo 01/01/16 (pre-CATH-PCI): EF 60-65%, no RWMA.   . 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 Moderate OM1 35% & midLAD 45%  . 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 Dyslipidemia, goal LDL below 70 01/02/2016  . COPD (chronic obstructive pulmonary disease) (HCC) 01/01/2016  . Lung nodule 01/01/2016  . Dementia   . BPH (benign prostatic hypertrophy) with urinary obstruction   . Acute urinary retention    Past Surgical History  Procedure Laterality Date  . Hernia repair    . Cardiac catheterization N/A 01/01/2016    Procedure: Left Heart Cath and Coronary Angiography;  Surgeon: Marykay Lexavid W Harding, MD;  [facility];  Service: Cardiovascular;  Laterality: N/A;  . Cardiac catheterization Right 01/01/2016    Procedure: Coronary Stent Intervention;  Surgeon: Marykay Lexavid W Harding, MD;  [facility];  Service: Cardiovascular;  Laterality: Right;  . Tonsillectomy     Family History  Problem Relation Age of Onset  . Diabetes Brother   . Cancer Neg Hx   . Stroke Neg Hx   . Heart disease Neg Hx    Social History  Substance Use Topics  . Smoking status: Former Smoker    Quit date: 10/23/2004  . Smokeless tobacco: None     Comment: Less than 1 pack per day 20 years  . Alcohol Use: No    Review of Systems  Reason unable to perform ROS: Mild dementia.      Allergies  Asa  Home Medications   Prior to Admission medications   Medication Sig Start Date End Date Taking? Authorizing Provider  acetaminophen (TYLENOL) 325 MG tablet Take 650 mg by mouth every 6 (six) hours as needed for moderate pain.    Historical Provider, MD  albuterol (PROVENTIL) (2.5 MG/3ML) 0.083% nebulizer solution Take 3 mLs (2.5 mg total) by nebulization every 2 (two) hours as needed for wheezing. 01/11/16   Belkys A Regalado, MD  alum & mag hydroxide-simeth (MAALOX/MYLANTA) 200-200-20 MG/5ML suspension Take 30 mLs by mouth every 6 (six) hours as needed for indigestion or heartburn. 01/11/16   Belkys A Regalado, MD  aspirin EC 81 MG EC tablet Take 1 tablet (81 mg total) by mouth daily. 01/11/16   Belkys A Regalado, MD  atorvastatin (LIPITOR) 80 MG tablet Take 1 tablet (80 mg total) by mouth daily at 6 PM. 01/11/16  Belkys A Regalado, MD  carvedilol (COREG) 12.5 MG tablet Take 1 tablet (12.5 mg total) by mouth 2 (two) times daily with a meal. 01/11/16   Belkys A Regalado, MD  finasteride (PROSCAR) 5 MG tablet Take 5 mg by mouth daily.  06/09/14   Historical Provider, MD  Fish Oil-Cholecalciferol (FISH OIL + D3 PO) Take 1 capsule by mouth at bedtime.    Historical Provider, MD  losartan (COZAAR) 100 MG tablet Take 1 tablet (100 mg total) by mouth daily. 01/11/16   Belkys A Regalado, MD  nitroGLYCERIN (NITROSTAT) 0.4 MG SL tablet Place 1 tablet (0.4 mg total) under the tongue every 5 (five) minutes as needed for  chest pain. 01/11/16   Belkys A Regalado, MD  nystatin (MYCOSTATIN) 100000 UNIT/ML suspension Administer 5 cc by mouth four times a day for coated tongue    Historical Provider, MD  potassium chloride (K-DUR) 10 MEQ tablet Take 10 mEq by mouth daily.    Historical Provider, MD  SPIRIVA HANDIHALER 18 MCG inhalation capsule Place 1 capsule into inhaler and inhale daily. 12/03/15   Historical Provider, MD  tamsulosin (FLOMAX) 0.4 MG CAPS capsule Take 0.8 mg by mouth at bedtime.  06/09/14   Historical Provider, MD  ticagrelor (BRILINTA) 90 MG TABS tablet Take 1 tablet (90 mg total) by mouth 2 (two) times daily. 01/11/16   Belkys A Regalado, MD  VENTOLIN HFA 108 (90 BASE) MCG/ACT inhaler Inhale 2 puffs into the lungs every 6 (six) hours as needed for wheezing or shortness of breath.  06/30/14   Historical Provider, MD   BP 121/52 mmHg  Pulse 64  Temp(Src) 98.4 F (36.9 C) (Oral)  Resp 21  Ht  (1.727 m)  Wt 190 lb (86.183 kg)  BMI 28.90 kg/m2  SpO2 98% Physical Exam  Constitutional: He is oriented to person, place, and time.  Pale, confused  HENT:  Head: Normocephalic and atraumatic.  Eyes: Conjunctivae and EOM are normal. Pupils are equal, round, and reactive to light.  Neck: Normal range of motion. Neck supple.  Cardiovascular: Normal rate and regular rhythm.   Pulmonary/Chest: Effort normal and breath sounds normal.  Abdominal: Soft. Bowel sounds are normal.  Genitourinary:  Rectal exam: No masses. Heme negative  Musculoskeletal: Normal range of motion.  Neurological: He is alert and oriented to person, place, and time.  Skin: Skin is warm and dry.  Psychiatric: He has a normal mood and affect. His behavior is normal.  Nursing note and vitals reviewed.   ED Course  Procedures (including critical care time) Labs Review Labs Reviewed  CBC WITH DIFFERENTIAL/PLATELET - Abnormal; Notable for the following:    WBC 11.6 (*)    RBC 2.36 (*)    Hemoglobin 7.1 (*)    HCT 20.8 (*)     Neutro Abs 8.1 (*)    Monocytes Absolute 1.1 (*)    Eosinophils Absolute 0.9 (*)    All other components within normal limits  BASIC METABOLIC PANEL - Abnormal; Notable for the following:    Sodium 133 (*)    Glucose, Bld 124 (*)    BUN 28 (*)    Creatinine, Ser 2.19 (*)    Calcium 8.5 (*)    GFR calc non Af Amer 28 (*)    GFR calc Af Amer 32 (*)    All other components within normal limits  TROPONIN I  POC OCCULT BLOOD, ED    Imaging Review Ct Head Wo Contrast  01/28/2016  CLINICAL DATA:  Worsening dementia x 1 day.  Hx of HTN,CAD,COPD EXAM: CT HEAD WITHOUT CONTRAST TECHNIQUE: Contiguous axial images were obtained from the base of the skull through the vertex without intravenous contrast. COMPARISON:  01/10/2016 Switch FINDINGS: The ventricles are normal in size, for the patient's age, and normal in configuration. There are no parenchymal masses or mass effect. There is no evidence of a cortical infarct. Not mild periventricular white matter hypoattenuation is noted consistent chronic microvascular ischemic change. There are no extra-axial masses or abnormal fluid collections. There is no intracranial hemorrhage. The visualized sinuses and mastoid air cells are clear. No skull lesion. Stable appearance from the prior head CT. IMPRESSION: 1. No acute intracranial abnormalities. 2. Age-appropriate volume loss. Mild chronic microvascular ischemic change. Electronically Signed   By: Amie Portland M.D.   On: 01/28/2016 16:37   Dg Chest Portable 1 View  01/28/2016  CLINICAL DATA:  75 year old male with increased chest pain, blood pressure running low. Initial encounter. EXAM: PORTABLE CHEST 1 VIEW COMPARISON:  01/05/2016 and earlier. FINDINGS: Portable AP upright view at 1542 hours. Stable lung volumes. Stable mild cardiomegaly. Allowing for portable technique, the lungs are clear. No pneumothorax. IMPRESSION: No acute cardiopulmonary abnormality. Electronically Signed   By: Odessa Fleming M.D.   On:  01/28/2016 15:57   I have personally reviewed and evaluated these images and lab results as part of my medical decision-making.   EKG Interpretation None     CRITICAL CARE Performed by: Donnetta Hutching Total critical care time: 30 minutes Critical care time was exclusive of separately billable procedures and treating other patients. Critical care was necessary to treat or prevent imminent or life-threatening deterioration. Critical care was time spent personally by me on the following activities: development of treatment plan with patient and/or surrogate as well as nursing, discussions with consultants, evaluation of patient's response to treatment, examination of patient, obtaining history from patient or surrogate, ordering and performing treatments and interventions, ordering and review of laboratory studies, ordering and review of radiographic studies, pulse oximetry and re-evaluation of patient's condition. MDM   Final diagnoses:  Chest pain, unspecified chest pain type  Anemia, unspecified anemia type    Hemoglobin has dropped significantly in the past week from 12.7 to 7.1.    This could be contributing to some ischemia and subsequent chest pain. Rectal exam negative for blood. Suspect Brilinta as etiology for bleeding.  Admit to stepdown.    Donnetta Hutching, MD 01/28/16 [phone]

## 2016-01-28 NOTE — Progress Notes (Signed)
Patient ID: Lee Chang, male   DOB: 08/13/1941, 75 y.o.   MRN: [id number]  Location:  [facility]   Place of Service:  SNF (31)   [name], MD  Patient Care Team: [name], MD as PCP - General [facility])  Extended Emergency Contact Information Primary Emergency Contact: Pruitt,Denise  [place] Home Phone: [phone] Relation: Daughter Secondary Emergency Contact: [name] Home Phone: [phone] Relation: Son  Goals of care: Advanced Directive information Advanced Directives 01/28/2016  Does patient have an advance directive? Yes  Type of Advance Directive Out of facility DNR (pink MOST or yellow form)  Does patient want to make changes to advanced directive? No - Patient declined  Copy of advanced directive(s) in chart? Yes  Would patient like information on creating an advanced directive? -     Chief Complaint  Patient presents with  . Acute Visit  Secondary to chest pain-renal insufficiency-weakness  HPI:  Pt is a 75 y.o. male seen today for an acute visit for chest pain during therapy-this was apparently of short duration.  Also noted to have worsening renal insufficiency with a creatinine of 2.4 on lab done today previous creatinines have been in the low to mid ones.  He does have a history of urinary retention has a Foley catheter that has been draining hematuria and here for some time-he is followed by urology and has been started on Proscar and Flomax with close follow-up by urology.  Most acute issue this afternoon was as short episode of chest pain during therapy-he is status post non-ST MI approximately a month ago and did have stents.  He is on Brilanta and ASA--other than a somewhat soft systolic pressure in the 90s his vital signs were stable after the episode in fact per serial exams he said the pain was essentially gone-we did do an EKG which did not appear to show an acute  process.  However certainly chest pain is concerning and it was decided to send him to the ER.  This is complicated again with what appears to be fairly significantly worsening renal insufficiency and continued hematuria.  Nursing set does report he appears to have been increasingly weak here especially the last couple days-vital signs have been relatively stable although at times systolic pressure is slightly below 100.  He is not really complaining of anything acute today other again in the chest pain during therapy he did not complain of any shortness of breath with this diaphoresis or radiation of the pain   Past Medical History  Diagnosis Date  . Ulcer   . Asthma   . Essential hypertension   . NSTEMI (non-ST elevated myocardial infarction) (HCC) 12/31/2015    Echo 01/01/16 (pre-CATH-PCI): EF 60-65%, no RWMA.   . 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 Moderate OM1 35% & midLAD 45%  . 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 Dyslipidemia, goal LDL below 70 01/02/2016  . COPD (chronic obstructive pulmonary disease) (HCC) 01/01/2016  . Lung nodule 01/01/2016  . Dementia   . BPH (benign prostatic hypertrophy) with urinary obstruction   . Acute urinary retention    Past Surgical History  Procedure Laterality Date  . Hernia repair    . Cardiac catheterization N/A 01/01/2016    Procedure: Left Heart Cath and Coronary Angiography;  Surgeon: Marykay Lexavid W Harding, MD;  [facility];  Service: Cardiovascular;  Laterality: N/A;  . Cardiac catheterization Right 01/01/2016    Procedure: Coronary Stent Intervention;  Surgeon: Marykay Lexavid W Harding, MD;  [facility];  Service: Cardiovascular;  Laterality:  Right;  . Tonsillectomy      Allergies  Allergen Reactions  . Asa [Aspirin]     Bleeding ulcer prior to 2007   Medications. Tylenol 650 mg every 6 hours when necessary.  Albuterol nebulizers every 2 hours when necessary.  Maalox every 6 hours when necessary.  Aspirin 81 mg enteric-coated daily.  Lipitor 80 mg daily.  Coreg 12.5 mg twice a day.  Post car 5 mg by mouth daily.  Fish well daily.  Cozaar 100 mg daily.  Neck blister when necessary.  Testing 10 mEq daily.  Spiriva inhaler 18 g daily.  Flomax 0.4 mg daily.  Brilanta mg twice a day.  Ventolin inhaler every 6 hours when necessary      Review of Systems somewhat limited since patient is a poor historian.  In general does not complain of fever or chills or sick staff says he appears to be increasingly weak.  Skin does not complain of rashes or itching has chronic bruising diffuse.  Head ears eyes nose mouth and throat does not complain of visual changes or sore throat.  Respiratory is not complaining of shortness of breath or cough.  Cardiac extensive history here as noted above did complain of some chest pain without radiation during therapy apparently that is easing off now.  GI is not complaining of nausea vomiting diarrhea or constipation.  Muscle skeletal is not complaining of joint pain again nursing staff has noted weakness.  GU again extensive history with hematuria that appears to be fairly persistent does have a chronic indwelling catheter.  Neurologic is not complaining of dizziness or headache or syncopal-type feelings.  In psych does have some history of confusion but is pleasant at his baseline.     There is no immunization history on file for this patient. Pertinent  Health Maintenance Due  Topic Date Due  . COLONOSCOPY  12/17/1990  . PNA vac Low Risk Adult (1 of 2 - PCV13) 12/17/2005  . INFLUENZA VACCINE  05/23/2016   No flowsheet data found. Functional Status  Survey:    Filed Vitals:   01/28/16 1219  BP: 110/58  Pulse: 62  Temp: 98.4 F (36.9 C)  TempSrc: Oral  Resp: 20  Height: 5\' 8"  (1.727 m)  Weight: 185 lb 3.2 oz (84.006 kg)  Of Zoloft updated blood pressure status post chest pain systolic appears to be in the higher 90s Body mass index is 28.17 kg/(m^2). Physical Exam   In general this is a pleasant elderly male in no distress sitting comfortably in his wheelchair.  His skin is warm and dry is not diaphoretic.  Eyes pupils appear reactive to light visual acuity appears grossly intact.  Oropharynx clear mucous membranes moist.  Chest is clear to auscultation there is no labored breathing.  Heart is regular rate and rhythm without murmur gallop or rub-she does not appear to have significant lower extremity edema.  Abdomen is soft does not appear to be tender there are positive bowel sounds.  GU he does have catheter draining hematuria.  Muscle skeletal ambulates in a  wheelchair able to move his extremities at baseline I don't note any changes from baseline.  Neurologic is grossly intact to speech is clear no lateralizing findings.  Psych continues to be confused but pleasant and answers simple questions appropriately this is his baseline.    Labs reviewed:  Recent Labs  01/21/16 0755 01/26/16 0707 01/28/16 0710  NA 142 141 139  K 3.6 4.4 4.7  CL 107 110 107  CO2 GLUCOSE 104* 107* 116*  BUN 14 17 28*  CREATININE 1.19 1.39* 2.40*  CALCIUM 9.0 8.7* 8.7*    Recent Labs  01/03/16 0049 01/05/16 0853 01/07/16 0530  AST 58* 26 15  ALT 24 18 15*  ALKPHOS 59 49 40  BILITOT 1.4* 0.9 0.7  PROT 6.3* 5.9* 5.3*  ALBUMIN 3.5 3.2* 2.7*    Recent Labs  01/12/16 0710 01/19/16 0715 01/21/16 0755  WBC 19.5* 13.1* 10.1  NEUTROABS 14.5* 9.5* 7.1  HGB 14.5 13.0 12.7*  HCT 42.8 40.1 38.7*  MCV 89.2 90.3 90.0  PLT 283 341 339   Lab Results  Component Value Date   TSH 1.717 01/01/2016   Lab Results   Component Value Date   HGBA1C 6.3* 01/01/2016   No results found for: CHOL, HDL, LDLCALC, LDLDIRECT, TRIG, CHOLHDL  Significant Diagnostic Results in last 30 days:  Dg Chest 1 View  01/05/2016  CLINICAL DATA:  Leukocytosis.  Confusion. EXAM: CHEST 1 VIEW COMPARISON:  01/03/2016 FINDINGS: Single view of the chest demonstrates prominent lung markings which are unchanged and appear to be chronic. There is no focal airspace disease or pulmonary edema. Heart and mediastinum are within normal limits and stable. Osseous structures appear to be intact. IMPRESSION: No acute chest findings. Electronically Signed   By: Richarda Overlie M.D.   On: 01/05/2016 10:06   Ct Head Wo Contrast  01/10/2016  CLINICAL DATA:  Confusion EXAM: CT HEAD WITHOUT CONTRAST TECHNIQUE: Contiguous axial images were obtained from the base of the skull through the vertex without intravenous contrast. COMPARISON:  01/03/2016 FINDINGS: The bony calvarium is intact. No gross soft tissue abnormality is noted. Mild atrophic changes are seen. A small lacunar infarct is noted in the left basal ganglia posterior laterally. No acute hemorrhage, acute infarction or space-occupying mass lesion is noted. IMPRESSION: No acute abnormality noted. Electronically Signed   By: Alcide Clever M.D.   On: 01/10/2016 16:11   Ct Head Wo Contrast  01/03/2016  CLINICAL DATA:  Delirium, altered mental status. Recent myocardial infarction, history of hypertension and dyslipidemia. EXAM: CT HEAD WITHOUT CONTRAST TECHNIQUE: Contiguous axial images were obtained from the base of the skull through the vertex without intravenous contrast. COMPARISON:  MRI of the head August 20, 2015 FINDINGS: The ventricles and sulci are normal for age. No intraparenchymal hemorrhage, mass effect nor midline shift. Patchy supratentorial white matter hypodensities are less than expected for patient's age and though non-specific suggest sequelae of chronic small vessel ischemic disease. No  acute large vascular territory infarcts. Old LEFT basal ganglia lacunar infarct. No abnormal extra-axial fluid collections. Basal cisterns are patent. 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 No skull fracture. The included ocular globes and orbital contents are non-suspicious. Status post bilateral ocular lens implants. Mild paranasal sinus mucosal thickening. Mastoid air cells are well aerated. Patient is edentulous. IMPRESSION: No acute intracranial process. Involutional changes and minimal chronic small vessel ischemic disease. Old LEFT basal ganglia lacunar infarct. Electronically Signed   By: Michel Santee.D.  On: 01/03/2016 01:46   Dg Chest Port 1 View  01/03/2016  CLINICAL DATA:  Hypoxemia, confusion, fever, asthma, essential hypertension, coronary artery disease post NSTEMI, COPD EXAM: PORTABLE CHEST 1 VIEW COMPARISON:  Portable exam 1007 hours compared to 08/18/2015 FINDINGS: Normal heart size, mediastinal contours, and pulmonary vascularity. Bronchitic changes accentuated interstitial markings. Bibasilar atelectasis versus infiltrate. Upper lungs clear. No pleural effusion or pneumothorax. IMPRESSION: Bronchitic changes with bibasilar atelectasis versus infiltrate. Electronically Signed   By: Ulyses Southward M.D.   On: 01/03/2016 10:19   Ct Angio Chest Aorta W/cm &/or Wo/cm  01/01/2016  CLINICAL DATA:  Sudden onset of central chest pain radiating to the back. Rule out dissection. EXAM: CT ANGIOGRAPHY CHEST, ABDOMEN AND PELVIS TECHNIQUE: Multidetector CT imaging through the chest, abdomen and pelvis was performed using the standard protocol during bolus administration of intravenous contrast. Multiplanar reconstructed images and MIPs were obtained and reviewed to evaluate the vascular anatomy. CONTRAST:  OMNIPAQUE IOHEXOL 350 MG/ML SOLN COMPARISON:  None. FINDINGS: CTA CHEST FINDINGS Normal caliber thoracic aorta without dissection, aneurysm, hematoma or acute aortic  syndrome. Mild atherosclerosis. Conventional branching pattern from the aortic arch. No filling defects in the pulmonary arteries to the segmental level. Heart is normal in size. Coronary artery calcification versus stent. Scattered normal size mediastinal nodes, no pathologic adenopathy. No hilar adenopathy. Lobular right apical nodule measures 1.5 x 1.3 x 2.2 cm with minimal surrounding spiculation. No additional suspicious pulmonary nodule. Faint reticular nodular subpleural opacity in the anterior right upper lobe likely infectious or inflammatory. No consolidation or pulmonary edema. Sub cm lucency within T4 vertebral body, nonspecific. No sclerotic lesions. No acute osseous abnormality. Review of the MIP images confirms the above findings. CTA ABDOMEN AND PELVIS FINDINGS Normal caliber abdominal aorta without aneurysm, dissection, hematoma or acute aortic abnormality. Moderate diffuse atherosclerosis. Celiac, superior mesenteric, and inferior mesenteric arteries are patent. Single bilateral renal arteries are patent. Atherosclerosis of both iliac arteries without significant stenosis. Arterial phase imaging of the liver, spleen, and pancreas are normal. No adrenal nodule. Symmetric renal enhancement. Exophytic lower pole cyst on the left measures 2.1 cm. Stomach distended with ingested contents. There are no dilated or thickened bowel loops. Distal small bowel loops are fluid-filled. Colonic tortuosity with small to moderate stool burden. No colonic wall thickening. Small retroperitoneal lymph nodes, not enlarged by size criteria. No pelvic adenopathy. Enlarged prostate gland measuring 6.6 cm transverse with mass effect on the bladder base. Bladder physiologically distended. There is fat in the right inguinal canal. There are no acute or suspicious osseous abnormalities. Review of the MIP images confirms the above findings. IMPRESSION: 1. Normal caliber thoracoabdominal aorta without dissection or acute aortic  abnormality. Moderate atherosclerosis. 2. Lobular right apical pulmonary nodule measures 2.2 x 1.5 x 1.3 cm with minimal spiculation, concerning for primary bronchogenic neoplasm. There is no adenopathy. Other than a subcentimeter lucency in T4 which is nonspecific, no evidence of metastatic disease on this arterial phase imaging exams. This bone lucency may simply represent normal variation. Recommend PET-CT characterization. 3. Faint reticulonodular opacity in the subpleural right lower lobe is likely infectious or inflammatory. 4. No acute abnormality in the abdomen/pelvis. Prostatomegaly is incidentally noted. Electronically Signed   By: Rubye Oaks M.D.   On: 01/01/2016 00:47   Ct Cta Abd/pel W/cm &/or W/o Cm  01/01/2016  CLINICAL DATA:  Sudden onset of central chest pain radiating to the back. Rule out dissection. EXAM: CT ANGIOGRAPHY CHEST, ABDOMEN AND PELVIS TECHNIQUE: Multidetector CT imaging  through the chest, abdomen and pelvis was performed using the standard protocol during bolus administration of intravenous contrast. Multiplanar reconstructed images and MIPs were obtained and reviewed to evaluate the vascular anatomy. CONTRAST:  OMNIPAQUE IOHEXOL 350 MG/ML SOLN COMPARISON:  None. FINDINGS: CTA CHEST FINDINGS Normal caliber thoracic aorta without dissection, aneurysm, hematoma or acute aortic syndrome. Mild atherosclerosis. Conventional branching pattern from the aortic arch. No filling defects in the pulmonary arteries to the segmental level. Heart is normal in size. Coronary artery calcification versus stent. Scattered normal size mediastinal nodes, no pathologic adenopathy. No hilar adenopathy. Lobular right apical nodule measures 1.5 x 1.3 x 2.2 cm with minimal surrounding spiculation. No additional suspicious pulmonary nodule. Faint reticular nodular subpleural opacity in the anterior right upper lobe likely infectious or inflammatory. No consolidation or pulmonary edema. Sub cm  lucency within T4 vertebral body, nonspecific. No sclerotic lesions. No acute osseous abnormality. Review of the MIP images confirms the above findings. CTA ABDOMEN AND PELVIS FINDINGS Normal caliber abdominal aorta without aneurysm, dissection, hematoma or acute aortic abnormality. Moderate diffuse atherosclerosis. Celiac, superior mesenteric, and inferior mesenteric arteries are patent. Single bilateral renal arteries are patent. Atherosclerosis of both iliac arteries without significant stenosis. Arterial phase imaging of the liver, spleen, and pancreas are normal. No adrenal nodule. Symmetric renal enhancement. Exophytic lower pole cyst on the left measures 2.1 cm. Stomach distended with ingested contents. There are no dilated or thickened bowel loops. Distal small bowel loops are fluid-filled. Colonic tortuosity with small to moderate stool burden. No colonic wall thickening. Small retroperitoneal lymph nodes, not enlarged by size criteria. No pelvic adenopathy. Enlarged prostate gland measuring 6.6 cm transverse with mass effect on the bladder base. Bladder physiologically distended. There is fat in the right inguinal canal. There are no acute or suspicious osseous abnormalities. Review of the MIP images confirms the above findings. IMPRESSION: 1. Normal caliber thoracoabdominal aorta without dissection or acute aortic abnormality. Moderate atherosclerosis. 2. Lobular right apical pulmonary nodule measures 2.2 x 1.5 x 1.3 cm with minimal spiculation, concerning for primary bronchogenic neoplasm. There is no adenopathy. Other than a subcentimeter lucency in T4 which is nonspecific, no evidence of metastatic disease on this arterial phase imaging exams. This bone lucency may simply represent normal variation. Recommend PET-CT characterization. 3. Faint reticulonodular opacity in the subpleural right lower lobe is likely infectious or inflammatory. 4. No acute abnormality in the abdomen/pelvis. Prostatomegaly is  incidentally noted. Electronically Signed   By: Rubye Oaks M.D.   On: 01/01/2016 00:47   US Abdomen Limited Ruq  01/06/2016  CLINICAL DATA:  Abnormal transaminase levels.  Fever.  Hypertension. EXAM: US ABDOMEN LIMITED - RIGHT UPPER QUADRANT COMPARISON:  12/31/2015 FINDINGS: Gallbladder: Difficult to assess due to difficulty with positioning. Dependent echogenicity in the gallbladder on image 6, probably a gallstone but with only limited if any shadowing. This was not well seen on CT. Gallbladder wall thickness within normal limits. Sonographic Murphy's sign absent. Common bile duct: Diameter: 3 mm Liver: Difficulty observing parts the left lobe due to bowel gas and gastric position. Visualized liver unremarkable. IMPRESSION: 1. Suspected 8 mm gallstone, although there was poor shadowing from this dependent hyperechogenicity, and mobility was difficult to assess. It is possible that this actually represents tumefactive sludge given the lack of definite shadowing. 2. No gallbladder wall thickening or biliary dilatation. 3. Liver appears unremarkable although visualization of parts of the left lobe was problematic due to the orientation of the left lobe with respect to the  stomach. Electronically Signed   By: Gaylyn Rong M.D.   On: 01/06/2016 16:43    Assessment/Plan  Chest pain during therapy which appears to be resolving-with his history certainly this is still concerning we did do an EKG that did not show what appears to be any acute process-vital signs are relatively stable except for somewhat soft systolic pressure-he is not complaining of shortness of breath or diaphoresis-nonetheless this is a concerning situation will send him to the ER for expedient evaluation-I do note he also has worsening renal insufficiency with a creatinine of 2.4-and continued significant hematuria-and weakness-I suspect there may be multiple etiologies at work here -- he is increasingly weak per nursing and would  not feel comfortable keeping him here -he is slated for discharge actually in the near future although at this point I suspect this may be unlikely.  Again will await ER evaluation he is not in any distress but certainly is a fragile individual here   [id number]

## 2016-01-28 NOTE — Progress Notes (Signed)
Attempted to call report, RN unavailable.

## 2016-01-28 NOTE — H&P (Signed)
[facility] History and Physical  Lee Chang Moulin ZOX:[id number] DOB: 1941/09/04 DOA: 01/28/2016  Referring physician: ED physician PCP: [name], MD  Specialists: Dr. Diona Browner (cardiology)   Chief Complaint:  Chest pain, confusion   HPI: Lee Chang is a 75 y.o. male with PMH of hypertension, COPD, dementia, and coronary artery disease with 2 drug-eluting stents placed to the RCA 1 month ago and now presenting from his SNF with worsening confusion and chest pain just prior to arrival. Patient had reportedly been quite independent until just over 1 month ago when he suffered a non-STEMI and underwent catheterization with 2 DESs placed to the RCA. His recovery was complicated by gross hematuria which was evaluated by urology and with CT angiography of the abdomen and pelvis. He was noted to have BPH and a renal cyst, but the imaging study was otherwise negative. Ultimately, the hematuria was attributed to BPH and presence of Foley catheter. Bleeding cleared and the patient was discharged to SNF and recommended for outpatient follow-up with urology which is not yet taken place. Unfortunately, patient also exhibited marked worsening in his dementia during that hospitalization, requiring SNF placement. Since his discharge, cognitive functioning has continued to decline, particularly over the last couple days. Over the same interval, gross hematuria has returned. Just prior to arrival, the patient was undergoing physical therapy at the SNF and reported chest pain with activity. He was sent to the ED for evaluation.  In ED, patient was found to be afebrile, saturating well on room air, with soft blood pressure, but vitals otherwise stable. Head CT is negative for acute intracranial abnormality and chest x-ray demonstrates no acute cardiopulmonary processes. EKG features a junctional rhythm with early R-wave transition and minimal, borderline ST elevation in the lateral leads. Initial troponin is  undetectable and the patient denies any chest pain upon arrival. Chemistry panels notable for a serum creatinine of 2.19, up from an apparent baseline of 1.1. CBC features a leukocytosis to 11,600 and hemoglobin of 7.1, down from 12.7 on 01/21/2016. DRE was performed and FOBT is negative. Dr. Annabell Howells of urology graciously discussed the case with me and will plan to see the patient tomorrow afternoon; recommendations for initial management have been given.  Patient will be admitted for ongoing evaluation and management of gross hematuria with acute blood loss anemia and chest pain.  Where does patient live?   SNF     Can patient participate in ADLs?  Yes      Review of Systems:  Unable to obtain reliable ROS secondary to the patient's clinical condition with confusional state.    Allergy:  Allergies  Allergen Reactions  . Asa [Aspirin]     Bleeding ulcer prior to 2007    Past Medical History  Diagnosis Date  . Ulcer   . Asthma   . Essential hypertension   . NSTEMI (non-ST elevated myocardial infarction) (HCC) 12/31/2015    Echo 01/01/16 (pre-CATH-PCI): EF 60-65%, no RWMA.   . 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 Moderate OM1 35% & midLAD 45%  . 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 Dyslipidemia, goal LDL below 70 01/02/2016  . COPD (chronic obstructive pulmonary disease) (HCC) 01/01/2016  .  Lung nodule 01/01/2016  . Dementia   . BPH (benign prostatic hypertrophy) with urinary obstruction   . Acute urinary retention     Past Surgical History  Procedure Laterality Date  . Hernia repair    . Cardiac catheterization N/A 01/01/2016    Procedure: Left Heart Cath and Coronary Angiography;  Surgeon: Marykay Lex,  MD;  Location: [facility] INVASIVE CV LAB;  Service: Cardiovascular;  Laterality: N/A;  . Cardiac catheterization Right 01/01/2016    Procedure: Coronary Stent Intervention;  Surgeon: Marykay Lex, MD;  Location[facility] INVASIVE CV LAB;  Service: Cardiovascular;  Laterality: Right;  . Tonsillectomy      Social History:  reports that he quit smoking about 11 years ago. He does not have any smokeless tobacco history on file. He reports that he does not drink alcohol. His drug history is not on file.  Family History:  Family History  Problem Relation Age of Onset  . Diabetes Brother   . Cancer Neg Hx   . Stroke Neg Hx   . Heart disease Neg Hx      Prior to Admission medications   Medication Sig Start Date End Date Taking? Authorizing Provider  acetaminophen (TYLENOL) 325 MG tablet Take 650 mg by mouth every 6 (six) hours as needed for moderate pain.   Yes Historical Provider, MD  albuterol (PROVENTIL) (2.5 MG/3ML) 0.083% nebulizer solution Take 3 mLs (2.5 mg total) by nebulization every 2 (two) hours as needed for wheezing. 01/11/16  Yes Belkys A Regalado, MD  alum & mag hydroxide-simeth (MAALOX/MYLANTA) 200-200-20 MG/5ML suspension Take 30 mLs by mouth every 6 (six) hours as needed for indigestion or heartburn. 01/11/16  Yes Belkys A Regalado, MD  aspirin EC 81 MG EC tablet Take 1 tablet (81 mg total) by mouth daily. 01/11/16  Yes Belkys A Regalado, MD  atorvastatin (LIPITOR) 80 MG tablet Take 1 tablet (80 mg total) by mouth daily at 6 PM. 01/11/16  Yes Belkys A Regalado, MD  carvedilol (COREG) 12.5 MG tablet Take 1 tablet (12.5 mg total) by mouth 2 (two) times daily with a meal. 01/11/16  Yes Belkys A Regalado, MD  finasteride (PROSCAR) 5 MG tablet Take 5 mg by mouth daily.  06/09/14  Yes Historical Provider, MD  Fish Oil-Cholecalciferol (FISH OIL + D3 PO) Take 1 capsule by mouth at bedtime.   Yes Historical Provider, MD  losartan (COZAAR) 100 MG tablet Take 1 tablet (100 mg total) by mouth daily. 01/11/16   Yes Belkys A Regalado, MD  nitroGLYCERIN (NITROSTAT) 0.4 MG SL tablet Place 1 tablet (0.4 mg total) under the tongue every 5 (five) minutes as needed for chest pain. 01/11/16  Yes Belkys A Regalado, MD  potassium chloride (K-DUR) 10 MEQ tablet Take 10 mEq by mouth daily.   Yes Historical Provider, MD  SPIRIVA HANDIHALER 18 MCG inhalation capsule Place 1 capsule into inhaler and inhale daily. 12/03/15  Yes Historical Provider, MD  tamsulosin (FLOMAX) 0.4 MG CAPS capsule Take 0.8 mg by mouth at bedtime.  06/09/14  Yes Historical Provider, MD  ticagrelor (BRILINTA) 90 MG TABS tablet Take 1 tablet (90 mg total) by mouth 2 (two) times daily. 01/11/16  Yes Belkys A Regalado, MD  VENTOLIN HFA 108 (90 BASE) MCG/ACT inhaler Inhale 2 puffs into the lungs every 6 (six) hours as needed for wheezing or shortness of breath.  06/30/14  Yes Historical Provider, MD    Physical Exam: Filed Vitals:   01/28/16 1630 01/28/16 1730 01/28/16 1800 01/28/16  1830  BP: 115/53 114/54 121/52 118/77  Pulse: 59  64   Temp:      TempSrc:      Resp: 18 20 21 17   Height:      Weight:      SpO2: 100%  98%    General: Not in acute distress. Tearful, emotional.  HEENT:       Eyes: PERRL, EOMI, no scleral icterus.  Conjunctival pallor.       ENT: No discharge from the ears or nose, no pharyngeal ulcers.        Neck: No JVD, no bruit, no appreciable mass Heme: No cervical adenopathy, no pallor Cardiac: S1/S2, RRR, soft systolic murmur at upper SB and apex, No gallops or rubs. Pulm: Good air movement bilaterally. No rales, wheezing, rhonchi or rubs. Abd: Soft, nondistended, mild tenderness in LLQ, no rebound pain or gaurding, BS present. Ext: No LE edema bilaterally. 2+DP/PT pulse bilaterally. Musculoskeletal: No gross deformity, no red, hot, swollen joints   Skin: No rashes or wounds on exposed surfaces  Neuro: Alert, oriented to person only, cranial nerves II-XII grossly intact. No focal findings Psych: Patient is not overtly  psychotic, pseudobulbar affect.  Labs on Admission:  Basic Metabolic Panel:  Recent Labs Lab 01/26/16 0707 01/28/16 0710 01/28/16 1535  NA 141 139 133*  K 4.4 4.7 4.5  CL 110 107 103  CO2 25 24 23   GLUCOSE 107* 116* 124*  BUN 17 28* 28*  CREATININE 1.39* 2.40* 2.19*  CALCIUM 8.7* 8.7* 8.5*   Liver Function Tests: No results for input(s): AST, ALT, ALKPHOS, BILITOT, PROT, ALBUMIN in the last 168 hours. No results for input(s): LIPASE, AMYLASE in the last 168 hours. No results for input(s): AMMONIA in the last 168 hours. CBC:  Recent Labs Lab 01/28/16 1535  WBC 11.6*  NEUTROABS 8.1*  HGB 7.1*  HCT 20.8*  MCV 88.1  PLT 300   Cardiac Enzymes:  Recent Labs Lab 01/28/16 1535  TROPONINI <0.03    BNP (last 3 results)  Recent Labs  01/01/16 1840  BNP 36.1    ProBNP (last 3 results) No results for input(s): PROBNP in the last 8760 hours.  CBG: No results for input(s): GLUCAP in the last 168 hours.  Radiological Exams on Admission: Ct Head Wo Contrast  01/28/2016  CLINICAL DATA:  Worsening dementia x 1 day.  Hx of HTN,CAD,COPD EXAM: CT HEAD WITHOUT CONTRAST TECHNIQUE: Contiguous axial images were obtained from the base of the skull through the vertex without intravenous contrast. COMPARISON:  01/10/2016 Switch FINDINGS: The ventricles are normal in size, for the patient's age, and normal in configuration. There are no parenchymal masses or mass effect. There is no evidence of a cortical infarct. Not mild periventricular white matter hypoattenuation is noted consistent chronic microvascular ischemic change. There are no extra-axial masses or abnormal fluid collections. There is no intracranial hemorrhage. The visualized sinuses and mastoid air cells are clear. No skull lesion. Stable appearance from the prior head CT. IMPRESSION: 1. No acute intracranial abnormalities. 2. Age-appropriate volume loss. Mild chronic microvascular ischemic change. Electronically Signed   By:  Amie Portlandavid  Ormond M.D.   On: 01/28/2016 16:37   Dg Chest Portable 1 View  01/28/2016  CLINICAL DATA:  75 year old male with increased chest pain, blood pressure running low. Initial encounter. EXAM: PORTABLE CHEST 1 VIEW COMPARISON:  01/05/2016 and earlier. FINDINGS: Portable AP upright view at 1542 hours. Stable lung volumes. Stable mild cardiomegaly. Allowing for portable technique, the lungs are  clear. No pneumothorax. IMPRESSION: No acute cardiopulmonary abnormality. Electronically Signed   By: Odessa Fleming M.D.   On: 01/28/2016 15:57    EKG: Independently reviewed.  Abnormal findings:  Junctional rhythm, early R-wave transition, minimal ST-elevation in lateral leads   Assessment/Plan  1. Gross hematuria with acute blood loss anemia  - Hgb 7.1 on admission; was 12.7 on 01/21/16  - Presents with Foley, bag filled with cola-colored fluid, seems to be draining well  - Developed gross hematuria when catheter placed at time of cardiac stenting last month; had urology w/u and felt most likely d/t BPH and catheter; urine cleared spontaneously  - Will hold Brilinta and ASA overnight while transfusing 2 units pRBCs; RN to place order for post-transfusion H/H  - Appreciate Dr. Annabell Howells of urology, discussed the case, will plan to see pt afternoon of 4/8  - Holding pharmacologic VTE ppx, SCD only now  - Bladder irrigation with NS prn obstruction  - Continue finasteride, tamsulosin   2. Chest pain, CAD  - Pt has hx of non-STEMI and placement of 2 DES to RCA last mo; non-obstructive LAD and OM disease also noted at that time  - He developed angina with PT at SNF just pta  - Initial trop <0.03, second trop also <0.03; pain resolved pta; EKG with junctional rhythm and borderline ST-elevation in lateral leads  - Held ASA given active bleed with anemia  - Suspect his significant anemia is driving the angina - Keep O2 sats ~100 while significantly anemic  - Transfuse 2 units pRBCs now  - Obtain serial troponin  measurements and EKG overnight   3. COPD - No suggestion of exacerbation, appears stable  - Continue Spiriva scheduled, prn neb treatments  - Supplemental O2 titrated to keep sats high 90s for now given CAD and significant anemia    4. Hypertension - BP running low currently in setting of active blood loss and anemia  - Hold his home antihypertensives (losartan, Coreg), resume as needed once stable    DVT ppx:  SCDs  Code Status: DNR Family Communication:  Yes, patient's wife and daughter at bed side Disposition Plan: Admit to inpatient   Date of Service 01/28/2016    Briscoe Deutscher, MD [facility] Pager [phone]  If 7PM-7AM, please contact night-coverage [email or web address] Password [facility] 01/28/2016, 6:57 PM

## 2016-01-29 ENCOUNTER — Other Ambulatory Visit: Payer: Self-pay

## 2016-01-29 ENCOUNTER — Encounter (HOSPITAL_COMMUNITY): Payer: Self-pay | Admitting: Urology

## 2016-01-29 DIAGNOSIS — D500 Iron deficiency anemia secondary to blood loss (chronic): Secondary | ICD-10-CM | POA: Diagnosis not present

## 2016-01-29 DIAGNOSIS — N179 Acute kidney failure, unspecified: Secondary | ICD-10-CM | POA: Diagnosis not present

## 2016-01-29 DIAGNOSIS — D62 Acute posthemorrhagic anemia: Secondary | ICD-10-CM | POA: Diagnosis not present

## 2016-01-29 DIAGNOSIS — I2510 Atherosclerotic heart disease of native coronary artery without angina pectoris: Secondary | ICD-10-CM | POA: Diagnosis not present

## 2016-01-29 DIAGNOSIS — R310 Gross hematuria: Secondary | ICD-10-CM | POA: Diagnosis not present

## 2016-01-29 DIAGNOSIS — J438 Other emphysema: Secondary | ICD-10-CM | POA: Diagnosis not present

## 2016-01-29 DIAGNOSIS — R079 Chest pain, unspecified: Secondary | ICD-10-CM | POA: Diagnosis not present

## 2016-01-29 DIAGNOSIS — N401 Enlarged prostate with lower urinary tract symptoms: Secondary | ICD-10-CM | POA: Diagnosis not present

## 2016-01-29 DIAGNOSIS — N170 Acute kidney failure with tubular necrosis: Secondary | ICD-10-CM | POA: Diagnosis not present

## 2016-01-29 DIAGNOSIS — N3289 Other specified disorders of bladder: Secondary | ICD-10-CM | POA: Diagnosis not present

## 2016-01-29 DIAGNOSIS — I10 Essential (primary) hypertension: Secondary | ICD-10-CM | POA: Diagnosis not present

## 2016-01-29 DIAGNOSIS — Z7902 Long term (current) use of antithrombotics/antiplatelets: Secondary | ICD-10-CM | POA: Diagnosis not present

## 2016-01-29 LAB — CBC WITH DIFFERENTIAL/PLATELET
BASOS ABS: 0 10*3/uL (ref 0.0–0.1)
BASOS PCT: 0 %
BASOS PCT: 0 %
Basophils Absolute: 0 10*3/uL (ref 0.0–0.1)
EOS ABS: 0.7 10*3/uL (ref 0.0–0.7)
EOS ABS: 0.8 10*3/uL — AB (ref 0.0–0.7)
EOS PCT: 9 %
Eosinophils Relative: 8 %
HCT: 25.3 % — ABNORMAL LOW (ref 39.0–52.0)
HEMATOCRIT: 24.3 % — AB (ref 39.0–52.0)
HEMOGLOBIN: 8.3 g/dL — AB (ref 13.0–17.0)
Hemoglobin: 8.7 g/dL — ABNORMAL LOW (ref 13.0–17.0)
LYMPHS ABS: 1.4 10*3/uL (ref 0.7–4.0)
Lymphocytes Relative: 19 %
Lymphocytes Relative: 16 %
Lymphs Abs: 1.7 10*3/uL (ref 0.7–4.0)
MCH: 29.6 pg (ref 26.0–34.0)
MCH: 30.1 pg (ref 26.0–34.0)
MCHC: 34.2 g/dL (ref 30.0–36.0)
MCHC: 34.4 g/dL (ref 30.0–36.0)
MCV: 86.8 fL (ref 78.0–100.0)
MCV: 87.5 fL (ref 78.0–100.0)
Monocytes Absolute: 0.8 10*3/uL (ref 0.1–1.0)
Monocytes Absolute: 1 10*3/uL (ref 0.1–1.0)
Monocytes Relative: 8 %
Monocytes Relative: 11 %
NEUTROS ABS: 5.9 10*3/uL (ref 1.7–7.7)
NEUTROS PCT: 65 %
NEUTROS PCT: 64 %
Neutro Abs: 5.7 10*3/uL (ref 1.7–7.7)
PLATELETS: 200 10*3/uL (ref 150–400)
Platelets: 204 10*3/uL (ref 150–400)
RBC: 2.8 MIL/uL — AB (ref 4.22–5.81)
RBC: 2.89 MIL/uL — AB (ref 4.22–5.81)
RDW: 14.1 % (ref 11.5–15.5)
RDW: 14.4 % (ref 11.5–15.5)
WBC: 9.1 10*3/uL (ref 4.0–10.5)
WBC: 8.9 10*3/uL (ref 4.0–10.5)

## 2016-01-29 LAB — BASIC METABOLIC PANEL
ANION GAP: 6 (ref 5–15)
BUN: 22 mg/dL — ABNORMAL HIGH (ref 6–20)
CALCIUM: 7.8 mg/dL — AB (ref 8.9–10.3)
CO2: 20 mmol/L — AB (ref 22–32)
CREATININE: 1.74 mg/dL — AB (ref 0.61–1.24)
Chloride: 110 mmol/L (ref 101–111)
GFR calc non Af Amer: 37 mL/min — ABNORMAL LOW (ref >60)
GFR, EST AFRICAN AMERICAN: 42 mL/min — AB (ref >60)
Glucose, Bld: 93 mg/dL (ref 65–99)
Potassium: 4.1 mmol/L (ref 3.5–5.1)
SODIUM: 136 mmol/L (ref 135–145)

## 2016-01-29 LAB — GLUCOSE, CAPILLARY: GLUCOSE-CAPILLARY: 83 mg/dL (ref 65–99)

## 2016-01-29 LAB — TROPONIN I

## 2016-01-29 MED ORDER — SODIUM CHLORIDE 0.45 % IV SOLN
INTRAVENOUS | Status: DC
  Administered 2016-01-29 – 2016-01-30 (×3): via INTRAVENOUS

## 2016-01-29 MED ORDER — DEXTROSE 5 % IV SOLN
1.0000 g | INTRAVENOUS | Status: DC
  Administered 2016-01-29 – 2016-02-02 (×5): 1 g via INTRAVENOUS
  Filled 2016-01-29 (×6): qty 10

## 2016-01-29 NOTE — Progress Notes (Addendum)
Initial Nutrition Assessment  DOCUMENTATION CODES:  Severe malnutrition in context of acute illness/injury   INTERVENTION:  Ensure Enlive po BID, each supplement provides 350 kcal and 20 grams of protein  NUTRITION DIAGNOSIS:  Malnutrition related to poor appetite, acute illness as evidenced by loss of >5% bw in 1 month and an oral intake that met < or equal to 50% of estimated needs for > or equal to 5 days  GOAL:  Patient will meet greater than or equal to 90% of their needs  MONITOR:  PO intake, Supplement acceptance, Labs, Weight trends  REASON FOR ASSESSMENT:  Malnutrition Screening Tool    ASSESSMENT:  75 y/o male PMHx htn, copd, dementia, Cad who presents from SNF w/ worsening confusion, chest pain with activity and hematuria. Admitted for evaluation of hematuria and acute blood loss anemia.   RD operating remotely. Pt presents from the [facility]. Per their documentation, pt has been eating <50% of his meals for the past 4 days. He is documented as having erratic intake, eating anywhere from 25-100% of meals. He was on a  No Added Salt Diet.   At the [facility], he took a Fish oil w/ Vit D and a KCL supplement.   He was admitted to SNF at weight of 185 lbs. He was admitted to hospital at 180 lbs. Just one moth ago, when he suffered his Nstemi, he weighed 192 lbs. This is a clinically significant wt loss  NFPE: Unable to conduct  Labs reviewed: H/H:8.3/24.3, RBC: 2.80   Recent Labs Lab 01/28/16 0710 01/28/16 1535 01/29/16 0658  NA 139 133* 136  K 4.7 4.5 4.1  CL 107 103 110  CO2 24 23 20*  BUN 28* 28* 22*  CREATININE 2.40* 2.19* 1.74*  CALCIUM 8.7* 8.5* 7.8*  GLUCOSE 116* 124* 93    Diet Order:  Diet Heart Room service appropriate?: Yes; Fluid consistency:: Thin  Skin: Dry, ecchymotic   Last BM:  4/6  Height:  Ht Readings from Last 1 Encounters:  01/28/16 '5\' 6"'$  (1.676 m)   Weight:  Wt Readings from Last 1 Encounters:  01/29/16 180 lb 5.4 oz (81.8  kg)   Wt Readings from Last 10 Encounters:  01/29/16 180 lb 5.4 oz (81.8 kg)  01/28/16 185 lb 3.2 oz (84.006 kg)  01/18/16 185 lb 3.2 oz (84.006 kg)  01/13/16 185 lb 3.2 oz (84.006 kg)  01/11/16 194 lb 1.6 oz (88.043 kg)  06/30/14 191 lb 8 oz (86.864 kg)   Ideal Body Weight:  64.54 kg  BMI:  Body mass index is 29.12 kg/(m^2).  Estimated Nutritional Needs:  Kcal:  1700-1900 (21-23 kcal/kg bw) Protein:  65-77 g (1-1.2 g/kg bw) Fluid:  1.7-1.9 liters  EDUCATION NEEDS:  No education needs identified at this time  Burtis Junes RD, LDN Clinical Nutrition Pager: [phone] 01/29/2016 8:34 AM

## 2016-01-29 NOTE — Consult Note (Signed)
Subjective: CC: Hematuria.  Hx:  Lee Chang is a 75 yo WM who sees Dr. Vernie Ammons in our office.   He has BPH with a large prostate and was seen in consultation in March in the hospital for hematuria following stent placement for CAD and initiation of Brillinta and ASA.  He was recently seen by Dr. Vernie Ammons for a voiding trial which he failed and a foley was replaced.  He was readmitted yesterday with recurrent gross hematuria with acute blood loss anemia from a Hgb of 14.5 on 3.22 down to 7.1 yesterday.  I was asked to see him in consultation by Dr. Kerry Hough.   He his foley has been draining but has required irrigation.   The urine remains bloody but the Hgb is up with transfusion.   The Brillinta and ASA have been held since yesterday.   He is demented but doesn't report pain at this time.   He has a long history of BPH with an elevated PSA and negative biopsies in the past.  He is on finasteride and tamsulosin for the BPH.    ROS:  Review of Systems  Unable to perform ROS: dementia    Allergies  Allergen Reactions  . Asa [Aspirin]     Bleeding ulcer prior to 2007    Past Medical History  Diagnosis Date  . Ulcer   . Asthma   . Essential hypertension   . NSTEMI (non-ST elevated myocardial infarction) (HCC) 12/31/2015    Echo 01/01/16 (pre-CATH-PCI): EF 60-65%, no RWMA.   . 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 Moderate OM1 35% & midLAD 45%  . 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 Dyslipidemia, goal LDL below 70 01/02/2016  . COPD (chronic obstructive pulmonary disease) (HCC) 01/01/2016  . Lung nodule 01/01/2016  . Dementia   . BPH (benign prostatic hypertrophy) with urinary obstruction   . Acute urinary retention    . Elevated PSA     Past Surgical History  Procedure Laterality Date  . Hernia repair    . Cardiac catheterization N/A 01/01/2016    Procedure: Left Heart Cath and Coronary Angiography;  Surgeon: Marykay Lex, MD;  Location: [facility] INVASIVE CV LAB;  Service: Cardiovascular;  Laterality: N/A;  . Cardiac catheterization Right 01/01/2016    Procedure: Coronary Stent Intervention;  Surgeon: Marykay Lex, MD;  Location: [facility] INVASIVE CV LAB;  Service: Cardiovascular;  Laterality: Right;  . Tonsillectomy    . Prostate biopsy      Social History   Social History  . Marital Status: Single    Spouse Name: N/A  . Number of Children: N/A  . Years of Education: N/A   Occupational History  . Not on file.   Social History Main Topics  . Smoking status: Former Smoker    Quit date: 10/23/2004  . Smokeless tobacco: Not on file     Comment: Less than 1 pack per day 20 years  . Alcohol Use: No  . Drug Use: Not on file  . Sexual Activity: Not on file   Other Topics Concern  . Not on file   Social History Narrative    Family History  Problem Relation Age of  Onset  . Diabetes Brother   . Cancer Neg Hx   . Stroke Neg Hx   . Heart disease Neg Hx     Anti-infectives: Anti-infectives    Start     Dose/Rate Route Frequency Ordered Stop   01/29/16 1200  cefTRIAXone (ROCEPHIN) 1 g in dextrose 5 % 50 mL IVPB     1 g 100 mL/hr over 30 Minutes Intravenous Every 24 hours 01/29/16 1048        Current Facility-Administered Medications  Medication Dose Route Frequency Provider Last Rate Last Dose  . 0.45 % sodium chloride infusion   Intravenous Continuous Erick Blinks, MD 100 mL/hr at 01/29/16 1300    . acetaminophen (TYLENOL) tablet 650 mg  650 mg Oral Q6H PRN Lavone Neri Opyd, MD      . albuterol (PROVENTIL) (2.5 MG/3ML) 0.083% nebulizer solution 2.5 mg  2.5 mg Nebulization Q2H PRN Briscoe Deutscher, MD      . alum & mag hydroxide-simeth (MAALOX/MYLANTA) 200-200-20 MG/5ML suspension 30 mL   30 mL Oral Q6H PRN Briscoe Deutscher, MD      . atorvastatin (LIPITOR) tablet 80 mg  80 mg Oral q1800 Briscoe Deutscher, MD   80 mg at 01/28/16 2307  . bisacodyl (DULCOLAX) EC tablet 5 mg  5 mg Oral Daily PRN Briscoe Deutscher, MD      . cefTRIAXone (ROCEPHIN) 1 g in dextrose 5 % 50 mL IVPB  1 g Intravenous Q24H Erick Blinks, MD   1 g at 01/29/16 1402  . feeding supplement (ENSURE ENLIVE) (ENSURE ENLIVE) liquid 237 mL  237 mL Oral BID BM Meredeth Ide, MD   237 mL at 01/29/16 1000  . finasteride (PROSCAR) tablet 5 mg  5 mg Oral Daily Briscoe Deutscher, MD   5 mg at 01/29/16 0049  . HYDROcodone-acetaminophen (NORCO/VICODIN) 5-325 MG per tablet 1-2 tablet  1-2 tablet Oral Q4H PRN Briscoe Deutscher, MD      . nitroGLYCERIN (NITROSTAT) SL tablet 0.4 mg  0.4 mg Sublingual Q5 min PRN Briscoe Deutscher, MD      . ondansetron (ZOFRAN) tablet 4 mg  4 mg Oral Q6H PRN Briscoe Deutscher, MD       Or  . ondansetron (ZOFRAN) injection 4 mg  4 mg Intravenous Q6H PRN Lavone Neri Opyd, MD      . polyethylene glycol (MIRALAX / GLYCOLAX) packet 17 g  17 g Oral Daily PRN Lavone Neri Opyd, MD      . sodium chloride flush (NS) 0.9 % injection 3 mL  3 mL Intravenous Q12H Briscoe Deutscher, MD   3 mL at 01/28/16 2313  . sodium chloride irrigation 0.9 % 3,000 mL  3,000 mL Bladder Irrigation PRN Briscoe Deutscher, MD      . tamsulosin (FLOMAX) capsule 0.8 mg  0.8 mg Oral QHS Lavone Neri Opyd, MD   0.8 mg at 01/28/16 2307  . tiotropium (SPIRIVA) inhalation capsule 18 mcg  1 capsule Inhalation Daily Briscoe Deutscher, MD   18 mcg at 01/28/16 1944   Past, family and social history reviewed and updated.   Objective: Vital signs in last 24 hours: Temp:  [97.4 F (36.3 C)-99.7 F (37.6 C)] 99.7 F (37.6 C) (04/08 1145) Pulse Rate:  [59-65] 64 (04/08 0053) Resp:  [7-23] 19 (04/08 1400) BP: (90-195)/(39-173) 148/67 mmHg (04/08 1400) SpO2:  [94 %-100 %] 98 % (04/08 1400) Weight:  [81.8 kg (180 lb 5.4 oz)-86.183 kg (190 lb)]  81.8 kg (180 lb 5.4 oz) (04/08  0400)  Intake/Output from previous day: 04/07 0701 - 04/08 0700 In: 813 [I.V.:143; Blood:670] Out: 550 [Urine:550] Intake/Output this shift: Total I/O In: 470 [I.V.:420; IV Piggyback:50] Out: 650 [Urine:650]   Physical Exam  Constitutional: He is well-developed, well-nourished, and in no distress.  Cardiovascular: Normal rate and regular rhythm.   Pulmonary/Chest: Effort normal. No respiratory distress.  Abdominal: Soft. He exhibits no mass. There is no tenderness.  Genitourinary:  Uncircumcised phallus with a 2616fr foley at the meatus draining dark old looking bloody urine..  Musculoskeletal: Normal range of motion. He exhibits no edema or tenderness.  Neurological:  He is demented but responsive.   Skin: Skin is warm and dry.  Vitals reviewed.   Lab Results:   Recent Labs  01/28/16 1535 01/29/16 0658  WBC 11.6* 9.1  HGB 7.1* 8.3*  HCT 20.8* 24.3*  PLT 300 204   BMET  Recent Labs  01/28/16 1535 01/29/16 0658  NA 133* 136  K 4.5 4.1  CL 103 110  CO2 23 20*  GLUCOSE 124* 93  BUN 28* 22*  CREATININE 2.19* 1.74*  CALCIUM 8.5* 7.8*   PT/INR  Recent Labs  01/28/16 1930  LABPROT 14.6  INR 1.12   ABG No results for input(s): PHART, HCO3 in the last 72 hours.  Invalid input(s): PCO2, PO2  Studies/Results: Ct Head Wo Contrast  01/28/2016  CLINICAL DATA:  Worsening dementia x 1 day.  Hx of HTN,CAD,COPD EXAM: CT HEAD WITHOUT CONTRAST TECHNIQUE: Contiguous axial images were obtained from the base of the skull through the vertex without intravenous contrast. COMPARISON:  01/10/2016 Switch FINDINGS: The ventricles are normal in size, for the patient's age, and normal in configuration. There are no parenchymal masses or mass effect. There is no evidence of a cortical infarct. Not mild periventricular white matter hypoattenuation is noted consistent chronic microvascular ischemic change. There are no extra-axial masses or abnormal fluid collections. There is no  intracranial hemorrhage. The visualized sinuses and mastoid air cells are clear. No skull lesion. Stable appearance from the prior head CT. IMPRESSION: 1. No acute intracranial abnormalities. 2. Age-appropriate volume loss. Mild chronic microvascular ischemic change. Electronically Signed   By: Amie Portlandavid  Ormond M.D.   On: 01/28/2016 16:37   Dg Chest Portable 1 View  01/28/2016  CLINICAL DATA:  75 year old male with increased chest pain, blood pressure running low. Initial encounter. EXAM: PORTABLE CHEST 1 VIEW COMPARISON:  01/05/2016 and earlier. FINDINGS: Portable AP upright view at 1542 hours. Stable lung volumes. Stable mild cardiomegaly. Allowing for portable technique, the lungs are clear. No pneumothorax. IMPRESSION: No acute cardiopulmonary abnormality. Electronically Signed   By: Odessa FlemingH  Hall M.D.   On: 01/28/2016 15:57   Procedure:  I hand irrigated the foley with a Toomey syringe and NS with the return of 50-16100ml of old clot.  I was able to get the irrigant to a light pink return.   Assessment: Gross hematuria from prostatic bleeding secondary to antiplatelet therapy.    Continue to hold antiplatelet therapy until the urine completely clears and at that point it might be worthwhile to consider something other than Brillinta to see if he will do better with that.   If he continues to have bleeding issues, he might require a trip to the OR for fulguration.   Continue prn irrigation with NS.     CC: CC: Dr. Veneda MelterJ. Memon and Dr. Ihor GullyMark Ottelin.      Siddarth Hsiung J 01/29/2016 [phone]

## 2016-01-29 NOTE — Progress Notes (Signed)
Patient ID: Lee Chang, male   DOB: 1941/04/22, 75 y.o.   MRN: [id number]

## 2016-01-29 NOTE — Progress Notes (Signed)
[facility] PROGRESS NOTE  Lee Chang ZOX:[id number] DOB: 1941-02-11 DOA: 01/28/2016 PCP: [name], MD  Assessment/Plan: 1. Gross hematuria. Developed hematuria when catheter was placed during cardiac stenting last month which was felt to be related to BPH and trauma from catheter. His urine had cleared prior to discharge., but unfortunately, hematuria recurred. At this time, he continues to have evidence of gross hematuia in foley tubing. Case was discussed with Dr. Wilson Singer, who plans on seeing Pt today. Continue flomax and finesteride. Presented with Foley Cath that was draining well. Hgb 7.1 on admission, previously 12.7 on 3/31. Was transfused 2U pRBC on 4/7, now 8.3. There is still a large amount of blood in urine. ASA and brilinta being held. Urology following. Urine cx in process. Holding pharmacologic VTE ppx. Will perform bladder irrigation with NS PRN obstruction. Contiue finasteride, tamsulosin.  2. Acute blood loss anemia. Pt was transfused 2U pRBC on 4/7. Hgb is now 8.3. May need further transfusion. Will continue to monitor. Will transfuse when hgb is less than 8.  3. AKI. Likely prerenal, related to volume depletion. Creat is improving with fluids. Continue to monitor.  BUN 22 and Cr 1.74. Will give fluids. 4. Possible UTI. UA indicates possible infection. Will start on Rocephin. If cx shows no growth, will d/c abx.     Will start on abx. 5. Lethargy. Possibly related to dehydration vs possible infection. Continue to monitor.  6. Chest pain, CAD. Likely due to anemia, has been transfused. ASA / brilinta currently held due to bleed. Pt has a hx of NSTEMI and placement of 2DES to RCA last month. Troponin has remained unremarkable. Ideally, the pt will need to go back on anti-platelet agents, once his hematuria issues have stabilized.  7. COPD. Stable. Continue nebs PRN. Continue O2 given CAD and anemia 8. HTN. Currently running low in setting of active blood loss anemia.  Antihypertensives being held, will resume as needed once stable  Code Status: DNR DVT prophylaxis: SCDs Family Communication: discussed with patient and daughter at bedside Disposition Plan: discharge once improved   Consultants:  Urology  Procedures:  2U pRBC transfusion 4/7  Antibiotics:  Rocephin 4/8 >>  HPI/Subjective: Does not feel well. Stomach is not bothering him. Breathing is okay. Per daughter, his current drowsy state onset once he was brought into the hospital.   Objective: Filed Vitals:   01/29/16 0345 01/29/16 0400  BP:  99/50  Pulse:    Temp: 98.2 F (36.8 C) 98.8 F (37.1 C)  Resp: 18 17    Intake/Output Summary (Last 24 hours) at 01/29/16 0540 Last data filed at 01/29/16 0500  Gross per 24 hour  Intake    708 ml  Output    550 ml  Net    158 ml   Filed Weights   01/28/16 1525 01/28/16 2000 01/29/16 0400  Weight: 86.183 kg (190 lb) 81.8 kg (180 lb 5.4 oz) 81.8 kg (180 lb 5.4 oz)    Exam:   General: NAD, looks comfortable, somnlent  Cardiovascular: RRR, S1, S2   Respiratory: clear bilaterally, No wheezing, rales or rhonchi   Abdomen: soft, non tender, no distention , bowel sounds normal. Foley cath in place  Musculoskeletal: No edema b/l  Data Reviewed: Basic Metabolic Panel:  Recent Labs Lab 01/26/16 0707 01/28/16 0710 01/28/16 1535  NA 141 139 133*  K 4.4 4.7 4.5  CL 110 107 103  CO2 GLUCOSE 107* 116* 124*  BUN 17 28* 28*  CREATININE 1.39* 2.40* 2.19*  CALCIUM 8.7* 8.7* 8.5*   Liver Function Tests: No results for input(s): AST, ALT, ALKPHOS, BILITOT, PROT, ALBUMIN in the last 168 hours. No results for input(s): LIPASE, AMYLASE in the last 168 hours. No results for input(s): AMMONIA in the last 168 hours. CBC:  Recent Labs Lab 01/28/16 1535  WBC 11.6*  NEUTROABS 8.1*  HGB 7.1*  HCT 20.8*  MCV 88.1  PLT 300   Cardiac Enzymes:  Recent Labs Lab 01/28/16 1535 01/28/16 1930  TROPONINI <0.03 <0.03    BNP (last 3 results)  Recent Labs  01/01/16 1840  BNP 36.1    ProBNP (last 3 results) No results for input(s): PROBNP in the last 8760 hours.  CBG: No results for input(s): GLUCAP in the last 168 hours.  Recent Results (from the past 240 hour(s))  Culture, Urine     Status: None   Collection Time: 01/19/16  2:00 PM  Result Value Ref Range Status   Specimen Description URINE, CLEAN CATCH  Final   Special Requests NONE  Final   Culture   Final    NO GROWTH 1 DAY Performed at [facility]    Report Status 01/20/2016 FINAL  Final  MRSA PCR Screening     Status: None   Collection Time: 01/28/16  9:08 PM  Result Value Ref Range Status   MRSA by PCR NEGATIVE NEGATIVE Final    Comment:        The GeneXpert MRSA Assay (FDA approved for NASAL specimens only), is one component of a comprehensive MRSA colonization surveillance program. It is not intended to diagnose MRSA infection nor to guide or monitor treatment for MRSA infections.      Studies: Ct Head Wo Contrast  01/28/2016  CLINICAL DATA:  Worsening dementia x 1 day.  Hx of HTN,CAD,COPD EXAM: CT HEAD WITHOUT CONTRAST TECHNIQUE: Contiguous axial images were obtained from the base of the skull through the vertex without intravenous contrast. COMPARISON:  01/10/2016 Switch FINDINGS: The ventricles are normal in size, for the patient's age, and normal in configuration. There are no parenchymal masses or mass effect. There is no evidence of a cortical infarct. Not mild periventricular white matter hypoattenuation is noted consistent chronic microvascular ischemic change. There are no extra-axial masses or abnormal fluid collections. There is no intracranial hemorrhage. The visualized sinuses and mastoid air cells are clear. No skull lesion. Stable appearance from the prior head CT. IMPRESSION: 1. No acute intracranial abnormalities. 2. Age-appropriate volume loss. Mild chronic microvascular ischemic change. Electronically  Signed   By: Amie Portland M.D.   On: 01/28/2016 16:37   Dg Chest Portable 1 View  01/28/2016  CLINICAL DATA:  75 year old male with increased chest pain, blood pressure running low. Initial encounter. EXAM: PORTABLE CHEST 1 VIEW COMPARISON:  01/05/2016 and earlier. FINDINGS: Portable AP upright view at 1542 hours. Stable lung volumes. Stable mild cardiomegaly. Allowing for portable technique, the lungs are clear. No pneumothorax. IMPRESSION: No acute cardiopulmonary abnormality. Electronically Signed   By: Odessa Fleming M.D.   On: 01/28/2016 15:57    Scheduled Meds: . atorvastatin  80 mg Oral q1800  . feeding supplement (ENSURE ENLIVE)  237 mL Oral BID BM  . finasteride  5 mg Oral Daily  . sodium chloride flush  3 mL Intravenous Q12H  . tamsulosin  0.8 mg Oral QHS  . tiotropium  1 capsule Inhalation Daily   Continuous Infusions: . sodium chloride 100 mL/hr at 01/29/16 0357  Principal Problem:   Acute blood loss anemia Active Problems:   COPD (chronic obstructive pulmonary disease) (HCC)   Essential hypertension   AKI (acute kidney injury) (HCC)   Dementia   Chest pain   Gross hematuria   CAD (coronary artery disease)   Pain in the chest    Time spent: 25 minutes    Erick BlinksJehanzeb Randeep Biondolillo, MD. [facility] Pager [phone]. If 7PM-7AM, please contact night-coverage at [email or web address], password [email or web address] 01/29/2016, 5:40 AM  LOS: 1 day     By signing my name below, I, Adron BeneGreylon Gawaluck, attest that this documentation has been prepared under the direction and in the presence of Erick BlinksJehanzeb Sosha Shepherd, MD. Electronically Signed: Adron BeneGreylon Gawaluck 01/29/2016 10:30am  I, Dr. Erick BlinksJehanzeb Charlie Seda, personally performed the services described in this documentaiton. All medical record entries made by the scribe were at my direction and in my presence. I have reviewed the chart and agree that the record reflects my personal performance and is accurate and complete  Erick BlinksJehanzeb Ian Cavey, MD, 01/29/2016 10:50 AM

## 2016-01-29 NOTE — Plan of Care (Signed)
Problem: Nutrition: Goal: Adequate nutrition will be maintained Outcome: Not Progressing Appetite remains poor; but pt did eat hotdog and friies from [employer]

## 2016-01-30 DIAGNOSIS — R310 Gross hematuria: Secondary | ICD-10-CM | POA: Diagnosis not present

## 2016-01-30 DIAGNOSIS — E43 Unspecified severe protein-calorie malnutrition: Secondary | ICD-10-CM | POA: Insufficient documentation

## 2016-01-30 DIAGNOSIS — R079 Chest pain, unspecified: Secondary | ICD-10-CM | POA: Diagnosis not present

## 2016-01-30 DIAGNOSIS — Z7902 Long term (current) use of antithrombotics/antiplatelets: Secondary | ICD-10-CM | POA: Diagnosis not present

## 2016-01-30 DIAGNOSIS — I10 Essential (primary) hypertension: Secondary | ICD-10-CM | POA: Diagnosis not present

## 2016-01-30 DIAGNOSIS — J438 Other emphysema: Secondary | ICD-10-CM | POA: Diagnosis not present

## 2016-01-30 DIAGNOSIS — N170 Acute kidney failure with tubular necrosis: Secondary | ICD-10-CM | POA: Diagnosis not present

## 2016-01-30 DIAGNOSIS — N179 Acute kidney failure, unspecified: Secondary | ICD-10-CM | POA: Diagnosis not present

## 2016-01-30 DIAGNOSIS — I2510 Atherosclerotic heart disease of native coronary artery without angina pectoris: Secondary | ICD-10-CM | POA: Diagnosis not present

## 2016-01-30 DIAGNOSIS — D62 Acute posthemorrhagic anemia: Secondary | ICD-10-CM | POA: Diagnosis not present

## 2016-01-30 LAB — BASIC METABOLIC PANEL
Anion gap: 6 (ref 5–15)
BUN: 17 mg/dL (ref 6–20)
CALCIUM: 7.9 mg/dL — AB (ref 8.9–10.3)
CO2: 21 mmol/L — ABNORMAL LOW (ref 22–32)
CREATININE: 1.37 mg/dL — AB (ref 0.61–1.24)
Chloride: 111 mmol/L (ref 101–111)
GFR calc Af Amer: 57 mL/min — ABNORMAL LOW (ref >60)
GFR, EST NON AFRICAN AMERICAN: 49 mL/min — AB (ref >60)
GLUCOSE: 87 mg/dL (ref 65–99)
POTASSIUM: 3.7 mmol/L (ref 3.5–5.1)
SODIUM: 138 mmol/L (ref 135–145)

## 2016-01-30 LAB — GLUCOSE, CAPILLARY: GLUCOSE-CAPILLARY: 87 mg/dL (ref 65–99)

## 2016-01-30 LAB — CBC
HCT: 24.5 % — ABNORMAL LOW (ref 39.0–52.0)
HEMATOCRIT: 26.5 % — AB (ref 39.0–52.0)
Hemoglobin: 8 g/dL — ABNORMAL LOW (ref 13.0–17.0)
Hemoglobin: 8.8 g/dL — ABNORMAL LOW (ref 13.0–17.0)
MCH: 29.3 pg (ref 26.0–34.0)
MCH: 29.8 pg (ref 26.0–34.0)
MCHC: 32.7 g/dL (ref 30.0–36.0)
MCHC: 33.2 g/dL (ref 30.0–36.0)
MCV: 89.7 fL (ref 78.0–100.0)
MCV: 89.8 fL (ref 78.0–100.0)
PLATELETS: 215 10*3/uL (ref 150–400)
PLATELETS: 218 10*3/uL (ref 150–400)
RBC: 2.73 MIL/uL — AB (ref 4.22–5.81)
RBC: 2.95 MIL/uL — ABNORMAL LOW (ref 4.22–5.81)
RDW: 14.1 % (ref 11.5–15.5)
RDW: 14.3 % (ref 11.5–15.5)
WBC: 12 10*3/uL — AB (ref 4.0–10.5)
WBC: 10.9 10*3/uL — ABNORMAL HIGH (ref 4.0–10.5)

## 2016-01-30 LAB — PROCALCITONIN

## 2016-01-30 NOTE — Progress Notes (Signed)
[facility] PROGRESS NOTE  Lee Chang ZOX:[id number] DOB: [birth year] DOA: 01/28/2016 PCP: [name], MD  Assessment/Plan: 1. Gross hematuria from prostatic bleeding secondary to antiplatelet therapy. Patient developed hematuria when catheter was placed during cardiac stenting last month which was felt to be related to BPH and trauma from catheter. His urine had cleared prior to discharge, but unfortunately, his hematuria has recurred. At this time, he continues to have evidence of gross hematuria in foley tubing. Dr. Wilson Singer, urology, was consulted and recommends holding antiplatelet therapy and reassessing. If hematuria continues, he may require fulguration.  Will continue flomax and finesteride.  Hgb 8.0 this morning, will repeat this afternoon.  ASA and brilinta are being held. Urine cx in process. Holding pharmacologic VTE ppx. Will perform bladder irrigation with NS PRN obstruction. 2. Acute blood loss anemia. Pt was transfused 2U pRBC on 4/7. Hgb was initially 8.7 but is now 8.0. Wll continue to monitor closely and transfuse as needed.  3. AKI. Likely prerenal, related to volume depletion. Creatinine continues to improve with fluids. BUN 17 and Cr 1.37. 4. Possible UTI. UA indicates possible infection. Will continue empiric Rocephin and follow up cultures.  5. Lethargy. Possibly related to dehydration vs possible infection. Appears to be improving. Continue to monitor.  6. Chest pain, CAD. Likely due to anemia, has been transfused. ASA / brilinta currently held due to bleed. Pt has a hx of NSTEMI and placement of 2DES to RCA last month. Troponin has remained unremarkable. Ideally, the pt will need to go back on anti-platelet agents, once his hematuria issues have stabilized.  7. COPD. Stable. Continue nebs PRN. Continue O2 given CAD and anemia 8. HTN. Currently running low in setting of active blood loss anemia. Antihypertensives being held, will resume as needed once stable  Code  Status: DNR DVT prophylaxis: SCDs Family Communication: discussed with patient and daughter at bedside Disposition Plan: discharge once improved   Consultants:  Urology  Procedures:  2U pRBC transfusion 4/7  Antibiotics:  Rocephin 4/8 >>  HPI/Subjective: Feeling well. Denies any pain or SOB. Per daughter at bedside, he remains lethargic.    Objective: Filed Vitals:   01/30/16 0500 01/30/16 0600  BP: 108/56 93/71  Pulse:    Temp:    Resp: 13 23    Intake/Output Summary (Last 24 hours) at 01/30/16 0709 Last data filed at 01/30/16 0617  Gross per 24 hour  Intake   2410 ml  Output   1750 ml  Net    660 ml   Filed Weights   01/28/16 2000 01/29/16 0400 01/30/16 0500  Weight: 81.8 kg (180 lb 5.4 oz) 81.8 kg (180 lb 5.4 oz) 82.8 kg (182 lb 8.7 oz)    Exam:   General: NAD, looks comfortable  Cardiovascular: RRR, S1, S2   Respiratory: clear bilaterally, No wheezing, rales or rhonchi   Abdomen: soft, non tender, no distention , bowel sounds normal. Foley cath in place. Urine in tubing is grossly bloody.   Musculoskeletal: No edema b/l  Data Reviewed: Basic Metabolic Panel:  Recent Labs Lab 01/26/16 0707 01/28/16 0710 01/28/16 1535 01/29/16 0658 01/30/16 0431  NA 141 139 133* 136 138  K 4.4 4.7 4.5 4.1 3.7  CL 110 107 103 110 111  CO2 20* 21*  GLUCOSE 107* 116* 124* 93 87  BUN 17 28* 28* 22* 17  CREATININE 1.39* 2.40* 2.19* 1.74* 1.37*  CALCIUM 8.7* 8.7* 8.5* 7.8* 7.9*    CBC:  Recent Labs  Lab 01/28/16 1535 01/29/16 0658 01/29/16 1610 01/30/16 0431  WBC 11.6* 9.1 8.9 12.0*  NEUTROABS 8.1* 5.9 5.7  --   HGB 7.1* 8.3* 8.7* 8.0*  HCT 20.8* 24.3* 25.3* 24.5*  MCV 88.1 86.8 87.5 89.7  PLT 300 204 200 215   Cardiac Enzymes:  Recent Labs Lab 01/28/16 1535 01/28/16 1930 01/29/16 0658  TROPONINI <0.03 <0.03 <0.03   BNP (last 3 results)  Recent Labs  01/01/16 1840  BNP 36.1     CBG:  Recent Labs Lab 01/29/16 0735   GLUCAP 83    Recent Results (from the past 240 hour(s))  MRSA PCR Screening     Status: None   Collection Time: 01/28/16  9:08 PM  Result Value Ref Range Status   MRSA by PCR NEGATIVE NEGATIVE Final    Comment:        The GeneXpert MRSA Assay (FDA approved for NASAL specimens only), is one component of a comprehensive MRSA colonization surveillance program. It is not intended to diagnose MRSA infection nor to guide or monitor treatment for MRSA infections.      Studies: Ct Head Wo Contrast  01/28/2016  CLINICAL DATA:  Worsening dementia x 1 day.  Hx of HTN,CAD,COPD EXAM: CT HEAD WITHOUT CONTRAST TECHNIQUE: Contiguous axial images were obtained from the base of the skull through the vertex without intravenous contrast. COMPARISON:  01/10/2016 Switch FINDINGS: The ventricles are normal in size, for the patient's age, and normal in configuration. There are no parenchymal masses or mass effect. There is no evidence of a cortical infarct. Not mild periventricular white matter hypoattenuation is noted consistent chronic microvascular ischemic change. There are no extra-axial masses or abnormal fluid collections. There is no intracranial hemorrhage. The visualized sinuses and mastoid air cells are clear. No skull lesion. Stable appearance from the prior head CT. IMPRESSION: 1. No acute intracranial abnormalities. 2. Age-appropriate volume loss. Mild chronic microvascular ischemic change. Electronically Signed   By: Amie Portland M.D.   On: 01/28/2016 16:37   Dg Chest Portable 1 View  01/28/2016  CLINICAL DATA:  75 year old male with increased chest pain, blood pressure running low. Initial encounter. EXAM: PORTABLE CHEST 1 VIEW COMPARISON:  01/05/2016 and earlier. FINDINGS: Portable AP upright view at 1542 hours. Stable lung volumes. Stable mild cardiomegaly. Allowing for portable technique, the lungs are clear. No pneumothorax. IMPRESSION: No acute cardiopulmonary abnormality. Electronically  Signed   By: Odessa Fleming M.D.   On: 01/28/2016 15:57    Scheduled Meds: . atorvastatin  80 mg Oral q1800  . cefTRIAXone (ROCEPHIN)  IV  1 g Intravenous Q24H  . feeding supplement (ENSURE ENLIVE)  237 mL Oral BID BM  . finasteride  5 mg Oral Daily  . sodium chloride flush  3 mL Intravenous Q12H  . tamsulosin  0.8 mg Oral QHS  . tiotropium  1 capsule Inhalation Daily   Continuous Infusions: . sodium chloride 100 mL/hr at 01/30/16 1610    Principal Problem:   Acute blood loss anemia Active Problems:   COPD (chronic obstructive pulmonary disease) (HCC)   Essential hypertension   AKI (acute kidney injury) (HCC)   Dementia   Chest pain   Gross hematuria   CAD (coronary artery disease)   Pain in the chest    Time spent: 25 minutes    Erick Blinks, MD. [facility] Pager [phone]. If 7PM-7AM, please contact night-coverage at [email or web address], password [facility] 01/30/2016, 7:09 AM  LOS: 2 days     By signing my name  below, I, Burnett HarryJennifer Gregorio, attest that this documentation has been prepared under the direction and in the presence of [facility] HospitalJehanzeb Qais Jowers. MD Electronically Signed: Burnett HarryJennifer Gregorio, Scribe. 01/30/2016 9:32am   I, Dr. Erick BlinksJehanzeb Liberty Stead, personally performed the services described in this documentaiton. All medical record entries made by the scribe were at my direction and in my presence. I have reviewed the chart and agree that the record reflects my personal performance and is accurate and complete  Erick BlinksJehanzeb Arieanna Pressey, MD, 01/30/2016 9:47 AM

## 2016-01-31 ENCOUNTER — Encounter (HOSPITAL_COMMUNITY): Admission: RE | Admit: 2016-01-31 | Payer: Medicare Other | Source: Skilled Nursing Facility | Admitting: *Deleted

## 2016-01-31 DIAGNOSIS — Z7902 Long term (current) use of antithrombotics/antiplatelets: Secondary | ICD-10-CM | POA: Diagnosis not present

## 2016-01-31 DIAGNOSIS — N179 Acute kidney failure, unspecified: Secondary | ICD-10-CM | POA: Diagnosis not present

## 2016-01-31 DIAGNOSIS — R310 Gross hematuria: Secondary | ICD-10-CM | POA: Diagnosis not present

## 2016-01-31 DIAGNOSIS — R079 Chest pain, unspecified: Secondary | ICD-10-CM | POA: Diagnosis not present

## 2016-01-31 DIAGNOSIS — N170 Acute kidney failure with tubular necrosis: Secondary | ICD-10-CM | POA: Diagnosis not present

## 2016-01-31 DIAGNOSIS — I2510 Atherosclerotic heart disease of native coronary artery without angina pectoris: Secondary | ICD-10-CM

## 2016-01-31 DIAGNOSIS — D62 Acute posthemorrhagic anemia: Secondary | ICD-10-CM | POA: Diagnosis not present

## 2016-01-31 DIAGNOSIS — J438 Other emphysema: Secondary | ICD-10-CM | POA: Diagnosis not present

## 2016-01-31 LAB — CBC
HEMATOCRIT: 23.1 % — AB (ref 39.0–52.0)
HEMOGLOBIN: 7.6 g/dL — AB (ref 13.0–17.0)
MCH: 29.8 pg (ref 26.0–34.0)
MCHC: 32.9 g/dL (ref 30.0–36.0)
MCV: 90.6 fL (ref 78.0–100.0)
PLATELETS: 202 10*3/uL (ref 150–400)
RBC: 2.55 MIL/uL — AB (ref 4.22–5.81)
RDW: 14.2 % (ref 11.5–15.5)
WBC: 8.3 10*3/uL (ref 4.0–10.5)

## 2016-01-31 LAB — BASIC METABOLIC PANEL
ANION GAP: 4 — AB (ref 5–15)
BUN: 9 mg/dL (ref 6–20)
CHLORIDE: 111 mmol/L (ref 101–111)
CO2: 22 mmol/L (ref 22–32)
Calcium: 7.8 mg/dL — ABNORMAL LOW (ref 8.9–10.3)
Creatinine, Ser: 1.04 mg/dL (ref 0.61–1.24)
GFR calc Af Amer: >60 mL/min (ref >60)
GLUCOSE: 92 mg/dL (ref 65–99)
POTASSIUM: 3.5 mmol/L (ref 3.5–5.1)
Sodium: 137 mmol/L (ref 135–145)

## 2016-01-31 LAB — GLUCOSE, CAPILLARY: GLUCOSE-CAPILLARY: 85 mg/dL (ref 65–99)

## 2016-01-31 LAB — PREPARE RBC (CROSSMATCH)

## 2016-01-31 MED ORDER — SODIUM CHLORIDE 0.9 % IV SOLN
Freq: Once | INTRAVENOUS | Status: AC
  Administered 2016-01-31: 14:00:00 via INTRAVENOUS

## 2016-01-31 NOTE — Progress Notes (Signed)
[facility] PROGRESS NOTE  Elicia Lamplmer W Matarese ZOX:[id number] DOB: [birth year] DOA: 01/28/2016 PCP: Selinda FlavinHOWARD, [name], MD  Assessment/Plan: 1. Gross hematuria from prostatic bleeding secondary to antiplatelet therapy. Patient developed hematuria when catheter was placed during cardiac stenting last month which was felt to be related to BPH and trauma from catheter. His urine had cleared prior to discharge, but unfortunately, his hematuria has recurred. At this time, he continues to have evidence of gross hematuria in foley tubing. Dr. Wilson SingerWren, urology, was consulted and recommends holding antiplatelet therapy and reassessing. If hematuria continues, he may require fulguration. Continue flomax and finesteride. ASA and brilinta are being held. Urine cx in process. Holding pharmacologic VTE ppx. Will perform bladder irrigation with NS PRN obstruction. 2. Acute blood loss anemia. Pt was transfused 2U PRBC on 4/7. Hgb was initially 8.7 but has trended down to 7.6. Will transfuse 1 unit PRBC and continue to monitor closely. 3. AKI. Resolved with IV fluids. Likely prerenal, related to volume depletion. Will discontinue IVF.  4. Possible UTI. UA indicates possible infection. Will continue empiric Rocephin and follow up cultures.  5. Lethargy. Possibly related to dehydration vs possible infection. Appears to be improving. Continue to monitor.  6. Chest pain, CAD. Likely due to anemia, has been transfused. ASA / brilinta currently held due to bleed. Pt has a hx of NSTEMI and placement of 2DES to RCA last month. Troponin has remained unremarkable. Ideally, the pt will need to go back on anti-platelet agents, once his hematuria issues have stabilized. Will consult cardiology today.  7. COPD. Stable. Continue nebs PRN. Continue O2 given CAD and anemia 8. HTN. Currently running low in setting of active blood loss anemia. Antihypertensives being held, will resume as needed once stable. 9. Severe malnutrition in the context of  acute illness/injury. Nutrition following.   Code Status: DNR DVT prophylaxis: SCDs Family Communication: Discussed with patient and daughter at bedside Disposition Plan: discharge once improved   Consultants:  Urology  Cardiology   Procedures:  2U PRBC transfusion 4/7  1U PRBC transfusion 4/10  Antibiotics:  Rocephin 4/8 >>  HPI/Subjective: Per daughter bedside he is still mildly confused. He is eating better today.  He denies trouble breathing or chest pain at the moment.   Objective: Filed Vitals:   01/30/16 2206 01/31/16 0535  BP: 150/55 145/58  Pulse: 69 61  Temp: 98.2 F (36.8 C) 98.8 F (37.1 C)  Resp: 20 20    Intake/Output Summary (Last 24 hours) at 01/31/16 0828 Last data filed at 01/31/16 0536  Gross per 24 hour  Intake    870 ml  Output   1400 ml  Net   -530 ml   Filed Weights   01/28/16 2000 01/29/16 0400 01/30/16 0500  Weight: 81.8 kg (180 lb 5.4 oz) 81.8 kg (180 lb 5.4 oz) 82.8 kg (182 lb 8.7 oz)    Exam:   General: NAD, looks comfortable  Cardiovascular: RRR. No murmur, rubs, or gallops.  Respiratory: clear bilaterally, No wheezing, rales or rhonchi   Abdomen: soft, non tender, no distention , bowel sounds normal. Foley cath in place. Urine in tubing is grossly bloody.   Musculoskeletal: Trace edema b/l  Data Reviewed: Basic Metabolic Panel:  Recent Labs Lab 01/28/16 0710 01/28/16 1535 01/29/16 0658 01/30/16 0431 01/31/16 0607  NA 139 133* 136 138 137  K 4.7 4.5 4.1 3.7 3.5  CL 107 103 110 111 111  CO2 24 23 20* 21* 22  GLUCOSE 116* 124* 93 87 92  BUN 28* 28* 22* 17 9  CREATININE 2.40* 2.19* 1.74* 1.37* 1.04  CALCIUM 8.7* 8.5* 7.8* 7.9* 7.8*    CBC:  Recent Labs Lab 01/28/16 1535 01/29/16 0658 01/29/16 1610 01/30/16 0431 01/30/16 1353 01/31/16 0607  WBC 11.6* 9.1 8.9 12.0* 10.9* 8.3  NEUTROABS 8.1* 5.9 5.7  --   --   --   HGB 7.1* 8.3* 8.7* 8.0* 8.8* 7.6*  HCT 20.8* 24.3* 25.3* 24.5* 26.5* 23.1*  MCV  88.1 86.8 87.5 89.7 89.8 90.6  PLT 300 204 200 215 218 202   Cardiac Enzymes:  Recent Labs Lab 01/28/16 1535 01/28/16 1930 01/29/16 0658  TROPONINI <0.03 <0.03 <0.03   BNP (last 3 results)  Recent Labs  01/01/16 1840  BNP 36.1     CBG:  Recent Labs Lab 01/29/16 0735 01/30/16 0728 01/31/16 0756  GLUCAP 83 87 85    Recent Results (from the past 240 hour(s))  Urine culture     Status: None (Preliminary result)   Collection Time: 01/28/16  8:50 PM  Result Value Ref Range Status   Specimen Description URINE, CATHETERIZED  Final   Special Requests NONE  Final   Culture   Final    TOO YOUNG TO READ Performed at [facility]    Report Status PENDING  Incomplete  MRSA PCR Screening     Status: None   Collection Time: 01/28/16  9:08 PM  Result Value Ref Range Status   MRSA by PCR NEGATIVE NEGATIVE Final    Comment:        The GeneXpert MRSA Assay (FDA approved for NASAL specimens only), is one component of a comprehensive MRSA colonization surveillance program. It is not intended to diagnose MRSA infection nor to guide or monitor treatment for MRSA infections.      Studies: No results found.  Scheduled Meds: . atorvastatin  80 mg Oral q1800  . cefTRIAXone (ROCEPHIN)  IV  1 g Intravenous Q24H  . feeding supplement (ENSURE ENLIVE)  237 mL Oral BID BM  . finasteride  5 mg Oral Daily  . sodium chloride flush  3 mL Intravenous Q12H  . tamsulosin  0.8 mg Oral QHS  . tiotropium  1 capsule Inhalation Daily   Continuous Infusions: . sodium chloride 100 mL/hr at 01/30/16 1730    Principal Problem:   Acute blood loss anemia Active Problems:   COPD (chronic obstructive pulmonary disease) (HCC)   Essential hypertension   AKI (acute kidney injury) (HCC)   Dementia   Chest pain   Gross hematuria   CAD (coronary artery disease)   Pain in the chest   Protein-calorie malnutrition, severe    Time spent: 25 minutes    Erick Blinks, MD. [facility] Pager [phone]. If 7PM-7AM, please contact night-coverage at [email or web address], password [facility] 01/31/2016, 8:28 AM  LOS: 3 days    By signing my name below, I, [name], attest that this documentation has been prepared under the direction and in the presence of Erick Blinks, MD. Electronically signed: [name], Scribe. 01/31/2016 12:08pm    I, Dr. Erick Blinks, personally performed the services described in this documentaiton. All medical record entries made by the scribe were at my direction and in my presence. I have reviewed the chart and agree that the record reflects my personal performance and is accurate and complete  Erick Blinks, MD, 01/31/2016 12:25 PM

## 2016-01-31 NOTE — Consult Note (Addendum)
CARDIOLOGY CONSULT NOTE   Patient ID: Lee Chang MRN: [id number] DOB/AGE: 01-13-1941 75 y.o.  Admit Date: 01/28/2016 Referring Physician: [facility] Primary Physician: Selinda FlavinHOWARD, [name], MD Consulting Cardiologist: Dina RichBranch, Jonathan MD Primary Cardiologist MCDowell,Samuel MD Reason for Consultation: Chest Pain  Clinical Summary Lee Chang is a 75 y.o.male with known history of CAD with DES to RCA 12/2015, formerly on DAPT,  hypertension, COPD and dementia, who presented to ER from SNF with worsening confusion and chest pain. He had [facility] and began to complain of chest pressure and weakness.   On arrival to ER, BP 118/58, HR 62, afebrile. U/A noted hematuria with positive nitrates. He was found to be anemic with Hgb 7.1, Hct 20.8. WBC 11.6. Na 133, Glucoe 124 creatinine 2.19. CT of the head was negative for acute intracranial abnormalities. CXR was negative for cardiopulmonary abnormality. Troponin 0.03. EKG appeared to be NSR with significant artifact noted.   Due  to hematuria and anemia,  Brilinta and ASA were discontinued at the recommendation of Dr. Wilson SingerWren, urology.  He has been seen by urology in [place] and has had removal a several blood clots found to be related to BPH. Urology is to see him during this admission. He was transfused 2 units of PRB's.   Patient has dementia and is a poor historian. Daughter at bedside provides some history. [facility].   Allergies  Allergen Reactions  . Asa [Aspirin]     Bleeding ulcer prior to 2007    Medications Scheduled Medications: . atorvastatin  80 mg Oral q1800  . cefTRIAXone (ROCEPHIN)  IV  1 g Intravenous Q24H  . feeding supplement (ENSURE ENLIVE)  237 mL Oral BID BM  . finasteride  5 mg Oral Daily  . sodium chloride flush  3 mL Intravenous Q12H  . tamsulosin  0.8 mg Oral QHS  . tiotropium  1 capsule Inhalation Daily    Infusions: . sodium chloride 100 mL/hr  at 01/30/16 1730    PRN Medications: acetaminophen, albuterol, alum & mag hydroxide-simeth, bisacodyl, HYDROcodone-acetaminophen, nitroGLYCERIN, ondansetron **OR** ondansetron (ZOFRAN) IV, polyethylene glycol, sodium chloride irrigation   Past Medical History  Diagnosis Date  . Ulcer   . Asthma   . Essential hypertension   . NSTEMI (non-ST elevated myocardial infarction) (HCC) 12/31/2015    Echo 01/01/16 (pre-CATH-PCI): EF 60-65%, no RWMA.   . 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 Moderate OM1 35% & midLAD 45%  . 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 Dyslipidemia, goal LDL below 70 01/02/2016  . COPD (chronic obstructive pulmonary disease) (HCC) 01/01/2016  . Lung nodule 01/01/2016  . Dementia   . BPH (benign prostatic hypertrophy) with urinary obstruction   . Acute urinary retention   . Elevated PSA     Past Surgical History  Procedure Laterality Date  . Hernia repair    . Cardiac catheterization N/A 01/01/2016    Procedure: Left Heart Cath and Coronary Angiography;  Surgeon: Marykay Lexavid W Harding, MD;  [facility];  Service: Cardiovascular;  Laterality: N/A;  . Cardiac catheterization Right 01/01/2016    Procedure: Coronary Stent  Intervention;  Surgeon: Marykay Lex, MD;  Location[facility] INV[facility] CV LAB;  Service: Cardiovascular;  Laterality: Right;  . Tonsillectomy    . Prostate biopsy      Family History  Problem Relation Age of Onset  . Diabetes Brother   . Cancer Neg Hx   . Stroke Neg Hx   . Heart disease Neg Hx     Social History Lee Chang reports that he quit smoking about 11 years ago. He does not have any smokeless tobacco history on file. Lee Chang reports that he does not  drink alcohol.  Review of Systems Complete review of systems are found to be negative unless outlined in H&P above.  Physical Examination Blood pressure 145/58, pulse 61, temperature 98.8 F (37.1 C), temperature source Oral, resp. rate 20, height  (1.676 m), weight 182 lb 8.7 oz (82.8 kg), SpO2 96 %.  Intake/Output Summary (Last 24 hours) at 01/31/16 1117 Last data filed at 01/31/16 0536  Gross per 24 hour  Intake    520 ml  Output   1400 ml  Net   -880 ml    Telemetry:  GEN: No acute distress. HEENT: Conjunctiva and lids normal, oropharynx clear with moist mucosa. Neck: Supple, no elevated JVP or carotid bruits, no thyromegaly. Lungs: Mild inspiratory wheezes, crackles at the bases on the left.  Cardiac: Regular rate and rhythm, distant heart sounds.no S3 or significant systolic murmur, no pericardial rub. Abdomen: Soft, nontender, no hepatomegaly, bowel sounds present, no guarding or rebound. Extremities: No pitting edema, distal pulses 2+. Skin: Warm and dry. Musculoskeletal: No kyphosis. Neuropsychiatric: Alert and confused at times, difficult memory, affect grossly appropriate.  Prior Cardiac Testing/Procedures 1.Cardiac Cath 01/01/2016 1. Prox RCA lesion, 99% stenosed. Mid RCA-1 lesion, 60% stenosed. Distal Mid RCA-2 lesion, 80% stenosed. Post intervention wth 2 overlapping Synergy DES (3.0 mm x 28 mm & 3.5 mm x 38 mm) stents, there is a 0% residual stenosis. 2. RPDA lesion, 100% stenosed. Likely distal thomboembolism from the original MI 3. Prox LAD to Mid LAD lesion, 45% stenosed. 4. 1st Mrg lesion, 35% stenosed. 5. The left ventricular systolic function is normal with apical inferior hypokinesis - consistent with occluded distal rPDA   Severe Single Vessel CAD involving the prox-mid RCA with 99% ulcerated-thrombotic occlusion of the prox RCA with diffuse 60-80% mid RCA disease. Thromboembolic occlusion of the distal rPDA - with some improvement following upstream  PCI.  Successful extensive PCI of prox-mid RCA with 2 overlapping Synergy DES, post-dilated in a tapered fashion from 4.2 mm to 3.2 mm.   2. Echocardiogram Left ventricle: The cavity size was normal. Systolic function was  normal. The estimated ejection fraction was in the range of 60%  to 65%. Wall motion was normal; there were no regional wall  motion abnormalities. - Aortic valve: Valve area (VTI): 2.88 cm^2. Valve area (Vmax):  3.11 cm^2. Valve area (Vmean): 2.62 cm^2.    Lab Results  Basic Metabolic Panel:  Recent Labs Lab 01/28/16 0710 01/28/16 1535 01/29/16 0658 01/30/16 0431 01/31/16 0607  NA 139 133* 136 138 137  K 4.7 4.5 4.1 3.7 3.5  CL 107 103 110 111 111  CO2 24 23 20* 21* 22  GLUCOSE 116* 124* 93 87 92  BUN 28* 28* 22* 17 9  CREATININE 2.40* 2.19* 1.74* 1.37* 1.04  CALCIUM 8.7* 8.5* 7.8* 7.9* 7.8*    Liver Function Tests: No results for input(s): AST, ALT, ALKPHOS, BILITOT, PROT, ALBUMIN in the last 168  hours.  CBC:  Recent Labs Lab 01/28/16 1535 01/29/16 0658 01/29/16 1610 01/30/16 0431 01/30/16 1353 01/31/16 0607  WBC 11.6* 9.1 8.9 12.0* 10.9* 8.3  NEUTROABS 8.1* 5.9 5.7  --   --   --   HGB 7.1* 8.3* 8.7* 8.0* 8.8* 7.6*  HCT 20.8* 24.3* 25.3* 24.5* 26.5* 23.1*  MCV 88.1 86.8 87.5 89.7 89.8 90.6  PLT 300 204 200 215 218 202    Cardiac Enzymes:  Recent Labs Lab 01/28/16 1535 01/28/16 1930 01/29/16 0658  TROPONINI <0.03 <0.03 <0.03    Radiology:: Portable AP upright view at 1542 hours. Stable lung volumes. Stable mild cardiomegaly. Allowing for portable technique, the lungs are clear. No pneumothorax. IMPRESSION: No acute cardiopulmonary abnormality.  ECG: SR with significant artifact.   Impression and Recommendations  1. CAD: DES to RCA in March of 2017, with DAPT. Unfortunately, he began to have frank hematuria, less than one month post PCI and induction of DAPT. Hgb dropped to 7.1. He has been taken off of DAPT in this  setting.   Difficult situation as PCI completed one month ago, but with frank bleeding and blood loss we have no choice than to stop the antiplatelet therapy for now. He is to be seen by urology for further evaluate.   Recommend restarting coreg but at lower dose for BP and HR control He is on 12.5 mg BID now, but will decrease to 3.125 mg BID and titrate up if possible. Continue statin. He has no further complaints of chest pain currently.   2. Hypertension:  BP has been labile but is now normalized. No ACE due to renal insufficiency.   3. Hematuria: To see Urology today.   Signed: Bettey Mare. Lawrence NP [facility]  01/31/2016, 11:17 AM Co-Sign MD  Patient seen and discussed with NP Lawerence, I agree with her documentation above. 75 yo male history of CAD with NSTEMI 12/2015 receiving 2 DES to RCA, COPD, HTN, HL admitted with severe hematuria. Hgb dropped from 12.7 to 7 on admission. He had been on DAPT since stents placed 12/2015. He was transfused 2 units of pRBCs with Hgb to 8.7, however has trended down to 7.6.  Troponins negative, EKG SR no acute ischemic changes though poor baseline.  Very difficult situation in the setting of recent DES stent placement x 2 with severe hematuria with marked drop in Hgb with no great management options. His DAPT has been stopped on admission 01/28/16. He is at high risk for stent thrombosis in this window of time, would restart ASA as early as possible once Hgb stabilizes, and can change to plavix  daily for less potent platelet inhibition given recent bleeding. Hgb down from 8.8 to 7.6 overnight, he remains hemodynamically stable.    Dominga Ferry MD

## 2016-01-31 NOTE — Care Management Obs Status (Signed)
MEDICARE OBSERVATION STATUS NOTIFICATION   Patient Details  Name: Lee Chang MRN: [id number] Date of Birth: 12-02-1940   Medicare Observation Status Notification Given:  Yes    Adonis HugueninBerkhead, Yeraldy Spike L, RN 01/31/2016, 11:35 AM

## 2016-02-01 ENCOUNTER — Other Ambulatory Visit: Payer: Self-pay | Admitting: *Deleted

## 2016-02-01 ENCOUNTER — Other Ambulatory Visit: Payer: Self-pay

## 2016-02-01 DIAGNOSIS — N170 Acute kidney failure with tubular necrosis: Secondary | ICD-10-CM | POA: Diagnosis not present

## 2016-02-01 DIAGNOSIS — I2510 Atherosclerotic heart disease of native coronary artery without angina pectoris: Secondary | ICD-10-CM | POA: Diagnosis not present

## 2016-02-01 DIAGNOSIS — N3289 Other specified disorders of bladder: Secondary | ICD-10-CM | POA: Diagnosis not present

## 2016-02-01 DIAGNOSIS — D62 Acute posthemorrhagic anemia: Secondary | ICD-10-CM

## 2016-02-01 DIAGNOSIS — N179 Acute kidney failure, unspecified: Secondary | ICD-10-CM | POA: Diagnosis not present

## 2016-02-01 DIAGNOSIS — R310 Gross hematuria: Secondary | ICD-10-CM | POA: Diagnosis not present

## 2016-02-01 DIAGNOSIS — Z7902 Long term (current) use of antithrombotics/antiplatelets: Secondary | ICD-10-CM | POA: Diagnosis not present

## 2016-02-01 DIAGNOSIS — R079 Chest pain, unspecified: Secondary | ICD-10-CM | POA: Diagnosis not present

## 2016-02-01 LAB — TYPE AND SCREEN
ABO/RH(D): O POS
Antibody Screen: NEGATIVE
UNIT DIVISION: 0
UNIT DIVISION: 0
Unit division: 0

## 2016-02-01 LAB — CBC
HCT: 25.8 % — ABNORMAL LOW (ref 39.0–52.0)
Hemoglobin: 8.9 g/dL — ABNORMAL LOW (ref 13.0–17.0)
MCH: 30.1 pg (ref 26.0–34.0)
MCHC: 34.5 g/dL (ref 30.0–36.0)
MCV: 87.2 fL (ref 78.0–100.0)
PLATELETS: 192 10*3/uL (ref 150–400)
RBC: 2.96 MIL/uL — ABNORMAL LOW (ref 4.22–5.81)
RDW: 15.1 % (ref 11.5–15.5)
WBC: 7.7 10*3/uL (ref 4.0–10.5)

## 2016-02-01 LAB — GLUCOSE, CAPILLARY: GLUCOSE-CAPILLARY: 86 mg/dL (ref 65–99)

## 2016-02-01 LAB — URINE CULTURE: Culture: >=100000 — AB

## 2016-02-01 LAB — PROCALCITONIN: Procalcitonin: <0.1 ng/mL

## 2016-02-01 MED ORDER — CARVEDILOL 3.125 MG PO TABS
6.2500 mg | ORAL_TABLET | Freq: Two times a day (BID) | ORAL | Status: DC
  Administered 2016-02-01 – 2016-02-04 (×6): 6.25 mg via ORAL
  Filled 2016-02-01 (×6): qty 2

## 2016-02-01 NOTE — Care Management Note (Signed)
Case Management Note  Patient Details  Name: Lee Chang MRN: [id number] Date of Birth: 03/25/1941  Subjective/Objective:             Spoke with CSW who stated that she had spoken with the family about placement and that daughter Lee Chang would want to take patient home with 24 hour supervision. Contacted the Daughter Lee Chang via telephone who stated that  She would like to take patient home and that the patient would have walker wheelchair and BSC. Daughter offered choice of HH Providers, she chose [facility] . [facility]   Action/Plan: Home with Home Health.   Expected Discharge Date:                  Expected Discharge Plan:  Home w Home Health Services  In-House Referral:     Discharge planning Services  CM Consult  Post Acute Care Choice:  NA Choice offered to:  Adult Children  DME Arranged:  N/A DME Agency:  NA  HH Arranged:  RN, Social Work [employer] Agency:  [facility]  Status of Service:  Completed, signed off  Medicare Important Message Given:    Date Medicare IM Given:    Medicare IM give by:    Date Additional Medicare IM Given:    Additional Medicare Important Message give by:     If discussed at Long Length of Stay Meetings, dates discussed:    Additional Comments:  Adonis HugueninBerkhead, Cambrie Sonnenfeld L, RN 02/01/2016, 11:54 AM

## 2016-02-01 NOTE — Patient Outreach (Signed)
Encounter Opened in error. Lee SpittleMary E. Albertha GheeNiemczura, RN, BSN, CCM  [facility] [employer] Care Manager [phone]

## 2016-02-01 NOTE — Progress Notes (Signed)
Primary Cardiologist: Nona DellMcDowell, Samuel MD  Cardiology Specific Problem List: 1. CAD with recent PCI and stent placement to RCA on DAPT 2. Hypertension   Subjective:    No chest pain or dyspnea. Continues hematuria.   Objective:   Temp:  [98.2 F (36.8 C)-98.7 F (37.1 C)] 98.3 F (36.8 C) (04/11 0500) Pulse Rate:  [65-67] 65 (04/11 0500) Resp:  [15-16] 16 (04/11 0500) BP: (134-178)/(57-96) 148/58 mmHg (04/11 0500) SpO2:  [96 %-99 %] 96 % (04/11 0500) Last BM Date: 01/30/16  Filed Weights   01/28/16 2000 01/29/16 0400 01/30/16 0500  Weight: 180 lb 5.4 oz (81.8 kg) 180 lb 5.4 oz (81.8 kg) 182 lb 8.7 oz (82.8 kg)    Intake/Output Summary (Last 24 hours) at 02/01/16 1019 Last data filed at 02/01/16 0800  Gross per 24 hour  Intake    785 ml  Output   1800 ml  Net  -1015 ml    Telemetry: SR with frequent PCV's in couplets.  Exam:  General: No acute distress.  HEENT: Conjunctiva and lids normal, oropharynx clear.  Lungs: Clear to auscultation, nonlabored.  Cardiac: No elevated JVP or bruits. RRR, no gallop or rub.   Abdomen: Normoactive bowel sounds, nontender, nondistended.  Extremities: No pitting edema, distal pulses full.  Neuropsychiatric: Alert and oriented x3, affect appropriate.   Lab Results:  Basic Metabolic Panel:  Recent Labs Lab 01/29/16 0658 01/30/16 0431 01/31/16 0607  NA 136 138 137  K 4.1 3.7 3.5  CL 110 111 111  CO2 20* 21* 22  GLUCOSE 93 87 92  BUN 22* 17 9  CREATININE 1.74* 1.37* 1.04  CALCIUM 7.8* 7.9* 7.8*     CBC:  Recent Labs Lab 01/30/16 1353 01/31/16 0607 02/01/16 0621  WBC 10.9* 8.3 7.7  HGB 8.8* 7.6* 8.9*  HCT 26.5* 23.1* 25.8*  MCV 89.8 90.6 87.2  PLT 218 202 192    Cardiac Enzymes:  Recent Labs Lab 01/28/16 1535 01/28/16 1930 01/29/16 0658  TROPONINI <0.03 <0.03 <0.03    Coagulation:  Recent Labs Lab 01/28/16 1930  INR 1.12    Medications:   Scheduled Medications: . atorvastatin   80 mg Oral q1800  . carvedilol  6.25 mg Oral BID WC  . cefTRIAXone (ROCEPHIN)  IV  1 g Intravenous Q24H  . feeding supplement (ENSURE ENLIVE)  237 mL Oral BID BM  . finasteride  5 mg Oral Daily  . sodium chloride flush  3 mL Intravenous Q12H  . tamsulosin  0.8 mg Oral QHS  . tiotropium  1 capsule Inhalation Daily       PRN Medications: acetaminophen, albuterol, alum & mag hydroxide-simeth, bisacodyl, HYDROcodone-acetaminophen, nitroGLYCERIN, ondansetron **OR** ondansetron (ZOFRAN) IV, polyethylene glycol, sodium chloride irrigation   Assessment and Plan:   1. CAD: PCI to RCA in March 2017. Has been taken off of DAPT in the setting of hematuria and anemia. Hgb is stable. Still with frank bleeding. . After discussion with Dr. Wyline MoodBranch, may restart antiplatelet once urology recommends but use Plavix instead of Brilinta as this has a lower risk of bleeding. Continue coreg at 6.25 mg BID and statin.  2. Hypertension: BP is stable. No changes in regimen at this time until urology evaluates further.   Bettey MareKathryn M. Lawrence NP AACC  02/01/2016, 10:19 AM   Patient seen and discussed with NP Lyman BishopLawrence. S/p 1 unit of pRBCs yesterday with appropriate response, will follow Hgb trend. Still with hematuria. Plan to restart ASA and then plavix once  Hgb stable. We will follow chart and labs from from a distance as to when to restart therapy, no further cardiac recs at this time.    Dominga Ferry MD

## 2016-02-01 NOTE — Consult Note (Signed)
   [facility]   02/01/2016  Elicia Lamplmer W [place] 06-24-1941 [id number]  Spoke with patient and his brother at bedside regarding [facility]. Patient not sure if he wishes to participate with [facility] at this time. Patient's brother given [facility] broch[facility] and contact information for patient daughter to review, they voice appreciation of information.   Inpatient case manager aware that [facility] has not [facility] at this time, but they were offered. Of note, [facility] Care [facility] services would not replace or interfere with any services that are arranged by inpatient case management or social work. For additional questions or referrals please contact:  Alben SpittleMary E. Albertha GheeNiemczura, RN, BSN, [facility]  [facility] Hospital Liaison [phone]

## 2016-02-01 NOTE — Progress Notes (Signed)
LCSW was [facility] Corrie DandyMary, [facility]. PT evaluated patient and reports he is able to go home with 24 hour supervision and home health. Daughter in room at time of PT evaluation and reports she can manage patient at home with home health.  [facility] made aware of plan and disposition home. CM made aware of [facility]  No other needs at this time.  DC home with home health.  Deretha EmoryHannah Stefania Goulart LCSW, MSW Clinical Social Work: [employer] [phone]

## 2016-02-01 NOTE — Evaluation (Signed)
Physical Therapy Evaluation Patient Details Name: Lee Chang MRN: [id number] DOB: 1940-11-12 Today's Date: 02/01/2016   History of Present Illness  Lee Chang is a 75 y.o. male with PMH of hypertension, COPD, dementia, and coronary artery disease with 2 drug-eluting stents placed to the RCA 1 month ago and now presenting from his SNF with worsening confusion and chest pain just prior to arrival. Patient had reportedly been quite independent until just over 1 month ago when he suffered a non-STEMI and underwent catheterization with 2 DESs placed to the RCA.  Lee Chang also had gross hematuria, which has since returned.  Head CT is negative for acute intracranial abnormality and chest x-ray demonstrates no acute cardiopulmonary processes. Upon admission hemoglobin was 7.1, but today  H&H is 8.9/30.   Clinical Impression  Lee Chang received in bed, dtr Angelique Blonderenise present, and Lee Chang is agreeable to Lee Chang evaluation.  Lee Chang required Min A for all functional bed mobility, and Min/Mod A for sit<>stand with RW from the bed.  He ambulated 7920ft with RW and CGA, but was limited due to fatigue.  Lee [facility], however, he would like to return home.  Spoke with Dtr, Angelique Blonderenise, who states she can provide 24/7 supervision/assistance, and understands and agrees with recommendations for HHPT.      Follow Up Recommendations Home health Lee Chang    Equipment Recommendations  None recommended by Lee Chang    Recommendations for Other Services OT consult     Precautions / Restrictions Precautions Precautions: Fall Restrictions Weight Bearing Restrictions: No      Mobility  Bed Mobility Overal bed mobility: Needs Assistance Bed Mobility: Supine to Sit     Supine to sit: Min assist;HOB elevated     General bed mobility comments: Prior to transfer, Lee Chang requested to have undergarments, and pants donned.  Lee Chang required increased time, and Min A with bed pad to get hips scooted to the EOB.    Transfers Overall  transfer level: Needs assistance Equipment used: Rolling walker (2 wheeled) Transfers: Sit to/from Stand Sit to Stand: Mod assist;Min assist         General transfer comment: Lee Chang required vc's for safe hand placement on the bed.  Ambulation/Gait Ambulation/Gait assistance: Min guard Ambulation Distance (Feet): 20 Feet Assistive device: Rolling walker (2 wheeled) Gait Pattern/deviations: Shuffle;Step-through pattern;Narrow base of support   Gait velocity interpretation: <1.8 ft/sec, indicative of risk for recurrent falls General Gait Details: Lee Chang demonstrates head fwd position, but able to correct with vc's.  Slow cadence, and fatigues quickly.    Stairs            Wheelchair Mobility    Modified Rankin (Stroke Patients Only)       Balance Overall balance assessment: Needs assistance Sitting-balance support: Single extremity supported Sitting balance-Leahy Scale: Good Sitting balance - Comments: Lee Chang ws able to cross 1 leg over the other and pull up in figure 4 position to check the bottom of his foot, which was bothering him.     Standing balance support: Bilateral upper extremity supported Standing balance-Leahy Scale: Fair                               Pertinent Vitals/Pain Pain Assessment: No/denies pain    Home Living Family/patient expects to be discharged to:: Private residence Living Arrangements: Alone Available Help at Discharge: Family (Lee Chang's Dtr, Angelique Blonderenise, lives next door and is available to assist as needed. )  Type of Home: House Home Access: Level entry Angelique Blonder reports that where he will go in, there are no steps. )     Home Layout: Two level Home Equipment: Walker - 2 wheels;Cane - single point;Bedside commode;Wheelchair - manual Angelique Blonder stated they can borrow the W/c. ) Additional Comments: Dtr, Angelique Blonder, states that she can provide 24/7 supervision/assistance at his house if needed.      Prior Function Level of Independence: Needs  assistance   Gait / Transfers Assistance Needed: Lee Chang required assistance of a RW, and mostly mobilized in a w/c per the Lee Chang and dtr  ADL's / Homemaking Assistance Needed: Lee Chang requires minimal assistance for dressing, and the staff and [facility] assist him with bathing.   Comments: Lee Chang was an [facility], however he was independent with ADL's and IADL's back in March until his recent NSTEMI.  Since his NSTEMI he has required assistance for dressing, and bathing, and states he has mostly used a w/c for mobilization at the [facility], but uses a RW when in Lee Chang.      Hand Dominance        Extremity/Trunk Assessment   Upper Extremity Assessment: Generalized weakness           Lower Extremity Assessment: Generalized weakness      Cervical / Trunk Assessment:  (Lee Chang has tendancey to hold head down, and required vc's to look fwd. )  Communication   Communication: Other (comment) (redirection required due to dementia, with increased repetition. )  Cognition Arousal/Alertness: Awake/alert Behavior During Therapy: WFL for tasks assessed/performed Overall Cognitive Status: History of cognitive impairments - at baseline Area of Impairment: Memory;Problem solving;Safety/judgement Orientation Level: Person;Place;Time   Memory: Decreased short-term memory   Safety/Judgement: Decreased awareness of deficits;Decreased awareness of safety   Problem Solving: Slow processing;Requires verbal cues;Difficulty sequencing      General Comments      Exercises        Assessment/Plan    Lee Chang Assessment Patient needs continued Lee Chang services  Lee Chang Diagnosis Difficulty walking;Generalized weakness;Altered mental status   Lee Chang Problem List Decreased strength;Decreased activity tolerance;Decreased balance;Decreased mobility;Decreased cognition;Decreased knowledge of use of DME;Decreased safety awareness;Decreased knowledge of precautions  Lee Chang Treatment Interventions Gait training;Stair  training;DME instruction;Functional mobility training;Therapeutic activities;Therapeutic exercise;Patient/family education   Lee Chang Goals (Current goals can be found in the Care Plan section) Acute Rehab Lee Chang Goals Patient Stated Goal: Lee Chang states that he wants to go back to his home in [place].  Lee Chang Goal Formulation: With patient/family Time For Goal Achievement: 02/15/16 Potential to Achieve Goals: Good    Frequency Min 3X/week   Barriers to discharge        Co-evaluation               End of Session Equipment Utilized During Treatment: Gait belt Activity Tolerance: Patient limited by fatigue;Patient tolerated treatment well Patient left: in chair;with call bell/phone within reach;with family/visitor present Nurse Communication: Mobility status (RN notified of Lee Chang's position)         Time: 6962-9528 Lee Chang Time Calculation (min) (ACUTE ONLY): 30 min   Charges:   Lee Chang Evaluation $Lee Chang Eval Moderate Complexity: 1 Procedure Lee Chang Treatments $Gait Training: 8-22 mins   Lee Chang G Codes:        Beth Levan Aloia, Lee Chang, DPT X: [id number]   02/01/2016, 12:23 PM

## 2016-02-01 NOTE — Progress Notes (Signed)
[facility] PROGRESS NOTE  Lee Chang UVO:[id number] DOB: Nov 30, 1940 DOA: 01/28/2016 PCP: [name], MD Summary 53 yom presented from SNF with complaints of gross hematuria and chest pain. Pt was in the hospital one moth ago, where he suffered non-STEMI and underwent catheterization with 2 DESs placed to the RCA. He developed hematuria at that time after foley cath placement. This resolved prior to discharge. Unfortunately hematuria recurred and he developed significant blood-loss anemia, which resulted in chest pain. Antiplatelet agents held due to bleeding and he was transfused PRBCs. Cardiology and urology have been following. If bleeding persist per urology he may need fulguration. Anticipate he will be in the hospital another few days.  Assessment/Plan: 1. Gross hematuria from prostatic bleeding secondary to antiplatelet therapy. Patient developed hematuria when catheter was placed during cardiac stenting last month which was felt to be related to BPH and trauma from catheter. His urine had cleared prior to discharge, but unfortunately, his hematuria has recurred. At this time, he continues to have evidence of gross hematuria in foley tubing. Dr. Wilson Singer, urology, was consulted and recommends holding antiplatelet therapy and reassessing. If hematuria continues, he may require fulguration. Continue flomax and finsateride. ASA and brilinta are being held. Urine cx revealed coagulase negative staph. Holding pharmacologic VTE ppx. Will perform bladder irrigation with NS PRN obstruction.  2. Acute blood loss anemia. Pt was transfused 2U PRBC on 4/7 and an additional 1 U on 4/10. Hgb appears to improved after transfusion. Continue to monitor closely. 3. AKI. Resolved with IV fluids. Likely prerenal, related to volume depletion. IVF discontinued.  4. UTI. UA indicates possible infection, UC revealed coagulase negative staph. Will continue empiric Rocephin and follow up sensitives. .  5. Lethargy.  Possibly related to dehydration vs possible infection. Continue to monitor. Continues to improve.  6. Chest pain, CAD. Likely due to anemia, has been transfused. ASA / brilinta currently held due to bleed. Pt has a hx of NSTEMI and placement of 2DES to RCA last month. Troponin has remained unremarkable. Ideally, the pt will need to go back on anti-platelet agents, once his hematuria issues have stabilized. Cardiology consulted and recommended possibly changing brilinta to Plavix.  Will need to start anti-platelet agents as soon as possible. 7. COPD. Stable. Continue nebs PRN. Continue O2 given CAD and anemia 8. HTN. Blood pressure is trending up, will restart Coreq. 9. Severe malnutrition in the context of acute illness/injury. Nutrition following.   Code Status: DNR DVT prophylaxis: SCDs Family Communication: Discussed with patient and daughter at bedside Disposition Plan: discharge once improved   Consultants:  Urology  Cardiology   Procedures:  2U PRBC transfusion 4/7  1U PRBC transfusion 4/10  Antibiotics:  Rocephin 4/8 >>  HPI/Subjective: Feeling good today. No reports of chest pain, shortness of breath, n/v/d, or abd pain.  Objective: Filed Vitals:   01/31/16 2100 02/01/16 0500  BP: 142/58 148/58  Pulse: 67 65  Temp: 98.2 F (36.8 C) 98.3 F (36.8 C)  Resp: 16 16    Intake/Output Summary (Last 24 hours) at 02/01/16 0938 Last data filed at 02/01/16 0800  Gross per 24 hour  Intake    785 ml  Output   1800 ml  Net  -1015 ml   Filed Weights   01/28/16 2000 01/29/16 0400 01/30/16 0500  Weight: 81.8 kg (180 lb 5.4 oz) 81.8 kg (180 lb 5.4 oz) 82.8 kg (182 lb 8.7 oz)    Exam:   General: NAD. Appears calm, mild confusion,  Cardiovascular: RRR. No murmur, rubs, or gallops.  Respiratory: clear bilaterally, No wheezing, rales or rhonchi   Abdomen: soft, non tender, no distention , bowel sounds normal. Foley cath in place. Urine in tubing is grossly bloody.    Musculoskeletal: Trace edema b/l  Data Reviewed: Basic Metabolic Panel:  Recent Labs Lab 01/28/16 0710 01/28/16 1535 01/29/16 0658 01/30/16 0431 01/31/16 0607  NA 139 133* 136 138 137  K 4.7 4.5 4.1 3.7 3.5  CL 107 103 110 111 111  CO2 24 23 20* 21* 22  GLUCOSE 116* 124* 93 87 92  BUN 28* 28* 22* 17 9  CREATININE 2.40* 2.19* 1.74* 1.37* 1.04  CALCIUM 8.7* 8.5* 7.8* 7.9* 7.8*    CBC:  Recent Labs Lab 01/28/16 1535 01/29/16 0658 01/29/16 1610 01/30/16 0431 01/30/16 1353 01/31/16 0607 02/01/16 0621  WBC 11.6* 9.1 8.9 12.0* 10.9* 8.3 7.7  NEUTROABS 8.1* 5.9 5.7  --   --   --   --   HGB 7.1* 8.3* 8.7* 8.0* 8.8* 7.6* 8.9*  HCT 20.8* 24.3* 25.3* 24.5* 26.5* 23.1* 25.8*  MCV 88.1 86.8 87.5 89.7 89.8 90.6 87.2  PLT 300 204 200 215 218 202 192   Cardiac Enzymes:  Recent Labs Lab 01/28/16 1535 01/28/16 1930 01/29/16 0658  TROPONINI <0.03 <0.03 <0.03   BNP (last 3 results)  Recent Labs  01/01/16 1840  BNP 36.1     CBG:  Recent Labs Lab 01/29/16 0735 01/30/16 0728 01/31/16 0756 02/01/16 0724  GLUCAP 83 87 85 86    Recent Results (from the past 240 hour(s))  Urine culture     Status: Abnormal (Preliminary result)   Collection Time: 01/28/16  8:50 PM  Result Value Ref Range Status   Specimen Description URINE, CATHETERIZED  Final   Special Requests NONE  Final   Culture (A)  Final    >=100,000 COLONIES/mL STAPHYLOCOCCUS SPECIES (COAGULASE NEGATIVE)   Report Status PENDING  Incomplete  MRSA PCR Screening     Status: None   Collection Time: 01/28/16  9:08 PM  Result Value Ref Range Status   MRSA by PCR NEGATIVE NEGATIVE Final    Comment:        The GeneXpert MRSA Assay (FDA approved for NASAL specimens only), is one component of a comprehensive MRSA colonization surveillance program. It is not intended to diagnose MRSA infection nor to guide or monitor treatment for MRSA infections.      Studies: No results found.  Scheduled Meds: .  atorvastatin  80 mg Oral q1800  . cefTRIAXone (ROCEPHIN)  IV  1 g Intravenous Q24H  . feeding supplement (ENSURE ENLIVE)  237 mL Oral BID BM  . finasteride  5 mg Oral Daily  . sodium chloride flush  3 mL Intravenous Q12H  . tamsulosin  0.8 mg Oral QHS  . tiotropium  1 capsule Inhalation Daily   Continuous Infusions:    Principal Problem:   Acute blood loss anemia Active Problems:   COPD (chronic obstructive pulmonary disease) (HCC)   Essential hypertension   AKI (acute kidney injury) (HCC)   Dementia   Chest pain   Gross hematuria   CAD (coronary artery disease)   Pain in the chest   Protein-calorie malnutrition, severe    Time spent: 25 minutes    Erick BlinksJehanzeb Rhylen Pulido, MD. [facility] Pager [phone]. If 7PM-7AM, please contact night-coverage at [email or web address], password [facility] 02/01/2016, 9:38 AM  LOS: 4 days    By signing my name below, I, Zadie CleverlyJessica Augustus, attest  that this documentation has been prepared under the direction and in the presence of Erick Blinks, MD. Electronically signed: [name], Scribe. 02/01/2016 9:38am   I, Dr. Erick Blinks, personally performed the services described in this documentaiton. All medical record entries made by the scribe were at my direction and in my presence. I have reviewed the chart and agree that the record reflects my personal performance and is accurate and complete  Erick Blinks, MD, 02/01/2016 9:38 AM

## 2016-02-01 NOTE — Progress Notes (Signed)
  Subjective: Patient has no c/o pain  Objective: Vital signs in last 24 hours: Temp:  [98.2 F (36.8 C)-98.7 F (37.1 C)] 98.3 F (36.8 C) (04/11 0500) Pulse Rate:  [65-67] 65 (04/11 0500) Resp:  [15-16] 16 (04/11 0500) BP: (134-178)/(57-96) 148/58 mmHg (04/11 0500) SpO2:  [96 %-99 %] 96 % (04/11 0500)  Intake/Output from previous day: 04/10 0701 - 04/11 0700 In: 3265 [P.O.:440; I.V.:2400; Blood:375; IV Piggyback:50] Out: 1800 [Urine:1800] Intake/Output this shift: Total I/O In: 120 [P.O.:120] Out: -   Physical Exam:  Constitutional: Vital signs reviewed. WD WN in NAD   Eyes: PERRL, No scleral icterus.   Pulmonary/Chest: Normal effort Abdominal: Soft. Non-tender, non-distended, bowel sounds are normal, no masses, organomegaly, or guarding present.   Urine maroon, no clots  Lab Results:  Recent Labs  01/30/16 1353 01/31/16 0607 02/01/16 0621  HGB 8.8* 7.6* 8.9*  HCT 26.5* 23.1* 25.8*   BMET  Recent Labs  01/30/16 0431 01/31/16 0607  NA 138 137  K 3.7 3.5  CL 111 111  CO2 21* 22  GLUCOSE 87 92  BUN 17 9  CREATININE 1.37* 1.04  CALCIUM 7.9* 7.8*   No results for input(s): LABPT, INR in the last 72 hours. No results for input(s): LABURIN in the last 72 hours. Results for orders placed or performed during the hospital encounter of 01/28/16  Urine culture     Status: Abnormal   Collection Time: 01/28/16  8:50 PM  Result Value Ref Range Status   Specimen Description URINE, CATHETERIZED  Final   Special Requests NONE  Final   Culture (A)  Final    >=100,000 COLONIES/mL STAPHYLOCOCCUS SPECIES (COAGULASE NEGATIVE)   Report Status 02/01/2016 FINAL  Final   Organism ID, Bacteria STAPHYLOCOCCUS SPECIES (COAGULASE NEGATIVE) (A)  Final      Susceptibility   Staphylococcus species (coagulase negative) - MIC*    CIPROFLOXACIN >=8 RESISTANT Resistant     GENTAMICIN <=0.5 SENSITIVE Sensitive     NITROFURANTOIN <=16 SENSITIVE Sensitive     OXACILLIN >=4  RESISTANT Resistant     TETRACYCLINE 2 SENSITIVE Sensitive     VANCOMYCIN 2 SENSITIVE Sensitive     TRIMETH/SULFA 80 RESISTANT Resistant     CLINDAMYCIN >=8 RESISTANT Resistant     RIFAMPIN <=0.5 SENSITIVE Sensitive     Inducible Clindamycin NEGATIVE Sensitive     * >=100,000 COLONIES/mL STAPHYLOCOCCUS SPECIES (COAGULASE NEGATIVE)  MRSA PCR Screening     Status: None   Collection Time: 01/28/16  9:08 PM  Result Value Ref Range Status   MRSA by PCR NEGATIVE NEGATIVE Final    Comment:        The GeneXpert MRSA Assay (FDA approved for NASAL specimens only), is one component of a comprehensive MRSA colonization surveillance program. It is not intended to diagnose MRSA infection nor to guide or monitor treatment for MRSA infections.    Bladder irrigated with 800 cc NS--no clots irrigated. Irriigant came out clear. Studies/Results: No results found.  Assessment/Plan:  Gross hematuria, improving off of Brilinta, on abx.  Hopefully with continued irrigation and limited antiplatelet therapy we will cont to see improvement   LOS: 4 days   Marcine MatarDAHLSTEDT, Jadae Steinke M 02/01/2016, 1:08 PM

## 2016-02-02 DIAGNOSIS — R310 Gross hematuria: Secondary | ICD-10-CM

## 2016-02-02 DIAGNOSIS — J438 Other emphysema: Secondary | ICD-10-CM

## 2016-02-02 DIAGNOSIS — E43 Unspecified severe protein-calorie malnutrition: Secondary | ICD-10-CM | POA: Diagnosis not present

## 2016-02-02 DIAGNOSIS — N179 Acute kidney failure, unspecified: Secondary | ICD-10-CM | POA: Diagnosis not present

## 2016-02-02 DIAGNOSIS — D62 Acute posthemorrhagic anemia: Secondary | ICD-10-CM | POA: Diagnosis not present

## 2016-02-02 DIAGNOSIS — I2510 Atherosclerotic heart disease of native coronary artery without angina pectoris: Secondary | ICD-10-CM | POA: Diagnosis not present

## 2016-02-02 LAB — CBC
HEMATOCRIT: 27 % — AB (ref 39.0–52.0)
HEMOGLOBIN: 9.1 g/dL — AB (ref 13.0–17.0)
MCH: 29.7 pg (ref 26.0–34.0)
MCHC: 33.7 g/dL (ref 30.0–36.0)
MCV: 88.2 fL (ref 78.0–100.0)
Platelets: 192 10*3/uL (ref 150–400)
RBC: 3.06 MIL/uL — AB (ref 4.22–5.81)
RDW: 15.4 % (ref 11.5–15.5)
WBC: 8.2 10*3/uL (ref 4.0–10.5)

## 2016-02-02 LAB — BASIC METABOLIC PANEL
ANION GAP: 4 — AB (ref 5–15)
BUN: 9 mg/dL (ref 6–20)
CALCIUM: 8.1 mg/dL — AB (ref 8.9–10.3)
CHLORIDE: 109 mmol/L (ref 101–111)
CO2: 26 mmol/L (ref 22–32)
Creatinine, Ser: 1.05 mg/dL (ref 0.61–1.24)
GFR calc non Af Amer: >60 mL/min (ref >60)
Glucose, Bld: 101 mg/dL — ABNORMAL HIGH (ref 65–99)
POTASSIUM: 3.4 mmol/L — AB (ref 3.5–5.1)
Sodium: 139 mmol/L (ref 135–145)

## 2016-02-02 LAB — GLUCOSE, CAPILLARY: Glucose-Capillary: 93 mg/dL (ref 65–99)

## 2016-02-02 MED ORDER — POTASSIUM CHLORIDE CRYS ER 20 MEQ PO TBCR
40.0000 meq | EXTENDED_RELEASE_TABLET | ORAL | Status: AC
  Administered 2016-02-02 (×2): 40 meq via ORAL
  Filled 2016-02-02 (×2): qty 2

## 2016-02-02 MED ORDER — CLOPIDOGREL BISULFATE 75 MG PO TABS
75.0000 mg | ORAL_TABLET | Freq: Every day | ORAL | Status: DC
  Administered 2016-02-02 – 2016-02-04 (×3): 75 mg via ORAL
  Filled 2016-02-02 (×3): qty 1

## 2016-02-02 NOTE — Progress Notes (Signed)
Hgb remains stable despite still some continued hematuria. At this point we will need to try to restart at least a portion of his antiplatelet therapy as he remains at significant risk of stent thrombosis which could be devastating. We will start plavix 75mg  daily today and continue to follow blood counts and hematuria.    Dominga FerryJ Azlee Monforte MD

## 2016-02-02 NOTE — Progress Notes (Signed)
Physical Therapy Treatment Patient Details Name: Lee Chang MRN: [id number] DOB: August 20, 1941 Today's Date: 02/02/2016    History of Present Illness Lee Chang is a 75 y.o. male with PMH of hypertension, COPD, dementia, and coronary artery disease with 2 drug-eluting stents placed to the RCA 1 month ago and now presenting from his SNF with worsening confusion and chest pain just prior to arrival. Patient had reportedly been quite independent until just over 1 month ago when he suffered a non-STEMI and underwent catheterization with 2 DESs placed to the RCA.  Pt also had gross hematuria, which has since returned.  Head CT is negative for acute intracranial abnormality and chest x-ray demonstrates no acute cardiopulmonary processes. Upon admission hemoglobin was 7.1, but today  H&H is 8.9/30.     PT Comments    Pt received in bed, and was agreeable to PT tx, and anxious to get OOB and to the bathroom today.  Pt demonstrated improved bed level mobility, however HOB was completely raised.  Pt was able to ambulate to the bathroom with Min A, and transfer on/off bathroom commode with Min A.  Pt then ambulated 1x38ft with RW and Min A and required seated recovery period due to fatigue.  Then ambulated 1x71ft with RW and Min A due to fatigue.  Pt requested to attempt having a BM in bathroom again at end, and required Min A for transfer on/off commode.  Pt left sitting up in the chair with alarm donned.  Pt demonstrates progression towards all PT goals at this time, and continued to recommend HHPT with 24/7 assist/supervision.    Follow Up Recommendations  Home health PT     Equipment Recommendations  None recommended by PT    Recommendations for Other Services       Precautions / Restrictions Precautions Precautions: Fall Restrictions Weight Bearing Restrictions: No    Mobility  Bed Mobility Overal bed mobility: Needs Assistance Bed Mobility: Supine to Sit     Supine to sit:  Supervision;HOB elevated     General bed mobility comments: HOB completely raised, and pt was eager to get OOB today.    Transfers Overall transfer level: Needs assistance Equipment used: Rolling walker (2 wheeled)   Sit to Stand: Min guard         General transfer comment: Pt requires vc's for safe hand placement.  Pt assisted on/off bathroom toilet x2 with no success of BM - pt has foley.   Ambulation/Gait Ambulation/Gait assistance: Min assist Ambulation Distance (Feet): 60 Feet (x 2 trials) Assistive device: Rolling walker (2 wheeled) Gait Pattern/deviations: Step-through pattern;Trunk flexed     General Gait Details: Pt ambulated 1 x 67ft, and then seated recovery period due to fatigue, and then 1x80ft.  Pt continues to require Min A for RW negotiation due to stepping outside the base especially during turns and when navigating in tight spaces in the bathroom.  Continued vc's for upright posture due to fwd flexed head and trunk.     Stairs            Wheelchair Mobility    Modified Rankin (Stroke Patients Only)       Balance Overall balance assessment: Needs assistance Sitting-balance support: No upper extremity supported Sitting balance-Leahy Scale: Good     Standing balance support: Bilateral upper extremity supported Standing balance-Leahy Scale: Fair                      Cognition Arousal/Alertness: Awake/alert Behavior  During Therapy: WFL for tasks assessed/performed Overall Cognitive Status: History of cognitive impairments - at baseline Area of Impairment: Memory;Problem solving;Safety/judgement     Memory: Decreased short-term memory   Safety/Judgement: Decreased awareness of deficits;Decreased awareness of safety   Problem Solving: Slow processing;Requires verbal cues;Difficulty sequencing      Exercises      General Comments        Pertinent Vitals/Pain Pain Assessment: No/denies pain    Home Living                       Prior Function            PT Goals (current goals can now be found in the care plan section) Acute Rehab PT Goals Patient Stated Goal: Pt states that he wants to go back to his [place] in [place].  PT Goal Formulation: With patient/family Time For Goal Achievement: 02/15/16 Potential to Achieve Goals: Good Progress towards PT goals: Progressing toward goals    Frequency  Min 3X/week    PT Plan Current plan remains appropriate    Co-evaluation             End of Session Equipment Utilized During Treatment: Gait belt Activity Tolerance: Patient tolerated treatment well Patient left: in chair;with call bell/phone within reach;with chair alarm set     Time: 0822-0900 PT Time Calculation (min) (ACUTE ONLY): 38 min  Charges:  $Gait Training: 23-37 mins $Therapeutic Activity: 8-22 mins                    G Codes:      Beth Aubriegh Minch, PT, DPT X: [id number]  02/02/2016, 9:49 AM

## 2016-02-02 NOTE — Progress Notes (Signed)
PROGRESS NOTE  Lee Chang W Rossman ZOX:[id number] DOB: [birth year] DOA: 01/28/2016 PCP: Selinda FlavinHOWARD, [name], MD  Summary: [age] yom presented from SNF with complaints of recurrent gross hematuria and chest, resulting in blood-loss anemia. He has been admitted for further evaluation and treatment. Since admission he has been transfused 3 units and anti-platelet agents have been held. Cardiology and urology following.   Assessment/Plan: 1. Gross hematuria secondary to prostatic bleeding secondary to antiplatelet therapy. Secondary to trauma from catheter placement prior to admission. Management per urology including irrigation. Initially antiplatelet therapy was discontinued but now has been restarted. Continue Flomax and finasteride. 2. Acute blood loss anemia, secondary to above. Status post total 3 units packed red blood cells. 3. Coronary artery disease with NSTEMI and stent placement last month, on aspirin and Brilinta prior to admission. Medications held but now started on Plavix per cardiology. In regard to NSVT, check magnesium and phosphorus in the morning. Continue Coreg. 4. UTI, treated empirically with antibiotics. 5. Acute kidney injury, resolved with IV fluids. Prerenal. 6. COPD. Stable. 7. Severe malnutrition in the context of acute illness/injury.    Overall appears stable, hemoglobin appears stable, still has some blood in urine. Plavix per cardiology. Await further recommendations from urology.  Replace potassium  Hopefully for discharge next 48 hours.  Code Status: DNR DVT prophylaxis: SCDs Family Communication:  Disposition Plan: home  [name], MD  [facility] Direct contact:  --Via amion app OR  --[email or web address]; password [email or web address] and click  7PM-7AM contact night coverage as above 02/02/2016, 8:40 AM  LOS: 5 days   Consultants:  PT-HHPT  Cardiology  Urology  Procedures:  2U PRBC transfusion 4/7  1U PRBC transfusion 4/10  Antibiotics:  Rocephin 4/8  >>  HPI/Subjective: Feels okay today. No complaints.  Objective: Filed Vitals:   02/01/16 0500 02/01/16 1317 02/01/16 2130 02/02/16 0555  BP: 148/58 143/60 164/53 141/46  Pulse: 65 71 66 59  Temp: 98.3 F (36.8 C) 98.1 F (36.7 C) 97.8 F (36.6 C) 98.4 F (36.9 C)  TempSrc: Oral Oral Oral Oral  Resp: 16 16 20 14   Height:      Weight:      SpO2: 96% 97% 98% 97%    Intake/Output Summary (Last 24 hours) at 02/02/16 0840 Last data filed at 02/02/16 0556  Gross per 24 hour  Intake    240 ml  Output    850 ml  Net   -610 ml     Filed Weights   01/28/16 2000 01/29/16 0400 01/30/16 0500  Weight: 81.8 kg (180 lb 5.4 oz) 81.8 kg (180 lb 5.4 oz) 82.8 kg (182 lb 8.7 oz)    Exam: General:  Appears calm and comfortable Cardiovascular: RRR, no m/r/g. No LE edema. Telemetry: SR, VT x12 beats Respiratory: CTA bilaterally, no w/r/r. Normal respiratory effort. Psychiatric: grossly normal mood and affect, speech fluent and appropriate  New data reviewed:  Hgb 9.1, stable  Potassium 3.4  Scheduled Meds: . atorvastatin  80 mg Oral q1800  . carvedilol  6.25 mg Oral BID WC  . cefTRIAXone (ROCEPHIN)  IV  1 g Intravenous Q24H  . feeding supplement (ENSURE ENLIVE)  237 mL Oral BID BM  . finasteride  5 mg Oral Daily  . sodium chloride flush  3 mL Intravenous Q12H  . tamsulosin  0.8 mg Oral QHS  . tiotropium  1 capsule Inhalation Daily   Continuous Infusions:   Principal Problem:   Acute blood loss anemia Active Problems:   COPD (chronic obstructive  pulmonary disease) (HCC)   Essential hypertension   AKI (acute kidney injury) (HCC)   Dementia   Chest pain   Gross hematuria   CAD (coronary artery disease)   Pain in the chest   Protein-calorie malnutrition, severe   Time spent 20 minutes   By signing my name below, I, [name] attest that this documentation has been prepared under the direction and in the presence of Brendia Sacks, MD Electronically signed:  [name]  02/02/2016   I personally performed the services described in this documentation. All medical record entries made by the scribe were at my direction. I have reviewed the chart and agree that the record reflects my personal performance and is accurate and complete. Brendia Sacks, MD

## 2016-02-03 DIAGNOSIS — I2510 Atherosclerotic heart disease of native coronary artery without angina pectoris: Secondary | ICD-10-CM | POA: Diagnosis not present

## 2016-02-03 DIAGNOSIS — E43 Unspecified severe protein-calorie malnutrition: Secondary | ICD-10-CM | POA: Diagnosis not present

## 2016-02-03 DIAGNOSIS — N179 Acute kidney failure, unspecified: Secondary | ICD-10-CM | POA: Diagnosis not present

## 2016-02-03 DIAGNOSIS — D62 Acute posthemorrhagic anemia: Secondary | ICD-10-CM | POA: Diagnosis not present

## 2016-02-03 DIAGNOSIS — R310 Gross hematuria: Secondary | ICD-10-CM | POA: Diagnosis not present

## 2016-02-03 LAB — BASIC METABOLIC PANEL
ANION GAP: 6 (ref 5–15)
BUN: 10 mg/dL (ref 6–20)
CALCIUM: 8 mg/dL — AB (ref 8.9–10.3)
CO2: 24 mmol/L (ref 22–32)
Chloride: 110 mmol/L (ref 101–111)
Creatinine, Ser: 1.06 mg/dL (ref 0.61–1.24)
GFR calc Af Amer: >60 mL/min (ref >60)
Glucose, Bld: 99 mg/dL (ref 65–99)
POTASSIUM: 4.1 mmol/L (ref 3.5–5.1)
SODIUM: 140 mmol/L (ref 135–145)

## 2016-02-03 LAB — PHOSPHORUS: Phosphorus: 2.4 mg/dL — ABNORMAL LOW (ref 2.5–4.6)

## 2016-02-03 LAB — CBC
HCT: 25.9 % — ABNORMAL LOW (ref 39.0–52.0)
Hemoglobin: 8.7 g/dL — ABNORMAL LOW (ref 13.0–17.0)
MCH: 29.9 pg (ref 26.0–34.0)
MCHC: 33.6 g/dL (ref 30.0–36.0)
MCV: 89 fL (ref 78.0–100.0)
PLATELETS: 205 10*3/uL (ref 150–400)
RBC: 2.91 MIL/uL — AB (ref 4.22–5.81)
RDW: 15.2 % (ref 11.5–15.5)
WBC: 7.9 10*3/uL (ref 4.0–10.5)

## 2016-02-03 LAB — MAGNESIUM: Magnesium: 1.4 mg/dL — ABNORMAL LOW (ref 1.7–2.4)

## 2016-02-03 LAB — GLUCOSE, CAPILLARY: Glucose-Capillary: 90 mg/dL (ref 65–99)

## 2016-02-03 MED ORDER — POTASSIUM PHOSPHATE MONOBASIC 500 MG PO TABS
500.0000 mg | ORAL_TABLET | Freq: Three times a day (TID) | ORAL | Status: DC
  Filled 2016-02-03 (×3): qty 1

## 2016-02-03 MED ORDER — MAGNESIUM OXIDE 400 (241.3 MG) MG PO TABS
400.0000 mg | ORAL_TABLET | Freq: Two times a day (BID) | ORAL | Status: AC
  Administered 2016-02-03 (×2): 400 mg via ORAL
  Filled 2016-02-03 (×2): qty 1

## 2016-02-03 MED ORDER — DOXYCYCLINE HYCLATE 100 MG PO TABS
100.0000 mg | ORAL_TABLET | Freq: Two times a day (BID) | ORAL | Status: DC
  Administered 2016-02-03 – 2016-02-04 (×3): 100 mg via ORAL
  Filled 2016-02-03 (×3): qty 1

## 2016-02-03 MED ORDER — K PHOS MONO-SOD PHOS DI & MONO 155-852-130 MG PO TABS
500.0000 mg | ORAL_TABLET | Freq: Three times a day (TID) | ORAL | Status: AC
  Administered 2016-02-03 (×3): 500 mg via ORAL
  Filled 2016-02-03 (×3): qty 2

## 2016-02-03 NOTE — Care Management (Signed)
Spoke with Daughter Marcello MooresDenise Pruit again concerning discharge plan. Plan is still for patient to return home with care of daughter. She stated that they have all equipment at home. Walker Wheelchair,BSC . Home health referral has been placed with [facility] per choice. Anticipate discharge soon.

## 2016-02-03 NOTE — Progress Notes (Signed)
Restarted plavix yesterday, Hgb overall stable. We will continue to monitor chart and labs from a distance. Would like to resume ASA if Hgb and bleeding continue to stabilize. Would keep K at 4 and Mg at 1.4 in setting of ventricular ectopy.    Dominga FerryJ Dallyn Bergland MD

## 2016-02-03 NOTE — Care Management (Signed)
Discussed case with attending Dr Brendia Sacksaniel Goodrich. Patient started on Plavix yesterday and continues to have hematuria. Attending does not feel medically stable for discharge yet.

## 2016-02-03 NOTE — Progress Notes (Signed)
PROGRESS NOTE  Lee Chang [id number] DOB: 07-08-1941 DOA: 01/28/2016 PCP: Selinda FlavinHOWARD, [name], MD  Summary: [age] yom presented from SNF with complaints of recurrent gross hematuria and chest, resulting in blood-loss anemia. He has been admitted for further evaluation and treatment. Since admission he has been transfused 3 units and anti-platelet agents have been held. Cardiology and urology following.   Assessment/Plan: 1. Gross hematuria secondary to prostatic bleeding secondary to antiplatelet therapy. Secondary to trauma from catheter placement prior to admission. Management per urology including irrigation. Initially antiplatelet therapy was discontinued but now has been restarted. Continue Flomax and finasteride. Still some blood in urine. 2. Acute blood loss anemia, secondary to above. Status post total 3 units pRBC. Remains stable. 3. Coronary artery disease with NSTEMI and stent placement last month, on aspirin and Brilinta prior to admission. Now on Plavix per cardiology. In regard to NSVT, Magnesium 1.4 and phosphorus 2.4 today and there has been no recurrence of dysrhythmia. Continue Coreg. 4. UTI, change antibiotics based on culture data 5. Acute kidney injury, resolved with IV fluids. Prerenal. 6. COPD. Stable. 7. Severe malnutrition in the context of acute illness/injury.     Overall appears stable, hemoglobin appears stable, still has some blood in urine. Plavix per cardiology. Await further recommendations from urology.  Continue abx   Replace magnesium and phosphorus  aspirin may be edited in the near future per cardiology  Code Status: DNR DVT prophylaxis: SCDs Family Communication: discussed with patient Disposition Plan:Possibly home 4/15 and tolerating medications  Brendia Sacksaniel Zayanna Pundt, MD  [facility] Direct contact:  --Via amion app OR  --[email or web address]; password [email or web address] and click  7PM-7AM contact night coverage as above 02/03/2016, 8:05 AM  LOS: 6 days    Consultants:  PT-HHPT  Cardiology  Urology  Procedures:  2U PRBC transfusion 4/7  1U PRBC transfusion 4/10  Antibiotics:  Rocephin 4/8 >> 4/13  Doxycycline 4/8 >>  HPI/Subjective: Feels good today. Slept okay last night. Appetite has been good. No nausea or vomiting. Did not have any BM yesterday. No CP or trouble breathing.  Objective: Filed Vitals:   02/02/16 0555 02/02/16 1400 02/02/16 2028 02/03/16 0630  BP: 141/46 136/80 176/70 160/74  Pulse: 59 64 60 70  Temp: 98.4 F (36.9 C) 98.4 F (36.9 C) 98.5 F (36.9 C) 98.9 F (37.2 C)  TempSrc: Oral  Oral Oral  Resp: 14 18 18 16   Height:      Weight:      SpO2: 97% 96% 100% 98%    Intake/Output Summary (Last 24 hours) at 02/03/16 0805 Last data filed at 02/03/16 0259  Gross per 24 hour  Intake    720 ml  Output   1550 ml  Net   -830 ml     Filed Weights   01/28/16 2000 01/29/16 0400 01/30/16 0500  Weight: 81.8 kg (180 lb 5.4 oz) 81.8 kg (180 lb 5.4 oz) 82.8 kg (182 lb 8.7 oz)    Exam: General:  Appears calm and comfortable Cardiovascular: RRR, no m/r/g. No LE edema.  Telemetry: SR, no arrhythmias  Respiratory: CTA bilaterally, no w/r/r. Normal respiratory effort.  Musculoskeletal: grossly normal tone BUE/BLE Psychiatric: grossly normal mood and affect, speech fluent and appropriate   New data reviewed:  Hgb 8.7, stable  Htc 25.9  Phosphorous 2.4  Mg 1.4  WBC wnl  Calcium 8.0  Glucose wnl.  BMP unremarkable  Scheduled Meds: . atorvastatin  80 mg Oral q1800  . carvedilol  6.25 mg Oral BID WC  .  clopidogrel  75 mg Oral Daily  . doxycycline  100 mg Oral Q12H  . feeding supplement (ENSURE ENLIVE)  237 mL Oral BID BM  . finasteride  5 mg Oral Daily  . magnesium oxide  400 mg Oral BID  . potassium phosphate (monobasic)  500 mg Oral TID WC & HS  . sodium chloride flush  3 mL Intravenous Q12H  . tamsulosin  0.8 mg Oral QHS  . tiotropium  1 capsule Inhalation Daily   Continuous  Infusions:   Principal Problem:   Acute blood loss anemia Active Problems:   COPD (chronic obstructive pulmonary disease) (HCC)   Essential hypertension   AKI (acute kidney injury) (HCC)   Dementia   Chest pain   Gross hematuria   CAD (coronary artery disease)   Pain in the chest   Protein-calorie malnutrition, severe   Time spent 25 minutes   By signing my name below, I, Adron Bene, attest that this documentation has been prepared under the direction and in the presence of Delaynie Stetzer P. Irene Limbo, MD. Electronically Signed: Adron Bene, Scribe.  02/03/2016 10:30am   I personally performed the services described in this documentation. All medical record entries made by the scribe were at my direction. I have reviewed the chart and agree that the record reflects my personal performance and is accurate and complete. Brendia Sacks, MD

## 2016-02-04 DIAGNOSIS — I2510 Atherosclerotic heart disease of native coronary artery without angina pectoris: Secondary | ICD-10-CM | POA: Diagnosis not present

## 2016-02-04 DIAGNOSIS — R310 Gross hematuria: Secondary | ICD-10-CM | POA: Diagnosis not present

## 2016-02-04 DIAGNOSIS — E43 Unspecified severe protein-calorie malnutrition: Secondary | ICD-10-CM | POA: Diagnosis not present

## 2016-02-04 DIAGNOSIS — N179 Acute kidney failure, unspecified: Secondary | ICD-10-CM | POA: Diagnosis not present

## 2016-02-04 DIAGNOSIS — D62 Acute posthemorrhagic anemia: Secondary | ICD-10-CM | POA: Diagnosis not present

## 2016-02-04 LAB — CBC
HEMATOCRIT: 29 % — AB (ref 39.0–52.0)
HEMOGLOBIN: 9.6 g/dL — AB (ref 13.0–17.0)
MCH: 29.6 pg (ref 26.0–34.0)
MCHC: 33.1 g/dL (ref 30.0–36.0)
MCV: 89.5 fL (ref 78.0–100.0)
Platelets: 221 10*3/uL (ref 150–400)
RBC: 3.24 MIL/uL — ABNORMAL LOW (ref 4.22–5.81)
RDW: 15.1 % (ref 11.5–15.5)
WBC: 9 10*3/uL (ref 4.0–10.5)

## 2016-02-04 LAB — GLUCOSE, CAPILLARY: Glucose-Capillary: 76 mg/dL (ref 65–99)

## 2016-02-04 MED ORDER — CLOPIDOGREL BISULFATE 75 MG PO TABS: 75.0000 mg | ORAL_TABLET | Freq: Every day | ORAL | Status: AC

## 2016-02-04 MED ORDER — DOXYCYCLINE HYCLATE 100 MG PO TABS: 100.0000 mg | ORAL_TABLET | Freq: Two times a day (BID) | ORAL | Status: DC

## 2016-02-04 MED ORDER — ASPIRIN EC 81 MG PO TBEC
81.0000 mg | DELAYED_RELEASE_TABLET | Freq: Every day | ORAL | Status: DC
  Administered 2016-02-04: 81 mg via ORAL
  Filled 2016-02-04: qty 1

## 2016-02-04 NOTE — Progress Notes (Signed)
Hgb remains stable, uptrend today. He remains on plavix, we will restart ASA 81mg  today. Once again the risks of hematuria and anemia must be weighed against the risks of acute stent thrombosis and MI. Now that Hgb levels have stabilized will reiinitiate ASA as well. If recurrent drop in Hgb if neccesary would stop ASA and continue plavix alone. We will sign off inpatient care, may call with questions.    Dominga FerryJ Raahil Ong MD

## 2016-02-04 NOTE — Progress Notes (Signed)
PROGRESS NOTE  Lee Chang UEA:[id number] DOB: 09-Apr-1941 DOA: 01/28/2016 PCP: [name], MD Dr. Vernie Ammons   Summary: 63 yom presented from SNF with complaints of recurrent gross hematuria and chest, resulting in blood-loss anemia. He has been admitted for further evaluation and treatment. Since admission he has been transfused 3 units and anti-platelet agents have been held. Cardiology and urology following.   Assessment/Plan: 1. Gross hematuria secondary to prostatic bleeding secondary to antiplatelet therapy with Brilinta and aspirin. Seen by urology who recommended conservative management and withholding of antiplatelet agents. Did require blood transfusion but bleeding has stopped and hemoglobin remained stable. Now on Plavix and aspirin per cardiology. 2. Acute blood loss anemia secondary to gross hematuria. Resolved. Hemoglobin stable. Status post 3 units act red blood cells. 3. Acute kidney injury secondary to ATN secondary to bleeding. Resolved. 4. Coronary artery disease with NSTEMI and stent placement last month, on aspirin and Brilinta prior to admission. Now on Plavix and aspirin per cardiology.  5. BPH 6. UTI, continue doxycycline 7. COPD. remained stable 8. Severe malnutrition in the context of acute illness/injury.    Doing well, urine has cleared, hemoglobin has increased, tolerating Plavix. Aspirin restarted. Urology clinic is closest day, will arrange follow-up next week.  PT recommends home health  Will discuss with daughter, anticipate discharge next 24 hours.  Code Status: DNR DVT prophylaxis: SCDs Family Communication:  Disposition Plan:   Brendia Sacks, MD  [facility] Direct contact:  --Via amion app OR  --[email or web address]; password [email or web address] and click  7PM-7AM contact night coverage as above 02/04/2016, 8:27 AM  LOS: 7 days   Consultants:  PT-HHPT  Cardiology  Urology  Procedures:  2U PRBC transfusion 4/7  1U PRBC transfusion  4/10  Antibiotics:  Rocephin 4/8 >> 4/13  Doxycycline 4/13 >> 4/17  HPI/Subjective: Denies any overnight problems. Is eating fine. Denies any CP or trouble breathing. No bleeding.  Objective: Filed Vitals:   02/03/16 1346 02/03/16 2117 02/04/16 0630 02/04/16 0739  BP: 127/57 161/63 157/64   Pulse: 66 60 57   Temp: 98.4 F (36.9 C) 98.5 F (36.9 C) 98.2 F (36.8 C)   TempSrc:  Oral Oral   Resp: Height:      Weight:   75.841 kg (167 lb 3.2 oz)   SpO2: 98% 99% 98% 94%    Intake/Output Summary (Last 24 hours) at 02/04/16 0827 Last data filed at 02/04/16 0615  Gross per 24 hour  Intake    600 ml  Output   1950 ml  Net  -1350 ml     Filed Weights   01/29/16 0400 01/30/16 0500 02/04/16 0630  Weight: 81.8 kg (180 lb 5.4 oz) 82.8 kg (182 lb 8.7 oz) 75.841 kg (167 lb 3.2 oz)    Exam:  General:  Appears comfortable, calm. Cardiovascular: Regular rate and rhythm, no murmur, rub or gallop.  Respiratory: Clear to auscultation bilaterally, no wheezes, rales or rhonchi. Normal respiratory effort. Psychiatric: grossly normal mood and affect, speech fluent and appropriate Urine in foley bag is clear yellow, no blood  New data reviewed:  Hgb 9.6, stable/improved  WBC wnl  CBG wnl.  Scheduled Meds: . atorvastatin  80 mg Oral q1800  . carvedilol  6.25 mg Oral BID WC  . clopidogrel  75 mg Oral Daily  . doxycycline  100 mg Oral Q12H  . feeding supplement (ENSURE ENLIVE)  237 mL Oral BID BM  . finasteride  5 mg Oral Daily  .  sodium chloride flush  3 mL Intravenous Q12H  . tamsulosin  0.8 mg Oral QHS  . tiotropium  1 capsule Inhalation Daily   Continuous Infusions:   Principal Problem:   Acute blood loss anemia Active Problems:   COPD (chronic obstructive pulmonary disease) (HCC)   Essential hypertension   AKI (acute kidney injury) (HCC)   Dementia   Chest pain   Gross hematuria   CAD (coronary artery disease)   Pain in the chest   Protein-calorie  malnutrition, severe      By signing my name below, I, Adron BeneGreylon Gawaluck, attest that this documentation has been prepared under the direction and in the presence of Lee Rodkey P. Irene LimboGoodrich, MD. Electronically Signed: Adron BeneGreylon Gawaluck, Scribe.  02/04/2016 11:35am  I personally performed the services described in this documentation. All medical record entries made by the scribe were at my direction. I have reviewed the chart and agree that the record reflects my personal performance and is accurate and complete. Brendia Sacksaniel Shterna Laramee, MD

## 2016-02-04 NOTE — Discharge Summary (Signed)
Physician Discharge Summary  Lee Chang Janice Norrie UJW:[id number] DOB: 08/02/1941 DOA: 01/28/2016  PCP: [name], MD  Admit date: 01/28/2016 Discharge date: 02/04/2016  Recommendations for Outpatient Follow-up:  1. Gross hematuria secondary to prostatic bleeding complicated by antiplatelet therapy for recent stent placement 2. Changed from Brilinta to Plavix secondary to above. Continued on aspirin.   Follow-up Information    Follow up with [name], MD.   Specialty:  Family Medicine   Why:  As needed   Contact information:   [address] [place] [address] [phone]       Follow up with Garnett Farm, MD. Schedule an appointment as soon as possible for a visit in 1 week.   Specialty:  Urology   Contact information:   [address] [place] [address] [phone]      Discharge Diagnoses:  1. Gross hematuria secondary to prostatic bleeding secondary to antiplatelet therapy 2. Acute blood loss anemia 3. Acute kidney injury 4. Coronary artery disease 5. UTI 6. Severe malnutrition in context of acute illness/injury  Discharge Condition: Improved Disposition: discharge back to SNF  Diet recommendation: Heart healthy  Filed Weights   01/29/16 0400 01/30/16 0500 02/04/16 0630  Weight: 81.8 kg (180 lb 5.4 oz) 82.8 kg (182 lb 8.7 oz) 75.841 kg (167 lb 3.2 oz)    History of present illness:  75 yom presented from SNF with recurrent gross hematuria and chest pain.  Hospital Course:  Patient required urology consultation for assistance with gross hematuria and clots. Foley catheter in place with irrigation as needed. Both Brilinta and aspirin were discontinued secondary to significant blood loss and the patient was transfused a total of 3 units packed red blood cells. Bleeding stopped, hemoglobin stabilized and in consultation with cardiology, Plavix was substituted for Brilinta and then aspirin was added. Currently stable with no evidence of bleeding. Discussed in detail  with daughter at bedside, discussed timing of reinitiation of medications and the possibility of rebleeding which may necessitate further hospitalization. However at this point, she like to take him home and I think this is reasonable. She knows to return should the Foley catheter clot, stopped draining urine or have significant bleeding. Patient will follow up with urology as an outpatient.   Gross hematuria secondary to prostatic bleeding secondary to antiplatelet therapy with Brilinta and aspirin. Seen by urology who recommended conservative management and withholding of antiplatelet agents. Did require blood transfusion but bleeding has stopped and hemoglobin remained stable. Now on Plavix and aspirin per cardiology.  Acute blood loss anemia secondary to gross hematuria. Resolved. Hemoglobin stable. Status post 3 units act red blood cells.  Acute kidney injury secondary to ATN secondary to bleeding. Resolved.  Coronary artery disease with NSTEMI and stent placement last month, on aspirin and Brilinta prior to admission. Now on Plavix and aspirin per cardiology.   BPH  UTI, continue doxycycline  COPD. remained stable  Severe malnutrition in the context of acute illness/injury.  Consultants:  PT-HHPT  Cardiology  Urology  Procedures:  2U PRBC transfusion 4/7  1U PRBC transfusion 4/10  Antibiotics:  Rocephin 4/8 >> 4/13  Doxycycline 4/13 >> 4/17  Discharge Instructions  Discharge Instructions    Diet - low sodium heart healthy    Complete by:  As directed      Discharge instructions    Complete by:  As directed   Call your physician or seek immediate medical attention for bleeding, blocked catheter, pain, weakness, passing out, chest pain, shortness  of breath or worsening of condition.     Increase activity slowly    Complete by:  As directed           Discharge Medication List as of 02/04/2016  1:55 PM    START taking these medications   Details  clopidogrel  (PLAVIX) 75 MG tablet Take 1 tablet (75 mg total) by mouth daily., Starting 02/04/2016, Until Discontinued, Normal    doxycycline (VIBRA-TABS) 100 MG tablet Take 1 tablet (100 mg total) by mouth every 12 (twelve) hours., Starting 02/04/2016, Until Discontinued, Normal      CONTINUE these medications which have NOT CHANGED   Details  aspirin EC 81 MG EC tablet Take 1 tablet (81 mg total) by mouth daily., Starting 01/11/2016, Until Discontinued, Print    atorvastatin (LIPITOR) 80 MG tablet Take 1 tablet (80 mg total) by mouth daily at 6 PM., Starting 01/11/2016, Until Discontinued, Print    carvedilol (COREG) 12.5 MG tablet Take 1 tablet (12.5 mg total) by mouth 2 (two) times daily with a meal., Starting 01/11/2016, Until Discontinued, Print    finasteride (PROSCAR) 5 MG tablet Take 5 mg by mouth daily. , Starting 06/09/2014, Until Discontinued, Historical Med    Fish Oil-Cholecalciferol (FISH OIL + D3 PO) Take 1 capsule by mouth at bedtime., Until Discontinued, Historical Med    losartan (COZAAR) 100 MG tablet Take 1 tablet (100 mg total) by mouth daily., Starting 01/11/2016, Until Discontinued, Print    potassium chloride (K-DUR) 10 MEQ tablet Take 10 mEq by mouth daily., Until Discontinued, Historical Med    SPIRIVA HANDIHALER 18 MCG inhalation capsule Place 1 capsule into inhaler and inhale daily., Starting 12/03/2015, Until Discontinued, Historical Med    tamsulosin (FLOMAX) 0.4 MG CAPS capsule Take 0.8 mg by mouth at bedtime. , Starting 06/09/2014, Until Discontinued, Historical Med    acetaminophen (TYLENOL) 325 MG tablet Take 650 mg by mouth every 6 (six) hours as needed for moderate pain., Until Discontinued, Historical Med    albuterol (PROVENTIL) (2.5 MG/3ML) 0.083% nebulizer solution Take 3 mLs (2.5 mg total) by nebulization every 2 (two) hours as needed for wheezing., Starting 01/11/2016, Until Discontinued, Print    alum & mag hydroxide-simeth (MAALOX/MYLANTA) 200-200-20 MG/5ML  suspension Take 30 mLs by mouth every 6 (six) hours as needed for indigestion or heartburn., Starting 01/11/2016, Until Discontinued, Print    nitroGLYCERIN (NITROSTAT) 0.4 MG SL tablet Place 1 tablet (0.4 mg total) under the tongue every 5 (five) minutes as needed for chest pain., Starting 01/11/2016, Until Discontinued, Print    VENTOLIN HFA 108 (90 BASE) MCG/ACT inhaler Inhale 2 puffs into the lungs every 6 (six) hours as needed for wheezing or shortness of breath. , Starting 06/30/2014, Until Discontinued, Historical Med      STOP taking these medications     ticagrelor (BRILINTA) 90 MG TABS tablet        Allergies  Allergen Reactions  . Asa [Aspirin]     Bleeding ulcer prior to 2007    The results of significant diagnostics from this hospitalization (including imaging, microbiology, ancillary and laboratory) are listed below for reference.    Significant Diagnostic Studies: Ct Head Wo Contrast  01/28/2016  CLINICAL DATA:  Worsening dementia x 1 day.  Hx of HTN,CAD,COPD EXAM: CT HEAD WITHOUT CONTRAST TECHNIQUE: Contiguous axial images were obtained from the base of the skull through the vertex without intravenous contrast. COMPARISON:  01/10/2016 Switch FINDINGS: The ventricles are normal in size, for the patient's age, and normal  in configuration. There are no parenchymal masses or mass effect. There is no evidence of a cortical infarct. Not mild periventricular white matter hypoattenuation is noted consistent chronic microvascular ischemic change. There are no extra-axial masses or abnormal fluid collections. There is no intracranial hemorrhage. The visualized sinuses and mastoid air cells are clear. No skull lesion. Stable appearance from the prior head CT. IMPRESSION: 1. No acute intracranial abnormalities. 2. Age-appropriate volume loss. Mild chronic microvascular ischemic change. Electronically Signed   By: Amie Portlandavid  Ormond M.D.   On: 01/28/2016 16:37   Dg Chest Portable 1 View  01/28/2016   CLINICAL DATA:  75 year old male with increased chest pain, blood pressure running low. Initial encounter. EXAM: PORTABLE CHEST 1 VIEW COMPARISON:  01/05/2016 and earlier. FINDINGS: Portable AP upright view at 1542 hours. Stable lung volumes. Stable mild cardiomegaly. Allowing for portable technique, the lungs are clear. No pneumothorax. IMPRESSION: No acute cardiopulmonary abnormality. Electronically Signed   By: Odessa FlemingH  Hall M.D.   On: 01/28/2016 15:57   Microbiology: Recent Results (from the past 240 hour(s))  Urine culture     Status: Abnormal   Collection Time: 01/28/16  8:50 PM  Result Value Ref Range Status   Specimen Description URINE, CATHETERIZED  Final   Special Requests NONE  Final   Culture (A)  Final    >=100,000 COLONIES/mL STAPHYLOCOCCUS SPECIES (COAGULASE NEGATIVE)   Report Status 02/01/2016 FINAL  Final   Organism ID, Bacteria STAPHYLOCOCCUS SPECIES (COAGULASE NEGATIVE) (A)  Final      Susceptibility   Staphylococcus species (coagulase negative) - MIC*    CIPROFLOXACIN >=8 RESISTANT Resistant     GENTAMICIN <=0.5 SENSITIVE Sensitive     NITROFURANTOIN <=16 SENSITIVE Sensitive     OXACILLIN >=4 RESISTANT Resistant     TETRACYCLINE 2 SENSITIVE Sensitive     VANCOMYCIN 2 SENSITIVE Sensitive     TRIMETH/SULFA 80 RESISTANT Resistant     CLINDAMYCIN >=8 RESISTANT Resistant     RIFAMPIN <=0.5 SENSITIVE Sensitive     Inducible Clindamycin NEGATIVE Sensitive     * >=100,000 COLONIES/mL STAPHYLOCOCCUS SPECIES (COAGULASE NEGATIVE)  MRSA PCR Screening     Status: None   Collection Time: 01/28/16  9:08 PM  Result Value Ref Range Status   MRSA by PCR NEGATIVE NEGATIVE Final    Comment:        The GeneXpert MRSA Assay (FDA approved for NASAL specimens only), is one component of a comprehensive MRSA colonization surveillance program. It is not intended to diagnose MRSA infection nor to guide or monitor treatment for MRSA infections.      Labs: Basic Metabolic  Panel:  Recent Labs Lab 01/29/16 0658 01/30/16 0431 01/31/16 0607 02/02/16 0609 02/03/16 0614  NA 136 138 137 139 140  K 4.1 3.7 3.5 3.4* 4.1  CL 110 111 111 109 110  CO2 20* 21* 22 26 24   GLUCOSE 93 87 92 101* 99  BUN 22* 17 9 9 10   CREATININE 1.74* 1.37* 1.04 1.05 1.06  CALCIUM 7.8* 7.9* 7.8* 8.1* 8.0*  MG  --   --   --   --  1.4*  PHOS  --   --   --   --  2.4*   CBC:  Recent Labs Lab 01/28/16 1535 01/29/16 0658 01/29/16 1610  01/31/16 0607 02/01/16 0621 02/02/16 0609 02/03/16 0644 02/04/16 0638  WBC 11.6* 9.1 8.9  < > 8.3 7.7 8.2 7.9 9.0  NEUTROABS 8.1* 5.9 5.7  --   --   --   --   --   --  HGB 7.1* 8.3* 8.7*  < > 7.6* 8.9* 9.1* 8.7* 9.6*  HCT 20.8* 24.3* 25.3*  < > 23.1* 25.8* 27.0* 25.9* 29.0*  MCV 88.1 86.8 87.5  < > 90.6 87.2 88.2 89.0 89.5  PLT 300 204 200  < > 202 192 192 205 221  < > = values in this interval not displayed. Cardiac Enzymes:  Recent Labs Lab 01/28/16 1535 01/28/16 1930 01/29/16 0658  TROPONINI <0.03 <0.03 <0.03   CBG:  Recent Labs Lab 01/31/16 0756 02/01/16 0724 02/02/16 0738 02/03/16 0736 02/04/16 0812  GLUCAP 85 86 93 90 76    Principal Problem:   Acute blood loss anemia Active Problems:   COPD (chronic obstructive pulmonary disease) (HCC)   Essential hypertension   AKI (acute kidney injury) (HCC)   Dementia   Chest pain   Gross hematuria   CAD (coronary artery disease)   Pain in the chest   Protein-calorie malnutrition, severe   Time coordinating discharge: 35 minutes  Signed:  Brendia Sacks, MD [facility] 02/04/2016, 3:34 PM  By signing my name below, I, Adron Bene, attest that this documentation has been prepared under the direction and in the presence of Jaz Mallick P. Irene Limbo, MD. Electronically Signed: Adron Bene, Scribe.  02/04/2016 11:35am  I personally performed the services described in this documentation. All medical record entries made by the scribe were at my direction. I  have reviewed the chart and agree that the record reflects my personal performance and is accurate and complete. Brendia Sacks, MD

## 2016-02-04 NOTE — Progress Notes (Signed)
Physical Therapy Treatment Patient Details Name: Lee Chang MRN: [id number] DOB: 1941-04-16 Today's Date: 02/04/2016    History of Present Illness Lee Chang is a 75 y.o. male with PMH of hypertension, COPD, dementia, and coronary artery disease with 2 drug-eluting stents placed to the RCA 1 month ago and now presenting from his SNF with worsening confusion and chest pain just prior to arrival. Patient had reportedly been quite independent until just over 1 month ago when he suffered a non-STEMI and underwent catheterization with 2 DESs placed to the RCA.  Pt also had gross hematuria, which has since returned.  Head CT is negative for acute intracranial abnormality and chest x-ray demonstrates no acute cardiopulmonary processes. Upon admission hemoglobin was 7.1, but today  H&H is 8.9/30.     PT Comments    Pt received sitting up in the chair, and was agreeable to PT tx. Pt's brother arrived during tx.  Pt required varying levels of assistance for sit<>stand transfer today from CGA> Min A.  Pt ambulated 2x248ft with RW and CGA but progressed to supervision.  Pt had 1 episode during gait where he thought he saw a rice tower down the hallway, but this was likely the houskeeping cart, and he also stated that he had spilled maple syrup all over the floor and was stuck in it, however no syrup noted on the floor.  Continued to recommend HHPT with 24/7 assistance/supervisin at this time.    Follow Up Recommendations  Home health PT     Equipment Recommendations  None recommended by PT    Recommendations for Other Services       Precautions / Restrictions Precautions Precautions: Fall Restrictions Weight Bearing Restrictions: No    Mobility  Bed Mobility                  Transfers Overall transfer level: Needs assistance Equipment used: Rolling walker (2 wheeled) Transfers: Sit to/from Stand Sit to Stand: Min assist;Min guard         General transfer comment:  Assistance level varies.  Pt initally only required CGA, however during the last transfer, he required increased cues and assistance to perform transfer.   Ambulation/Gait Ambulation/Gait assistance: Min guard;Supervision Ambulation Distance (Feet): 200 Feet (x2 trials) Assistive device: Rolling walker (2 wheeled) Gait Pattern/deviations: Step-through pattern;Trunk flexed     General Gait Details: Pt continues to demonstrate fwd flexed posture.  Able to correct with vc's, but does not sustain.  Pt ambulated 2 x 258ft with RW and CGA but progressed to supervision.  RW height adjusted and improved posture noted.  At the end of the tx, pt ambulated into the bathroom, but unsuccessful.    Stairs            Wheelchair Mobility    Modified Rankin (Stroke Patients Only)       Balance           Standing balance support: Bilateral upper extremity supported Standing balance-Leahy Scale: Fair                      Cognition Arousal/Alertness: Awake/alert Behavior During Therapy: WFL for tasks assessed/performed Overall Cognitive Status: History of cognitive impairments - at baseline Area of Impairment: Orientation;Memory Orientation Level: Person;Place Current Attention Level: Selective Memory: Decreased recall of precautions;Decreased short-term memory   Safety/Judgement: Decreased awareness of safety;Decreased awareness of deficits   Problem Solving: Slow processing;Requires verbal cues;Difficulty sequencing General Comments: Possible visual hallucination - during gait he  stated that he saw a rice tower down the hall - however there was a housekeeping cart in the hallway.  He also stated that he had rolled through all the maple syrup, and that it was covering the floor and he was stuck in it.  However, no maple syrup on the floor.     Exercises      General Comments        Pertinent Vitals/Pain Pain Assessment: No/denies pain    Home Living                       Prior Function            PT Goals (current goals can now be found in the care plan section) Acute Rehab PT Goals Patient Stated Goal: Pt states that he wants to go back to his home in [place].  PT Goal Formulation: With patient/family Time For Goal Achievement: 02/15/16 Potential to Achieve Goals: Good Progress towards PT goals: Progressing toward goals    Frequency  Min 3X/week    PT Plan Current plan remains appropriate    Co-evaluation             End of Session Equipment Utilized During Treatment: Gait belt Activity Tolerance: Patient tolerated treatment well Patient left: in chair;with call bell/phone within reach;with chair alarm set;with family/visitor present (Pt's brother present. )     Time: [id number] PT Time Calculation (min) (ACUTE ONLY): 34 min  Charges:  $Gait Training: 23-37 mins                    G Codes:      Beth Chancy Smigiel, PT, DPT X: [phone]   02/04/2016, 11:17 AM

## 2016-02-04 NOTE — Progress Notes (Signed)
Patient with orders to be discharge home. Discharge instructions and catheter education given, patient's daughter verbalized understanding. Patient stable. Patient left in private vehicle with daughter.

## 2016-02-06 DIAGNOSIS — J449 Chronic obstructive pulmonary disease, unspecified: Secondary | ICD-10-CM | POA: Diagnosis not present

## 2016-02-06 DIAGNOSIS — E785 Hyperlipidemia, unspecified: Secondary | ICD-10-CM | POA: Diagnosis not present

## 2016-02-06 DIAGNOSIS — D62 Acute posthemorrhagic anemia: Secondary | ICD-10-CM | POA: Diagnosis not present

## 2016-02-06 DIAGNOSIS — Z436 Encounter for attention to other artificial openings of urinary tract: Secondary | ICD-10-CM | POA: Diagnosis not present

## 2016-02-06 DIAGNOSIS — R310 Gross hematuria: Secondary | ICD-10-CM | POA: Diagnosis not present

## 2016-02-06 DIAGNOSIS — I2510 Atherosclerotic heart disease of native coronary artery without angina pectoris: Secondary | ICD-10-CM | POA: Diagnosis not present

## 2016-02-06 DIAGNOSIS — L89151 Pressure ulcer of sacral region, stage 1: Secondary | ICD-10-CM | POA: Diagnosis not present

## 2016-02-06 DIAGNOSIS — I10 Essential (primary) hypertension: Secondary | ICD-10-CM | POA: Diagnosis not present

## 2016-02-06 DIAGNOSIS — N401 Enlarged prostate with lower urinary tract symptoms: Secondary | ICD-10-CM | POA: Diagnosis not present

## 2016-02-09 DIAGNOSIS — N401 Enlarged prostate with lower urinary tract symptoms: Secondary | ICD-10-CM | POA: Diagnosis not present

## 2016-02-09 DIAGNOSIS — R338 Other retention of urine: Secondary | ICD-10-CM | POA: Diagnosis not present

## 2016-02-09 DIAGNOSIS — R310 Gross hematuria: Secondary | ICD-10-CM | POA: Diagnosis not present

## 2016-02-09 DIAGNOSIS — N138 Other obstructive and reflux uropathy: Secondary | ICD-10-CM | POA: Diagnosis not present

## 2016-02-10 DIAGNOSIS — N401 Enlarged prostate with lower urinary tract symptoms: Secondary | ICD-10-CM | POA: Diagnosis not present

## 2016-02-10 DIAGNOSIS — D62 Acute posthemorrhagic anemia: Secondary | ICD-10-CM | POA: Diagnosis not present

## 2016-02-10 DIAGNOSIS — Z436 Encounter for attention to other artificial openings of urinary tract: Secondary | ICD-10-CM | POA: Diagnosis not present

## 2016-02-10 DIAGNOSIS — L89151 Pressure ulcer of sacral region, stage 1: Secondary | ICD-10-CM | POA: Diagnosis not present

## 2016-02-10 DIAGNOSIS — J449 Chronic obstructive pulmonary disease, unspecified: Secondary | ICD-10-CM | POA: Diagnosis not present

## 2016-02-10 DIAGNOSIS — I2510 Atherosclerotic heart disease of native coronary artery without angina pectoris: Secondary | ICD-10-CM | POA: Diagnosis not present

## 2016-02-10 DIAGNOSIS — I10 Essential (primary) hypertension: Secondary | ICD-10-CM | POA: Diagnosis not present

## 2016-02-10 DIAGNOSIS — R310 Gross hematuria: Secondary | ICD-10-CM | POA: Diagnosis not present

## 2016-02-10 DIAGNOSIS — E785 Hyperlipidemia, unspecified: Secondary | ICD-10-CM | POA: Diagnosis not present

## 2016-02-11 DIAGNOSIS — I2510 Atherosclerotic heart disease of native coronary artery without angina pectoris: Secondary | ICD-10-CM | POA: Diagnosis not present

## 2016-02-11 DIAGNOSIS — E785 Hyperlipidemia, unspecified: Secondary | ICD-10-CM | POA: Diagnosis not present

## 2016-02-11 DIAGNOSIS — Z436 Encounter for attention to other artificial openings of urinary tract: Secondary | ICD-10-CM | POA: Diagnosis not present

## 2016-02-11 DIAGNOSIS — I10 Essential (primary) hypertension: Secondary | ICD-10-CM | POA: Diagnosis not present

## 2016-02-11 DIAGNOSIS — D62 Acute posthemorrhagic anemia: Secondary | ICD-10-CM | POA: Diagnosis not present

## 2016-02-11 DIAGNOSIS — N401 Enlarged prostate with lower urinary tract symptoms: Secondary | ICD-10-CM | POA: Diagnosis not present

## 2016-02-11 DIAGNOSIS — R310 Gross hematuria: Secondary | ICD-10-CM | POA: Diagnosis not present

## 2016-02-11 DIAGNOSIS — L89151 Pressure ulcer of sacral region, stage 1: Secondary | ICD-10-CM | POA: Diagnosis not present

## 2016-02-11 DIAGNOSIS — J449 Chronic obstructive pulmonary disease, unspecified: Secondary | ICD-10-CM | POA: Diagnosis not present

## 2016-02-14 ENCOUNTER — Emergency Department (HOSPITAL_COMMUNITY)
Admission: EM | Admit: 2016-02-14 | Discharge: 2016-02-14 | Disposition: A | Discharge status: Home / Self Care | Payer: Medicare Other | Attending: Emergency Medicine | Admitting: Emergency Medicine

## 2016-02-14 ENCOUNTER — Encounter (HOSPITAL_COMMUNITY): Payer: Self-pay

## 2016-02-14 ENCOUNTER — Emergency Department (HOSPITAL_COMMUNITY): Payer: Medicare Other

## 2016-02-14 DIAGNOSIS — R609 Edema, unspecified: Secondary | ICD-10-CM

## 2016-02-14 DIAGNOSIS — J45909 Unspecified asthma, uncomplicated: Secondary | ICD-10-CM | POA: Diagnosis not present

## 2016-02-14 DIAGNOSIS — I252 Old myocardial infarction: Secondary | ICD-10-CM | POA: Diagnosis not present

## 2016-02-14 DIAGNOSIS — M7989 Other specified soft tissue disorders: Secondary | ICD-10-CM | POA: Diagnosis not present

## 2016-02-14 DIAGNOSIS — E785 Hyperlipidemia, unspecified: Secondary | ICD-10-CM | POA: Diagnosis not present

## 2016-02-14 DIAGNOSIS — R600 Localized edema: Secondary | ICD-10-CM | POA: Diagnosis not present

## 2016-02-14 DIAGNOSIS — J441 Chronic obstructive pulmonary disease with (acute) exacerbation: Secondary | ICD-10-CM | POA: Insufficient documentation

## 2016-02-14 DIAGNOSIS — Z79899 Other long term (current) drug therapy: Secondary | ICD-10-CM | POA: Insufficient documentation

## 2016-02-14 DIAGNOSIS — R0602 Shortness of breath: Secondary | ICD-10-CM | POA: Diagnosis not present

## 2016-02-14 DIAGNOSIS — Z7982 Long term (current) use of aspirin: Secondary | ICD-10-CM | POA: Insufficient documentation

## 2016-02-14 DIAGNOSIS — I25110 Atherosclerotic heart disease of native coronary artery with unstable angina pectoris: Secondary | ICD-10-CM | POA: Insufficient documentation

## 2016-02-14 DIAGNOSIS — Z87891 Personal history of nicotine dependence: Secondary | ICD-10-CM | POA: Insufficient documentation

## 2016-02-14 DIAGNOSIS — I10 Essential (primary) hypertension: Secondary | ICD-10-CM | POA: Diagnosis not present

## 2016-02-14 LAB — CBC WITH DIFFERENTIAL/PLATELET
BASOS ABS: 0 10*3/uL (ref 0.0–0.1)
BASOS PCT: 0 %
EOS ABS: 0.9 10*3/uL — AB (ref 0.0–0.7)
Eosinophils Relative: 7 %
HEMATOCRIT: 34 % — AB (ref 39.0–52.0)
Hemoglobin: 11.1 g/dL — ABNORMAL LOW (ref 13.0–17.0)
Lymphocytes Relative: 16 %
Lymphs Abs: 1.9 10*3/uL (ref 0.7–4.0)
MCH: 29.7 pg (ref 26.0–34.0)
MCHC: 32.6 g/dL (ref 30.0–36.0)
MCV: 90.9 fL (ref 78.0–100.0)
MONO ABS: 1 10*3/uL (ref 0.1–1.0)
Monocytes Relative: 8 %
NEUTROS ABS: 8.5 10*3/uL — AB (ref 1.7–7.7)
Neutrophils Relative %: 69 %
PLATELETS: 313 10*3/uL (ref 150–400)
RBC: 3.74 MIL/uL — ABNORMAL LOW (ref 4.22–5.81)
RDW: 14.8 % (ref 11.5–15.5)
WBC: 12.4 10*3/uL — ABNORMAL HIGH (ref 4.0–10.5)

## 2016-02-14 LAB — COMPREHENSIVE METABOLIC PANEL
ALBUMIN: 3.4 g/dL — AB (ref 3.5–5.0)
ALT: 20 U/L (ref 17–63)
ANION GAP: 7 (ref 5–15)
AST: 18 U/L (ref 15–41)
Alkaline Phosphatase: 82 U/L (ref 38–126)
BILIRUBIN TOTAL: 0.5 mg/dL (ref 0.3–1.2)
BUN: 16 mg/dL (ref 6–20)
CHLORIDE: 107 mmol/L (ref 101–111)
CO2: 27 mmol/L (ref 22–32)
Calcium: 9.4 mg/dL (ref 8.9–10.3)
Creatinine, Ser: 1.13 mg/dL (ref 0.61–1.24)
GFR calc Af Amer: >60 mL/min (ref >60)
GFR calc non Af Amer: >60 mL/min (ref >60)
GLUCOSE: 107 mg/dL — AB (ref 65–99)
POTASSIUM: 4.2 mmol/L (ref 3.5–5.1)
SODIUM: 141 mmol/L (ref 135–145)
Total Protein: 6.1 g/dL — ABNORMAL LOW (ref 6.5–8.1)

## 2016-02-14 LAB — I-STAT TROPONIN, ED: Troponin i, poc: 0.02 ng/mL (ref 0.00–0.08)

## 2016-02-14 LAB — BRAIN NATRIURETIC PEPTIDE: B NATRIURETIC PEPTIDE 5: 114 pg/mL — AB (ref 0.0–100.0)

## 2016-02-14 MED ORDER — ALBUTEROL SULFATE (2.5 MG/3ML) 0.083% IN NEBU
2.5000 mg | INHALATION_SOLUTION | Freq: Once | RESPIRATORY_TRACT | Status: AC
  Administered 2016-02-14: 2.5 mg via RESPIRATORY_TRACT
  Filled 2016-02-14: qty 3

## 2016-02-14 MED ORDER — PREDNISONE 20 MG PO TABS: ORAL_TABLET | ORAL | Status: DC

## 2016-02-14 MED ORDER — ALBUTEROL SULFATE (2.5 MG/3ML) 0.083% IN NEBU
5.0000 mg | INHALATION_SOLUTION | Freq: Once | RESPIRATORY_TRACT | Status: AC
  Administered 2016-02-14: 5 mg via RESPIRATORY_TRACT
  Filled 2016-02-14: qty 6

## 2016-02-14 MED ORDER — FUROSEMIDE 10 MG/ML IJ SOLN
20.0000 mg | Freq: Once | INTRAMUSCULAR | Status: AC
  Administered 2016-02-14: 20 mg via INTRAVENOUS
  Filled 2016-02-14: qty 2

## 2016-02-14 MED ORDER — IPRATROPIUM-ALBUTEROL 0.5-2.5 (3) MG/3ML IN SOLN
3.0000 mL | Freq: Once | RESPIRATORY_TRACT | Status: AC
  Administered 2016-02-14: 3 mL via RESPIRATORY_TRACT
  Filled 2016-02-14: qty 3

## 2016-02-14 MED ORDER — FUROSEMIDE 20 MG PO TABS: 20.0000 mg | ORAL_TABLET | Freq: Every day | ORAL | Status: DC

## 2016-02-14 NOTE — Discharge Instructions (Signed)
Follow-up with your family doctor and cardiologist as planned

## 2016-02-14 NOTE — ED Notes (Signed)
Shortness of breath started this afternoon. Patient denies pain at this time.

## 2016-02-14 NOTE — ED Notes (Signed)
Received report on pt who reports that he is feeling better, has sore noted to buttock region, duoderm placed per pt and family request, update given to pt and family at bedside

## 2016-02-14 NOTE — ED Provider Notes (Signed)
CSN: [id number]     Arrival date & time 02/14/16  1639 History   First MD Initiated Contact with Patient 02/14/16 1808     Chief Complaint  Patient presents with  . Shortness of Breath     (Consider location/radiation/quality/duration/timing/severity/associated sxs/prior Treatment) Patient is a 75 y.o. male presenting with shortness of breath. The history is provided by the patient (Patient complains of shortness of breath and swelling in his ankles).  Shortness of Breath Severity:  Mild Onset quality:  Sudden Timing:  Intermittent Progression:  Waxing and waning Chronicity:  New Context: activity   Associated symptoms: no abdominal pain, no chest pain, no cough, no headaches and no rash     Past Medical History  Diagnosis Date  . Ulcer   . Asthma   . Essential hypertension   . NSTEMI (non-ST elevated myocardial infarction) (HCC) 12/31/2015    Echo 01/01/16 (pre-CATH-PCI): EF 60-65%, no RWMA.   . 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 Moderate OM1 35% & midLAD 45%  . 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 Dyslipidemia, goal LDL below 70 01/02/2016  . COPD (chronic obstructive pulmonary disease) (HCC) 01/01/2016  . Lung nodule 01/01/2016  . Dementia   . BPH (benign prostatic hypertrophy) with urinary obstruction   . Acute urinary retention   . Elevated PSA    Past Surgical History  Procedure Laterality Date  . Hernia repair    . Cardiac catheterization N/A 01/01/2016    Procedure: Left Heart Cath and Coronary Angiography;  Surgeon: Marykay Lex, MD;  Location: [facility] INVASIVE CV LAB;  Service: Cardiovascular;  Laterality: N/A;  . Cardiac catheterization Right 01/01/2016    Procedure: Coronary Stent  Intervention;  Surgeon: Marykay Lex, MD;  Location: [facility] INVASIVE CV LAB;  Service: Cardiovascular;  Laterality: Right;  . Tonsillectomy    . Prostate biopsy     Family History  Problem Relation Age of Onset  . Diabetes Brother   . Cancer Neg Hx   . Stroke Neg Hx   . Heart disease Neg Hx    Social History  Substance Use Topics  . Smoking status: Former Smoker    Quit date: 10/23/2004  . Smokeless tobacco: None     Comment: Less than 1 pack per day 20 years  . Alcohol Use: No    Review of Systems  Constitutional: Negative for appetite change and fatigue.  HENT: Negative for congestion, ear discharge and sinus pressure.   Eyes: Negative for discharge.  Respiratory: Positive for shortness of breath. Negative for cough.   Cardiovascular: Negative for chest pain.  Gastrointestinal: Negative for abdominal pain and diarrhea.  Genitourinary: Negative for frequency and hematuria.  Musculoskeletal: Negative for back pain.       Swelling in ankles  Skin: Negative for rash.  Neurological: Negative for seizures and headaches.  Psychiatric/Behavioral: Negative for hallucinations.      Allergies  Review of patient's allergies indicates no known allergies.  Home Medications   Prior to Admission medications   Medication Sig Start Date End Date Taking? Authorizing Provider  acetaminophen (TYLENOL) 325 MG tablet Take 650 mg by mouth every 6 (six) hours as needed for moderate pain.  Yes Historical Provider, MD  alum & mag hydroxide-simeth (MAALOX/MYLANTA) 200-200-20 MG/5ML suspension Take 30 mLs by mouth every 6 (six) hours as needed for indigestion or heartburn. 01/11/16  Yes Belkys A Regalado, MD  aspirin EC 81 MG EC tablet Take 1 tablet (81 mg total) by mouth daily. 01/11/16  Yes Belkys A Regalado, MD  atorvastatin (LIPITOR) 80 MG tablet Take 1 tablet (80 mg total) by mouth daily at 6 PM. 01/11/16  Yes Belkys A Regalado, MD  carvedilol (COREG) 12.5 MG tablet Take 1 tablet (12.5 mg  total) by mouth 2 (two) times daily with a meal. 01/11/16  Yes Belkys A Regalado, MD  clopidogrel (PLAVIX) 75 MG tablet Take 1 tablet (75 mg total) by mouth daily. 02/04/16  Yes Standley Brookinganiel P Goodrich, MD  finasteride (PROSCAR) 5 MG tablet Take 5 mg by mouth daily.  06/09/14  Yes Historical Provider, MD  Fish Oil-Cholecalciferol (FISH OIL + D3 PO) Take 1 capsule by mouth at bedtime.   Yes Historical Provider, MD  losartan (COZAAR) 100 MG tablet Take 1 tablet (100 mg total) by mouth daily. 01/11/16  Yes Belkys A Regalado, MD  nitroGLYCERIN (NITROSTAT) 0.4 MG SL tablet Place 1 tablet (0.4 mg total) under the tongue every 5 (five) minutes as needed for chest pain. 01/11/16  Yes Belkys A Regalado, MD  potassium chloride (K-DUR) 10 MEQ tablet Take 10 mEq by mouth daily.   Yes Historical Provider, MD  SPIRIVA HANDIHALER 18 MCG inhalation capsule Place 1 capsule into inhaler and inhale daily. 12/03/15  Yes Historical Provider, MD  tamsulosin (FLOMAX) 0.4 MG CAPS capsule Take 0.8 mg by mouth at bedtime.  06/09/14  Yes Historical Provider, MD  VENTOLIN HFA 108 (90 BASE) MCG/ACT inhaler Inhale 2 puffs into the lungs every 6 (six) hours as needed for wheezing or shortness of breath.  06/30/14  Yes Historical Provider, MD  albuterol (PROVENTIL) (2.5 MG/3ML) 0.083% nebulizer solution Take 3 mLs (2.5 mg total) by nebulization every 2 (two) hours as needed for wheezing. 01/11/16   Belkys A Regalado, MD  doxycycline (VIBRA-TABS) 100 MG tablet Take 1 tablet (100 mg total) by mouth every 12 (twelve) hours. Patient not taking: Reported on 02/14/2016 02/04/16   Standley Brookinganiel P Goodrich, MD  furosemide (LASIX) 20 MG tablet Take 1 tablet (20 mg total) by mouth daily. 02/14/16   Bethann BerkshireJoseph Murl Golladay, MD  predniSONE (DELTASONE) 20 MG tablet 2 tabs po daily x 3 days 02/14/16   Bethann BerkshireJoseph Rasaan Brotherton, MD   BP 134/74 mmHg  Pulse 65  Temp(Src) 98.4 F (36.9 C) (Oral)  Resp 17  Ht 5\' 8"  (1.727 m)  Wt 177 lb (80.287 kg)  BMI 26.92 kg/m2  SpO2 95% Physical Exam   Constitutional: He is oriented to person, place, and time. He appears well-developed.  HENT:  Head: Normocephalic.  Eyes: Conjunctivae and EOM are normal. No scleral icterus.  Neck: Neck supple. No thyromegaly present.  Cardiovascular: Normal rate and regular rhythm.  Exam reveals no gallop and no friction rub.   No murmur heard. Pulmonary/Chest: No stridor. He has wheezes. He has no rales. He exhibits no tenderness.  Mild wheezing  Abdominal: He exhibits no distension. There is no tenderness. There is no rebound.  Musculoskeletal: Normal range of motion. He exhibits edema.  1+ edema in both ankles  Lymphadenopathy:    He has no cervical adenopathy.  Neurological: He is oriented to person, place, and time. He exhibits normal muscle tone. Coordination normal.  Skin: No rash noted. No erythema.  Psychiatric: He has a normal mood and affect. His behavior is normal.    ED Course  Procedures (including critical care time) Labs Review Labs Reviewed  CBC WITH DIFFERENTIAL/PLATELET - Abnormal; Notable for the following:    WBC 12.4 (*)    RBC 3.74 (*)    Hemoglobin 11.1 (*)    HCT 34.0 (*)    Neutro Abs 8.5 (*)    Eosinophils Absolute 0.9 (*)    All other components within normal limits  COMPREHENSIVE METABOLIC PANEL - Abnormal; Notable for the following:    Glucose, Bld 107 (*)    Total Protein 6.1 (*)    Albumin 3.4 (*)    All other components within normal limits  BRAIN NATRIURETIC PEPTIDE - Abnormal; Notable for the following:    B Natriuretic Peptide 114.0 (*)    All other components within normal limits  I-STAT TROPOININ, ED    Imaging Review Dg Chest 2 View  02/14/2016  CLINICAL DATA:  Shortness of breath beginning this afternoon. History of asthma, hypertension, COPD and CAD. EXAM: CHEST  2 VIEW COMPARISON:  01/28/2016; 01/03/2016; 08/18/2015; chest CT - 12/31/2015 FINDINGS: Grossly unchanged cardiac silhouette and mediastinal contours. The lungs remain hyperexpanded  with flattening of the diaphragms. No focal airspace opacities. No pleural effusion or pneumothorax. No evidence of edema. No acute osseous abnormalities. Known right upper lobe pulmonary nodule is not well demonstrated on the present examination. IMPRESSION: 1. Hyperexpanded lungs without acute cardiopulmonary disease. 2. Known right upper lobe pulmonary nodule is not well demonstrated on the present examination though resolution should not be excluded on the basis of this examination. Electronically Signed   By: Simonne Come M.D.   On: 02/14/2016 19:06   I have personally reviewed and evaluated these images and lab results as part of my medical decision-making.   EKG Interpretation None      MDM   Final diagnoses:  COPD exacerbation (HCC)  Edema, unspecified type    Patient improved with neb treatments.   Suspect shortness breath and wheezing is related to exacerbation of COPD. Patient will be put on prednisone for 3 days and given Lasix for a week to help with his peripheral edema    Bethann Berkshire, MD 02/14/16 2138

## 2016-02-16 DIAGNOSIS — R310 Gross hematuria: Secondary | ICD-10-CM | POA: Diagnosis not present

## 2016-02-16 DIAGNOSIS — I2510 Atherosclerotic heart disease of native coronary artery without angina pectoris: Secondary | ICD-10-CM | POA: Diagnosis not present

## 2016-02-16 DIAGNOSIS — L89151 Pressure ulcer of sacral region, stage 1: Secondary | ICD-10-CM | POA: Diagnosis not present

## 2016-02-16 DIAGNOSIS — D62 Acute posthemorrhagic anemia: Secondary | ICD-10-CM | POA: Diagnosis not present

## 2016-02-16 DIAGNOSIS — N401 Enlarged prostate with lower urinary tract symptoms: Secondary | ICD-10-CM | POA: Diagnosis not present

## 2016-02-16 DIAGNOSIS — J449 Chronic obstructive pulmonary disease, unspecified: Secondary | ICD-10-CM | POA: Diagnosis not present

## 2016-02-16 DIAGNOSIS — I10 Essential (primary) hypertension: Secondary | ICD-10-CM | POA: Diagnosis not present

## 2016-02-16 DIAGNOSIS — Z436 Encounter for attention to other artificial openings of urinary tract: Secondary | ICD-10-CM | POA: Diagnosis not present

## 2016-02-16 DIAGNOSIS — E785 Hyperlipidemia, unspecified: Secondary | ICD-10-CM | POA: Diagnosis not present

## 2016-02-18 DIAGNOSIS — J449 Chronic obstructive pulmonary disease, unspecified: Secondary | ICD-10-CM | POA: Diagnosis not present

## 2016-02-18 DIAGNOSIS — D62 Acute posthemorrhagic anemia: Secondary | ICD-10-CM | POA: Diagnosis not present

## 2016-02-18 DIAGNOSIS — R310 Gross hematuria: Secondary | ICD-10-CM | POA: Diagnosis not present

## 2016-02-18 DIAGNOSIS — E785 Hyperlipidemia, unspecified: Secondary | ICD-10-CM | POA: Diagnosis not present

## 2016-02-18 DIAGNOSIS — I2510 Atherosclerotic heart disease of native coronary artery without angina pectoris: Secondary | ICD-10-CM | POA: Diagnosis not present

## 2016-02-18 DIAGNOSIS — L89151 Pressure ulcer of sacral region, stage 1: Secondary | ICD-10-CM | POA: Diagnosis not present

## 2016-02-18 DIAGNOSIS — N401 Enlarged prostate with lower urinary tract symptoms: Secondary | ICD-10-CM | POA: Diagnosis not present

## 2016-02-18 DIAGNOSIS — I10 Essential (primary) hypertension: Secondary | ICD-10-CM | POA: Diagnosis not present

## 2016-02-18 DIAGNOSIS — Z436 Encounter for attention to other artificial openings of urinary tract: Secondary | ICD-10-CM | POA: Diagnosis not present

## 2016-02-21 ENCOUNTER — Encounter (HOSPITAL_COMMUNITY)
Admission: RE | Admit: 2016-02-21 | Discharge: 2016-02-21 | Disposition: A | Discharge status: Home / Self Care | Payer: Medicare Other | Source: Skilled Nursing Facility | Attending: Internal Medicine | Admitting: Internal Medicine

## 2016-02-21 DIAGNOSIS — J449 Chronic obstructive pulmonary disease, unspecified: Secondary | ICD-10-CM | POA: Insufficient documentation

## 2016-02-21 DIAGNOSIS — J189 Pneumonia, unspecified organism: Secondary | ICD-10-CM | POA: Insufficient documentation

## 2016-02-21 DIAGNOSIS — M6281 Muscle weakness (generalized): Secondary | ICD-10-CM | POA: Insufficient documentation

## 2016-02-21 DIAGNOSIS — I25118 Atherosclerotic heart disease of native coronary artery with other forms of angina pectoris: Secondary | ICD-10-CM | POA: Insufficient documentation

## 2016-02-22 DIAGNOSIS — J449 Chronic obstructive pulmonary disease, unspecified: Secondary | ICD-10-CM | POA: Diagnosis not present

## 2016-02-22 DIAGNOSIS — R310 Gross hematuria: Secondary | ICD-10-CM | POA: Diagnosis not present

## 2016-02-22 DIAGNOSIS — I2510 Atherosclerotic heart disease of native coronary artery without angina pectoris: Secondary | ICD-10-CM | POA: Diagnosis not present

## 2016-02-22 DIAGNOSIS — Z436 Encounter for attention to other artificial openings of urinary tract: Secondary | ICD-10-CM | POA: Diagnosis not present

## 2016-02-22 DIAGNOSIS — D62 Acute posthemorrhagic anemia: Secondary | ICD-10-CM | POA: Diagnosis not present

## 2016-02-22 DIAGNOSIS — N401 Enlarged prostate with lower urinary tract symptoms: Secondary | ICD-10-CM | POA: Diagnosis not present

## 2016-02-22 DIAGNOSIS — L89151 Pressure ulcer of sacral region, stage 1: Secondary | ICD-10-CM | POA: Diagnosis not present

## 2016-02-22 DIAGNOSIS — I10 Essential (primary) hypertension: Secondary | ICD-10-CM | POA: Diagnosis not present

## 2016-02-22 DIAGNOSIS — E785 Hyperlipidemia, unspecified: Secondary | ICD-10-CM | POA: Diagnosis not present

## 2016-02-23 DIAGNOSIS — R269 Unspecified abnormalities of gait and mobility: Secondary | ICD-10-CM | POA: Diagnosis not present

## 2016-02-23 DIAGNOSIS — G253 Myoclonus: Secondary | ICD-10-CM | POA: Diagnosis not present

## 2016-02-23 DIAGNOSIS — G467 Other lacunar syndromes: Secondary | ICD-10-CM | POA: Diagnosis not present

## 2016-02-23 DIAGNOSIS — G309 Alzheimer's disease, unspecified: Secondary | ICD-10-CM | POA: Diagnosis not present

## 2016-02-24 DIAGNOSIS — R310 Gross hematuria: Secondary | ICD-10-CM | POA: Diagnosis not present

## 2016-02-24 DIAGNOSIS — J449 Chronic obstructive pulmonary disease, unspecified: Secondary | ICD-10-CM | POA: Diagnosis not present

## 2016-02-24 DIAGNOSIS — Z436 Encounter for attention to other artificial openings of urinary tract: Secondary | ICD-10-CM | POA: Diagnosis not present

## 2016-02-24 DIAGNOSIS — N401 Enlarged prostate with lower urinary tract symptoms: Secondary | ICD-10-CM | POA: Diagnosis not present

## 2016-02-24 DIAGNOSIS — I10 Essential (primary) hypertension: Secondary | ICD-10-CM | POA: Diagnosis not present

## 2016-02-24 DIAGNOSIS — E785 Hyperlipidemia, unspecified: Secondary | ICD-10-CM | POA: Diagnosis not present

## 2016-02-24 DIAGNOSIS — I2510 Atherosclerotic heart disease of native coronary artery without angina pectoris: Secondary | ICD-10-CM | POA: Diagnosis not present

## 2016-02-24 DIAGNOSIS — D62 Acute posthemorrhagic anemia: Secondary | ICD-10-CM | POA: Diagnosis not present

## 2016-02-24 DIAGNOSIS — L89151 Pressure ulcer of sacral region, stage 1: Secondary | ICD-10-CM | POA: Diagnosis not present

## 2016-02-28 ENCOUNTER — Ambulatory Visit (INDEPENDENT_AMBULATORY_CARE_PROVIDER_SITE_OTHER): Payer: Medicare Other | Admitting: Cardiology

## 2016-02-28 ENCOUNTER — Encounter: Payer: Self-pay | Admitting: Cardiology

## 2016-02-28 VITALS — BP 140/68 | HR 65 | Ht 68.0 in | Wt 186.0 lb

## 2016-02-28 DIAGNOSIS — Z79899 Other long term (current) drug therapy: Secondary | ICD-10-CM | POA: Diagnosis not present

## 2016-02-28 DIAGNOSIS — R42 Dizziness and giddiness: Secondary | ICD-10-CM

## 2016-02-28 DIAGNOSIS — I10 Essential (primary) hypertension: Secondary | ICD-10-CM | POA: Diagnosis not present

## 2016-02-28 DIAGNOSIS — I5032 Chronic diastolic (congestive) heart failure: Secondary | ICD-10-CM | POA: Diagnosis not present

## 2016-02-28 DIAGNOSIS — I2510 Atherosclerotic heart disease of native coronary artery without angina pectoris: Secondary | ICD-10-CM

## 2016-02-28 MED ORDER — LOSARTAN POTASSIUM 50 MG PO TABS: 50.0000 mg | ORAL_TABLET | Freq: Every day | ORAL | Status: AC

## 2016-02-28 MED ORDER — FUROSEMIDE 20 MG PO TABS: 20.0000 mg | ORAL_TABLET | Freq: Every day | ORAL | Status: AC

## 2016-02-28 NOTE — Patient Instructions (Signed)
Your physician recommends that you schedule a follow-up appointment in: 6 weeks   DECREASE Cozaar 50 mg daily  Take Lasix 20 mg daily   Get labs : BMET just before next visit    Thank you for choosing [facility] !

## 2016-02-28 NOTE — Progress Notes (Signed)
Cardiology Office Note  Date: 02/28/2016   ID: Lee Chang Jun 19, 1941, MRN [id number]  PCP: Lee Flavin, MD  Evaluating Cardiologist: Lee Dell, MD   Chief Complaint  Patient presents with  . Coronary Artery Disease    History of Present Illness: Lee Chang is a 75 y.o. male presenting to the office for a follow-up visit, most recently seen by Ms. Lee Chang in March. This is our first meeting, I reviewed records and updated his chart. He is status post NSTEMI in March of this year with placement of DES 2 to the proximal and mid RCA at that time. He was on aspirin and Brilinta, readmitted in April with gross hematuria and was switched to aspirin and Plavix at that time (saw Dr. Wyline Chang in consultation).  He presents today with his daughter. He has been home for about 3 weeks, daughter is his care provider. He has dementia but is functional with basic ADLs. He has had an indwelling Foley catheter for several weeks and follows with urology. He does not report any further hematuria. Also no angina symptoms. He has described recurring orthostatic dizziness, no syncope. Medications include antihypertensives and also Flomax.  We went over his medications today. I see he was given a limited course of Lasix after ER visit with COPD exacerbation and leg edema. His leg edema is improving, but has not resolved. We discussed cutting his Cozaar dose back and continuing Lasix 20 mg daily. We talked about the possibility of compression stockings, and hopefully he may be able to come off Flomax at some point.  Past Medical History  Diagnosis Date  . Ulcer   . Asthma   . Essential hypertension   . NSTEMI (non-ST elevated myocardial infarction) (HCC) 12/31/2015    Echo 01/01/16 (pre-CATH-PCI): EF 60-65%, no RWMA.   . 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 Moderate OM1 35% & midLAD 45%  . 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 Dyslipidemia, goal LDL below 70 01/02/2016  . COPD (chronic obstructive pulmonary disease) (HCC) 01/01/2016  . Lung nodule 01/01/2016  . Dementia   . BPH (benign prostatic hypertrophy) with urinary obstruction   . Acute urinary retention   . Elevated PSA     Past Surgical History  Procedure Laterality Date  . Hernia repair    . Cardiac catheterization N/A 01/01/2016    Procedure: Left Heart Cath and Coronary Angiography;  Surgeon: Marykay Lex, MD;  Location: [facility] INVASIVE CV LAB;  Service: Cardiovascular;  Laterality: N/A;  . Cardiac catheterization Right 01/01/2016    Procedure: Coronary Stent Intervention;  Surgeon: Marykay Lex, MD;  Location: [facility] INVASIVE CV LAB;  Service: Cardiovascular;  Laterality: Right;  . Tonsillectomy    . Prostate biopsy      Current Outpatient Prescriptions  Medication Sig Dispense Refill  . acetaminophen (TYLENOL) 325 MG tablet Take 650 mg by mouth every 6 (six) hours as needed for moderate pain.    Marland Kitchen albuterol (PROVENTIL) (2.5 MG/3ML) 0.083% nebulizer solution Take 3 mLs (2.5 mg total) by nebulization every 2 (two) hours as needed for wheezing. 75 mL 12  . alum & mag hydroxide-simeth (MAALOX/MYLANTA) 200-200-20 MG/5ML suspension  Take 30 mLs by mouth every 6 (six) hours as needed for indigestion or heartburn. 355 mL 0  . aspirin EC 81 MG EC tablet Take 1 tablet (81 mg total) by mouth daily. 30 tablet 0  . atorvastatin (LIPITOR) 80 MG tablet Take 1 tablet (80 mg total) by mouth daily at 6 PM. 30 tablet 0  . carvedilol (COREG) 12.5 MG tablet Take 1 tablet (12.5 mg total) by mouth 2 (two) times daily with a meal. 60 tablet 0  . clopidogrel (PLAVIX) 75 MG tablet Take 1 tablet (75 mg total) by mouth daily. 30 tablet 0  . finasteride  (PROSCAR) 5 MG tablet Take 5 mg by mouth daily.     . Fish Oil-Cholecalciferol (FISH OIL + D3 PO) Take 1 capsule by mouth at bedtime.    . memantine (NAMENDA) 5 MG tablet Take 5 mg by mouth 2 (two) times daily.    . nitroGLYCERIN (NITROSTAT) 0.4 MG SL tablet Place 1 tablet (0.4 mg total) under the tongue every 5 (five) minutes as needed for chest pain. 30 tablet 0  . potassium chloride (K-DUR) 10 MEQ tablet Take 10 mEq by mouth daily.    Marland Kitchen SPIRIVA HANDIHALER 18 MCG inhalation capsule Place 1 capsule into inhaler and inhale daily.    . tamsulosin (FLOMAX) 0.4 MG CAPS capsule Take 0.8 mg by mouth at bedtime.     . VENTOLIN HFA 108 (90 BASE) MCG/ACT inhaler Inhale 2 puffs into the lungs every 6 (six) hours as needed for wheezing or shortness of breath.     . furosemide (LASIX) 20 MG tablet Take 1 tablet (20 mg total) by mouth daily. 90 tablet 3  . losartan (COZAAR) 50 MG tablet Take 1 tablet (50 mg total) by mouth daily. 90 tablet 3   No current facility-administered medications for this visit.   Allergies:  Review of patient's allergies indicates no known allergies.   Social History: The patient  reports that he quit smoking about 11 years ago. His smoking use included Cigarettes. He does not have any smokeless tobacco history on file. He reports that he does not drink alcohol.   ROS:  Please see the history of present illness. Otherwise, complete review of systems is positive for dementia with memory loss.  All other systems are reviewed and negative.   Physical Exam: VS:  BP 140/68 mmHg  Pulse 65  Ht 5\' 8"  (1.727 m)  Wt 186 lb (84.369 kg)  BMI 28.29 kg/m2  SpO2 94%, BMI Body mass index is 28.29 kg/(m^2).  Wt Readings from Last 3 Encounters:  02/28/16 186 lb (84.369 kg)  02/14/16 177 lb (80.287 kg)  02/04/16 167 lb 3.2 oz (75.841 kg)    General: Elderly male, appears comfortable at rest. HEENT: Conjunctiva and lids normal, oropharynx clear. Neck: Supple, no elevated JVP or carotid  bruits, no thyromegaly. Lungs: Decreased breath sounds without wheezing, nonlabored breathing at rest. Cardiac: Regular rate and rhythm, no S3 soft systolic murmur, no pericardial rub. Abdomen: Soft, nontender, bowel sounds present, no guarding or rebound. Extremities: 2+ leg edema, distal pulses 2+. Skin: Warm and dry. Musculoskeletal: No kyphosis. Neuropsychiatric: Alert and oriented x2, affect grossly appropriate.  ECG: I personally reviewed the recent tracing from 02/14/2016 which showed sinus rhythm with incomplete right bundle-branch block and nonspecific T-wave changes.  Recent Labwork: 01/01/2016: TSH 1.717 02/03/2016: Magnesium 1.4* 02/14/2016: ALT 20; AST 18; B Natriuretic Peptide 114.0*; BUN 16; Creatinine, Ser 1.13; Hemoglobin 11.1*; Platelets 313; Potassium 4.2; Sodium 141  Other Studies Reviewed Today:  Echocardiogram 01/01/2016: Study Conclusions  - Left ventricle: The cavity size was normal. Systolic function was  normal. The estimated ejection fraction was in the range of 60%  to 65%. Wall motion was normal; there were no regional wall  motion abnormalities. - Aortic valve: Valve area (VTI): 2.88 cm^2. Valve area (Vmax):  3.11 cm^2. Valve area (Vmean): 2.62 cm^2.  Cardiac catheterization 01/01/2016: 1. Prox RCA lesion, 99% stenosed. Mid RCA-1 lesion, 60% stenosed. Distal Mid RCA-2 lesion, 80% stenosed. Post intervention wth 2 overlapping Synergy DES (3.0 mm x 28 mm & 3.5 mm x 38 mm) stents, there is a 0% residual stenosis. 2. RPDA lesion, 100% stenosed. Likely distal thomboembolism from the original MI 3. Prox LAD to Mid LAD lesion, 45% stenosed. 4. 1st Mrg lesion, 35% stenosed. 5. The left ventricular systolic function is normal with apical inferior hypokinesis - consistent with occluded distal rPDA   Severe Single Vessel CAD involving the prox-mid RCA with 99% ulcerated-thrombotic occlusion of the prox RCA with diffuse 60-80% mid RCA disease. Thromboembolic  occlusion of the distal rPDA - with some improvement following upstream PCI.  Successful extensive PCI of prox-mid RCA with 2 overlapping Synergy DES, post-dilated in a tapered fashion from 4.2 mm to 3.2 mm.  Assessment and Plan:  1. CAD status post recent NSTEMI with placement of DES 2 to the proximal and mid RCA in March. DAPT now includes aspirin and Plavix, he was taken off of Brilinta due to gross hematuria. Tolerating the current regimen. No changes were made today.  2. Orthostatic dizziness. We will cut Cozaar back to 50 mg daily for now. We discussed the possibility of compression stockings next, hopefully he could come off of Flomax eventually but still has a Foley catheter in place and follows with urology.  3. Essential hypertension. Reducing Cozaar due to orthostatic dizziness.  4. Diastolic heart failure with leg edema, resuming Lasix at 20 mg daily. Follow-up BMET for next visit.  Current medicines were reviewed with the patient today.   Orders Placed This Encounter  Procedures  . Basic Metabolic Panel (BMET)    Disposition: FU with me in 6 weeks.   Signed, Jonelle SidleSamuel G. McDowell, MD, [facility] Hospital[facility]ACC 02/28/2016 1:55 PM    [facility] at [facility] [address], [place], [place] [address] Phone: [phone]; Fax: [phone]

## 2016-03-05 DIAGNOSIS — J441 Chronic obstructive pulmonary disease with (acute) exacerbation: Secondary | ICD-10-CM | POA: Diagnosis not present

## 2016-03-05 DIAGNOSIS — G3184 Mild cognitive impairment, so stated: Secondary | ICD-10-CM | POA: Diagnosis not present

## 2016-03-05 DIAGNOSIS — I10 Essential (primary) hypertension: Secondary | ICD-10-CM | POA: Diagnosis not present

## 2016-03-13 DIAGNOSIS — R338 Other retention of urine: Secondary | ICD-10-CM | POA: Diagnosis not present

## 2016-03-13 DIAGNOSIS — N139 Obstructive and reflux uropathy, unspecified: Secondary | ICD-10-CM | POA: Diagnosis not present

## 2016-03-13 DIAGNOSIS — N401 Enlarged prostate with lower urinary tract symptoms: Secondary | ICD-10-CM | POA: Diagnosis not present

## 2016-03-21 DIAGNOSIS — R338 Other retention of urine: Secondary | ICD-10-CM | POA: Diagnosis not present

## 2016-03-28 DIAGNOSIS — G467 Other lacunar syndromes: Secondary | ICD-10-CM | POA: Diagnosis not present

## 2016-03-28 DIAGNOSIS — G253 Myoclonus: Secondary | ICD-10-CM | POA: Diagnosis not present

## 2016-03-28 DIAGNOSIS — G4752 REM sleep behavior disorder: Secondary | ICD-10-CM | POA: Diagnosis not present

## 2016-03-28 DIAGNOSIS — Z79899 Other long term (current) drug therapy: Secondary | ICD-10-CM | POA: Diagnosis not present

## 2016-03-28 DIAGNOSIS — R269 Unspecified abnormalities of gait and mobility: Secondary | ICD-10-CM | POA: Diagnosis not present

## 2016-03-28 DIAGNOSIS — F0390 Unspecified dementia without behavioral disturbance: Secondary | ICD-10-CM | POA: Diagnosis not present

## 2016-03-29 LAB — BASIC METABOLIC PANEL
BUN: 12 mg/dL (ref 7–25)
CALCIUM: 9.1 mg/dL (ref 8.6–10.3)
CO2: 29 mmol/L (ref 20–31)
Chloride: 107 mmol/L (ref 98–110)
Creat: 1.18 mg/dL (ref 0.70–1.18)
GLUCOSE: 104 mg/dL — AB (ref 65–99)
Potassium: 3.7 mmol/L (ref 3.5–5.3)
Sodium: 142 mmol/L (ref 135–146)

## 2016-04-03 DIAGNOSIS — S81011A Laceration without foreign body, right knee, initial encounter: Secondary | ICD-10-CM | POA: Diagnosis not present

## 2016-04-03 DIAGNOSIS — I10 Essential (primary) hypertension: Secondary | ICD-10-CM | POA: Diagnosis not present

## 2016-04-03 DIAGNOSIS — Z7902 Long term (current) use of antithrombotics/antiplatelets: Secondary | ICD-10-CM | POA: Diagnosis not present

## 2016-04-03 DIAGNOSIS — J449 Chronic obstructive pulmonary disease, unspecified: Secondary | ICD-10-CM | POA: Diagnosis not present

## 2016-04-03 DIAGNOSIS — Z79899 Other long term (current) drug therapy: Secondary | ICD-10-CM | POA: Diagnosis not present

## 2016-04-03 DIAGNOSIS — S30811A Abrasion of abdominal wall, initial encounter: Secondary | ICD-10-CM | POA: Diagnosis not present

## 2016-04-03 DIAGNOSIS — S51011A Laceration without foreign body of right elbow, initial encounter: Secondary | ICD-10-CM | POA: Diagnosis not present

## 2016-04-03 DIAGNOSIS — S80211A Abrasion, right knee, initial encounter: Secondary | ICD-10-CM | POA: Diagnosis not present

## 2016-04-03 DIAGNOSIS — S8991XA Unspecified injury of right lower leg, initial encounter: Secondary | ICD-10-CM | POA: Diagnosis not present

## 2016-04-03 DIAGNOSIS — W109XXA Fall (on) (from) unspecified stairs and steps, initial encounter: Secondary | ICD-10-CM | POA: Diagnosis not present

## 2016-04-03 DIAGNOSIS — S81811A Laceration without foreign body, right lower leg, initial encounter: Secondary | ICD-10-CM | POA: Diagnosis not present

## 2016-04-04 ENCOUNTER — Ambulatory Visit: Payer: Medicare Other | Admitting: Cardiology

## 2016-04-05 DIAGNOSIS — G3184 Mild cognitive impairment, so stated: Secondary | ICD-10-CM | POA: Diagnosis not present

## 2016-04-05 DIAGNOSIS — S41111A Laceration without foreign body of right upper arm, initial encounter: Secondary | ICD-10-CM | POA: Diagnosis not present

## 2016-04-05 DIAGNOSIS — S81811A Laceration without foreign body, right lower leg, initial encounter: Secondary | ICD-10-CM | POA: Diagnosis not present

## 2016-04-06 DIAGNOSIS — I10 Essential (primary) hypertension: Secondary | ICD-10-CM | POA: Diagnosis not present

## 2016-04-06 DIAGNOSIS — S51011D Laceration without foreign body of right elbow, subsequent encounter: Secondary | ICD-10-CM | POA: Diagnosis not present

## 2016-04-06 DIAGNOSIS — J449 Chronic obstructive pulmonary disease, unspecified: Secondary | ICD-10-CM | POA: Diagnosis not present

## 2016-04-06 DIAGNOSIS — S81811D Laceration without foreign body, right lower leg, subsequent encounter: Secondary | ICD-10-CM | POA: Diagnosis not present

## 2016-04-06 DIAGNOSIS — Z7951 Long term (current) use of inhaled steroids: Secondary | ICD-10-CM | POA: Diagnosis not present

## 2016-04-06 DIAGNOSIS — R296 Repeated falls: Secondary | ICD-10-CM | POA: Diagnosis not present

## 2016-04-06 DIAGNOSIS — S30811D Abrasion of abdominal wall, subsequent encounter: Secondary | ICD-10-CM | POA: Diagnosis not present

## 2016-04-06 DIAGNOSIS — Z7982 Long term (current) use of aspirin: Secondary | ICD-10-CM | POA: Diagnosis not present

## 2016-04-06 DIAGNOSIS — Z466 Encounter for fitting and adjustment of urinary device: Secondary | ICD-10-CM | POA: Diagnosis not present

## 2016-04-06 DIAGNOSIS — N319 Neuromuscular dysfunction of bladder, unspecified: Secondary | ICD-10-CM | POA: Diagnosis not present

## 2016-04-06 DIAGNOSIS — S80211D Abrasion, right knee, subsequent encounter: Secondary | ICD-10-CM | POA: Diagnosis not present

## 2016-04-07 DIAGNOSIS — R404 Transient alteration of awareness: Secondary | ICD-10-CM | POA: Diagnosis not present

## 2016-04-07 DIAGNOSIS — R2689 Other abnormalities of gait and mobility: Secondary | ICD-10-CM | POA: Diagnosis not present

## 2016-04-07 DIAGNOSIS — S0990XA Unspecified injury of head, initial encounter: Secondary | ICD-10-CM | POA: Diagnosis not present

## 2016-04-07 DIAGNOSIS — N390 Urinary tract infection, site not specified: Secondary | ICD-10-CM | POA: Diagnosis not present

## 2016-04-07 DIAGNOSIS — N3000 Acute cystitis without hematuria: Secondary | ICD-10-CM | POA: Diagnosis not present

## 2016-04-07 DIAGNOSIS — R531 Weakness: Secondary | ICD-10-CM | POA: Diagnosis not present

## 2016-04-08 DIAGNOSIS — N390 Urinary tract infection, site not specified: Secondary | ICD-10-CM | POA: Diagnosis not present

## 2016-04-09 DIAGNOSIS — N390 Urinary tract infection, site not specified: Secondary | ICD-10-CM | POA: Diagnosis not present

## 2016-04-10 DIAGNOSIS — Z7401 Bed confinement status: Secondary | ICD-10-CM | POA: Diagnosis not present

## 2016-04-10 DIAGNOSIS — Z9181 History of falling: Secondary | ICD-10-CM | POA: Diagnosis not present

## 2016-04-10 DIAGNOSIS — S81001A Unspecified open wound, right knee, initial encounter: Secondary | ICD-10-CM | POA: Diagnosis not present

## 2016-04-10 DIAGNOSIS — N390 Urinary tract infection, site not specified: Secondary | ICD-10-CM | POA: Diagnosis not present

## 2016-04-10 DIAGNOSIS — R279 Unspecified lack of coordination: Secondary | ICD-10-CM | POA: Diagnosis not present

## 2016-04-10 DIAGNOSIS — I10 Essential (primary) hypertension: Secondary | ICD-10-CM | POA: Diagnosis not present

## 2016-04-10 DIAGNOSIS — Z87891 Personal history of nicotine dependence: Secondary | ICD-10-CM | POA: Diagnosis not present

## 2016-04-10 DIAGNOSIS — F05 Delirium due to known physiological condition: Secondary | ICD-10-CM | POA: Diagnosis not present

## 2016-04-10 DIAGNOSIS — F0390 Unspecified dementia without behavioral disturbance: Secondary | ICD-10-CM | POA: Diagnosis not present

## 2016-04-10 DIAGNOSIS — S81811A Laceration without foreign body, right lower leg, initial encounter: Secondary | ICD-10-CM | POA: Diagnosis not present

## 2016-04-10 DIAGNOSIS — S31000A Unspecified open wound of lower back and pelvis without penetration into retroperitoneum, initial encounter: Secondary | ICD-10-CM | POA: Diagnosis not present

## 2016-04-10 DIAGNOSIS — Z888 Allergy status to other drugs, medicaments and biological substances status: Secondary | ICD-10-CM | POA: Diagnosis not present

## 2016-04-10 DIAGNOSIS — M79606 Pain in leg, unspecified: Secondary | ICD-10-CM | POA: Diagnosis not present

## 2016-04-10 DIAGNOSIS — S41101A Unspecified open wound of right upper arm, initial encounter: Secondary | ICD-10-CM | POA: Diagnosis not present

## 2016-04-10 DIAGNOSIS — J449 Chronic obstructive pulmonary disease, unspecified: Secondary | ICD-10-CM | POA: Diagnosis not present

## 2016-04-10 DIAGNOSIS — Z886 Allergy status to analgesic agent status: Secondary | ICD-10-CM | POA: Diagnosis not present

## 2016-04-10 DIAGNOSIS — T83518A Infection and inflammatory reaction due to other urinary catheter, initial encounter: Secondary | ICD-10-CM | POA: Diagnosis not present

## 2016-04-10 DIAGNOSIS — I252 Old myocardial infarction: Secondary | ICD-10-CM | POA: Diagnosis not present

## 2016-04-10 DIAGNOSIS — Z79899 Other long term (current) drug therapy: Secondary | ICD-10-CM | POA: Diagnosis not present

## 2016-04-10 DIAGNOSIS — I2510 Atherosclerotic heart disease of native coronary artery without angina pectoris: Secondary | ICD-10-CM | POA: Diagnosis not present

## 2016-04-10 DIAGNOSIS — S81801A Unspecified open wound, right lower leg, initial encounter: Secondary | ICD-10-CM | POA: Diagnosis not present

## 2016-04-11 ENCOUNTER — Ambulatory Visit: Payer: Medicare Other | Admitting: Cardiology

## 2016-04-22 DEATH — deceased

## 2018-02-01 IMAGING — CT CT CTA ABD/PEL W/CM AND/OR W/O CM
2 of 6 series · 13 of 46 positions shown, 17 images · IV contrast (Omnipaque 300)
Comparison: None.

CLINICAL DATA: Sudden onset of central chest pain radiating to the
back. Rule out dissection.

EXAM:
CT ANGIOGRAPHY CHEST, ABDOMEN AND PELVIS
TECHNIQUE: Multidetector CT imaging through the chest, abdomen and pelvis was
performed using the standard protocol during bolus administration of
intravenous contrast. Multiplanar reconstructed images and MIPs were
obtained and reviewed to evaluate the vascular anatomy.
CONTRAST:  100mL OMNIPAQUE IOHEXOL 350 MG/ML SOLN

[Series 5: dissection 3.0 b40f · axial · 0.83mm/px · z∈[+591,+1131]mm · 11 of 208 slices shown, 15 images]
[im 19/208  soft-tissue]
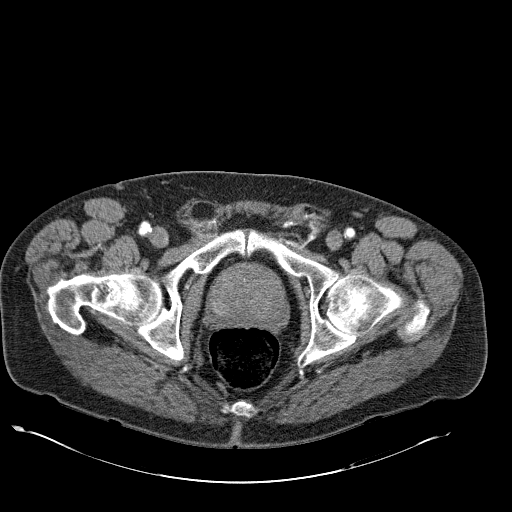
[im 19/208  bone]
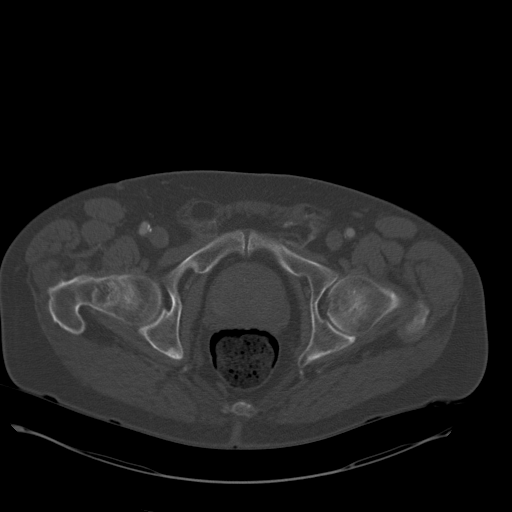
[im 37/208  soft-tissue]
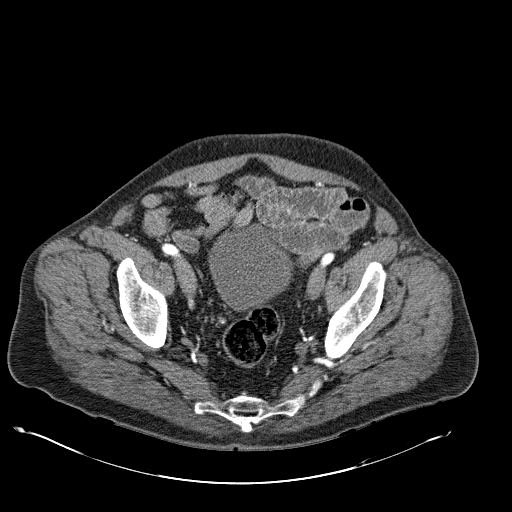
[im 64/208  soft-tissue]
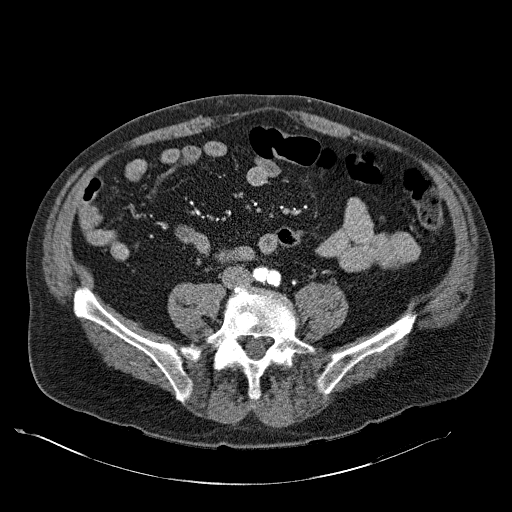
[im 82/208  soft-tissue]
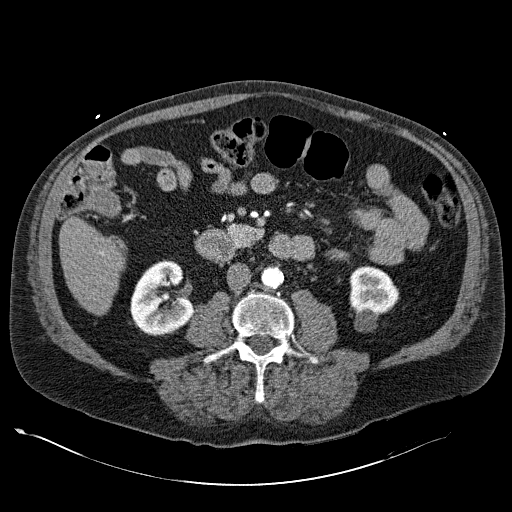
[im 109/208  soft-tissue]
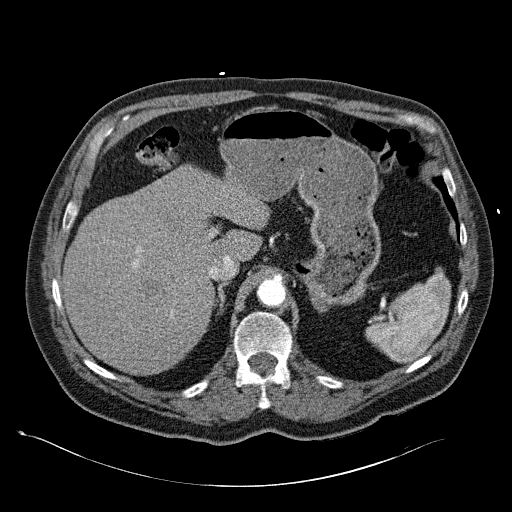
[im 127/208  soft-tissue]
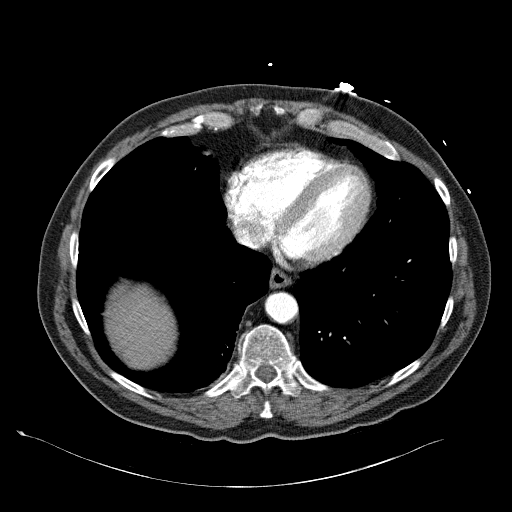
[im 145/208  soft-tissue]
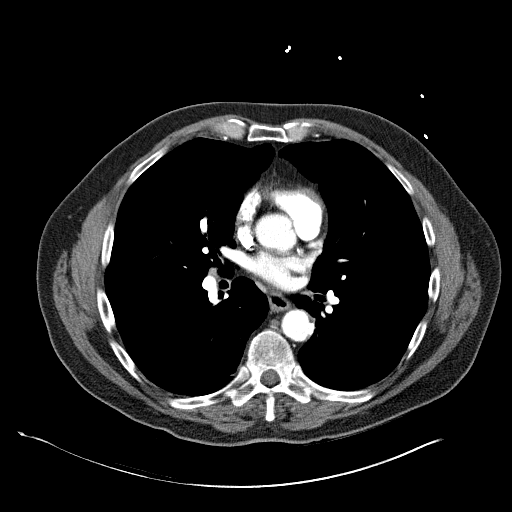
[im 172/208  soft-tissue]
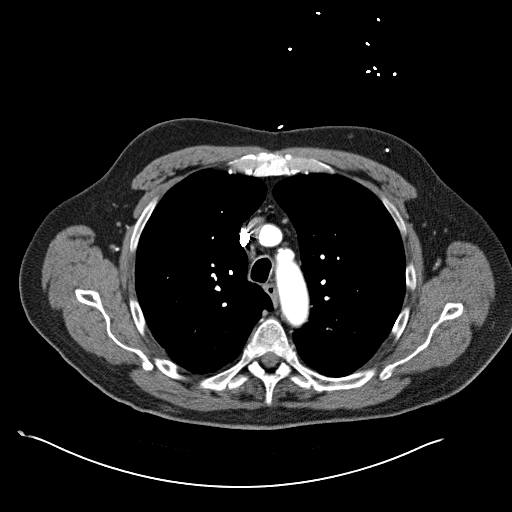
[im 172/208  lung]
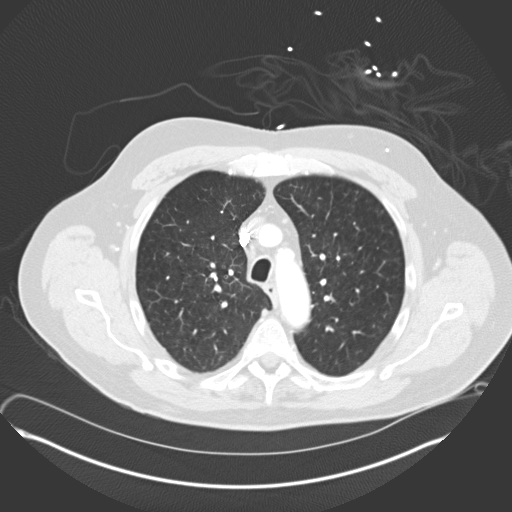
[im 181/208  lung]
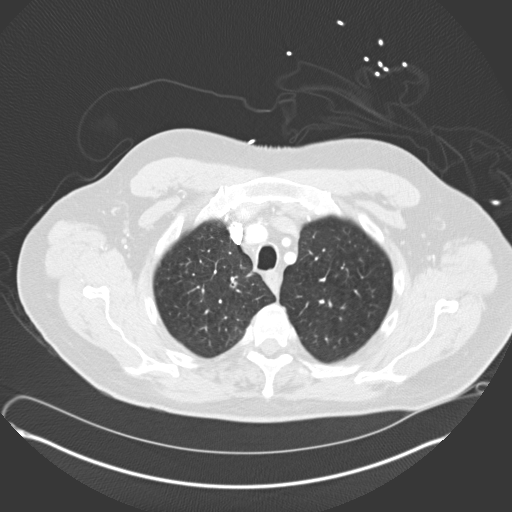
[im 190/208  soft-tissue]
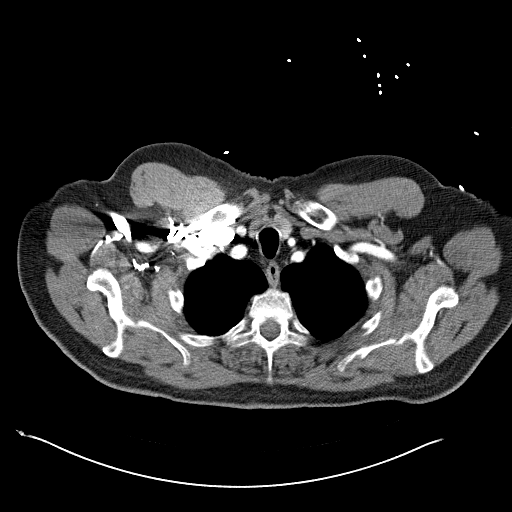
[im 190/208  lung]
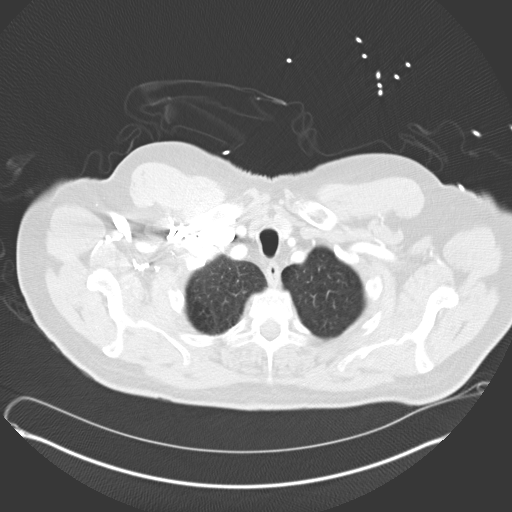
[im 190/208  bone]
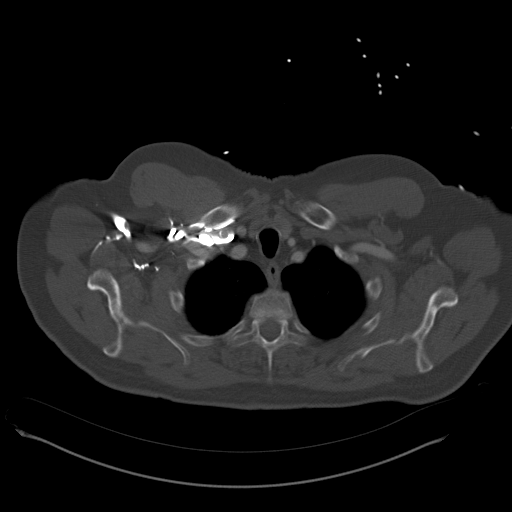
[im 199/208  lung]
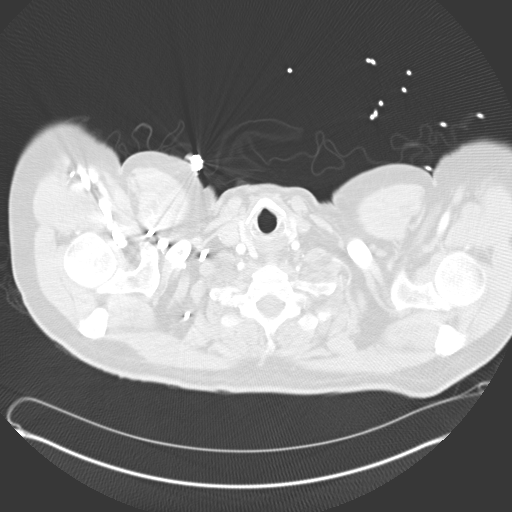

[Series 7: mpr cor post contrast · coronal · 0.83mm/px · 2 of 111 slices shown]
[im 37/111  soft-tissue]
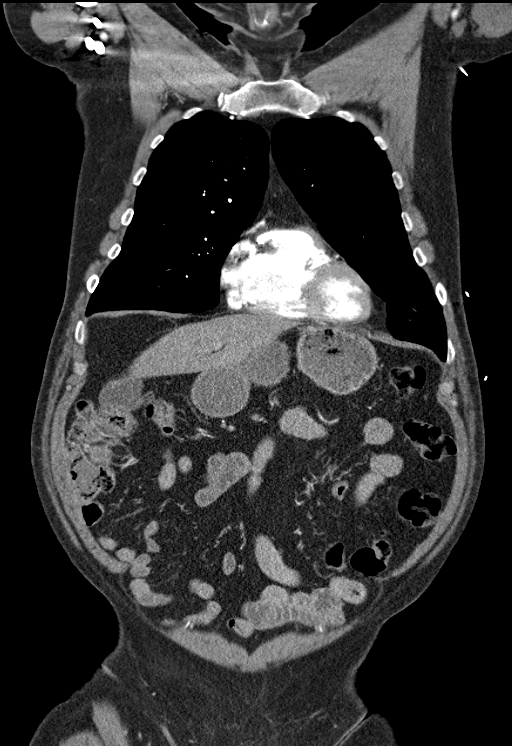
[im 74/111  soft-tissue]
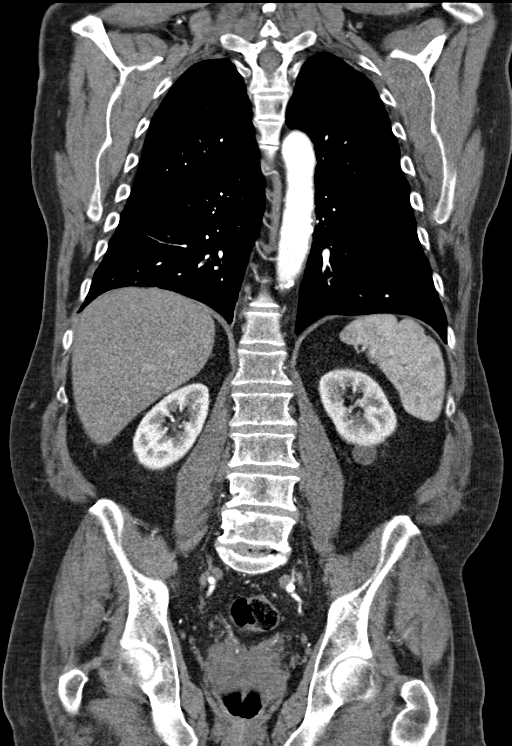

[13 of 46 positions shown; findings below may reference images not displayed]

FINDINGS: CTA CHEST FINDINGS

Normal caliber thoracic aorta without dissection, aneurysm, hematoma
or acute aortic syndrome. Mild atherosclerosis. Conventional
branching pattern from the aortic arch.

No filling defects in the pulmonary arteries to the segmental level.
Heart is normal in size. Coronary artery calcification versus stent.
Scattered normal size mediastinal nodes, no pathologic adenopathy.
No hilar adenopathy.

Lobular right apical nodule measures 1.5 x 1.3 x 2.2 cm with minimal
surrounding spiculation. No additional suspicious pulmonary nodule.
Faint reticular nodular subpleural opacity in the anterior right
upper lobe likely infectious or inflammatory. No consolidation or
pulmonary edema.

Sub cm lucency within T4 vertebral body, nonspecific. No sclerotic
lesions. No acute osseous abnormality.

Review of the MIP images confirms the above findings.

CTA ABDOMEN AND PELVIS FINDINGS

Normal caliber abdominal aorta without aneurysm, dissection,
hematoma or acute aortic abnormality. Moderate diffuse
atherosclerosis. Celiac, superior mesenteric, and inferior
mesenteric arteries are patent. Single bilateral renal arteries are
patent. Atherosclerosis of both iliac arteries without significant
stenosis.

Arterial phase imaging of the liver, spleen, and pancreas are
normal.

No adrenal nodule.

Symmetric renal enhancement. Exophytic lower pole cyst on the left
measures 2.1 cm.

Stomach distended with ingested contents. There are no dilated or
thickened bowel loops. Distal small bowel loops are fluid-filled.
Colonic tortuosity with small to moderate stool burden. No colonic
wall thickening.

Small retroperitoneal lymph nodes, not enlarged by size criteria. No
pelvic adenopathy.

Enlarged prostate gland measuring 6.6 cm transverse with mass effect
on the bladder base. Bladder physiologically distended. There is fat
in the right inguinal canal.

There are no acute or suspicious osseous abnormalities.

Review of the MIP images confirms the above findings.
IMPRESSION: 1. Normal caliber thoracoabdominal aorta without dissection or acute
aortic abnormality. Moderate atherosclerosis.
2. Lobular right apical pulmonary nodule measures 2.2 x 1.5 x 1.3 cm
with minimal spiculation, concerning for primary bronchogenic
neoplasm. There is no adenopathy. Other than a subcentimeter lucency
in T4 which is nonspecific, no evidence of metastatic disease on
this arterial phase imaging exams. This bone lucency may simply
represent normal variation. Recommend PET-CT characterization.
3. Faint reticulonodular opacity in the subpleural right lower lobe
is likely infectious or inflammatory.
4. No acute abnormality in the abdomen/pelvis. Prostatomegaly is
incidentally noted.

## 2018-02-04 IMAGING — CT CT HEAD W/O CM
2 series · 15 of 30 positions shown, 19 images · non-contrast
Comparison: MRI of the head August 20, 2015

CLINICAL DATA: Delirium, altered mental status. Recent myocardial
infarction, history of hypertension and dyslipidemia.

EXAM:
CT HEAD WITHOUT CONTRAST
TECHNIQUE: Contiguous axial images were obtained from the base of the skull
through the vertex without intravenous contrast.

[Series 201: head w/o, idose (1) · axial · non-contrast · 0.42mm/px · z∈[+981,+1111]mm · 13 of 32 slices shown, 17 images]
[im 3/32  brain]
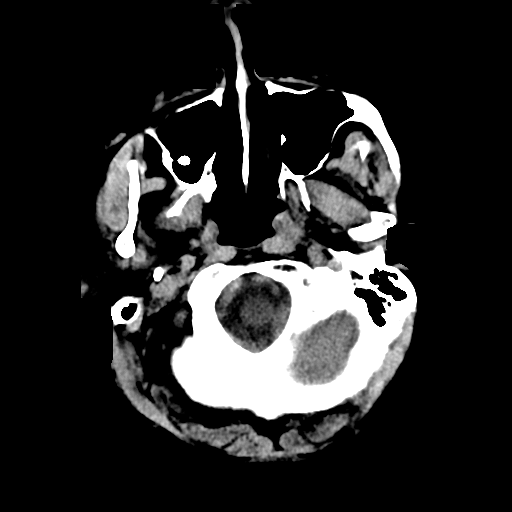
[im 3/32  bone]
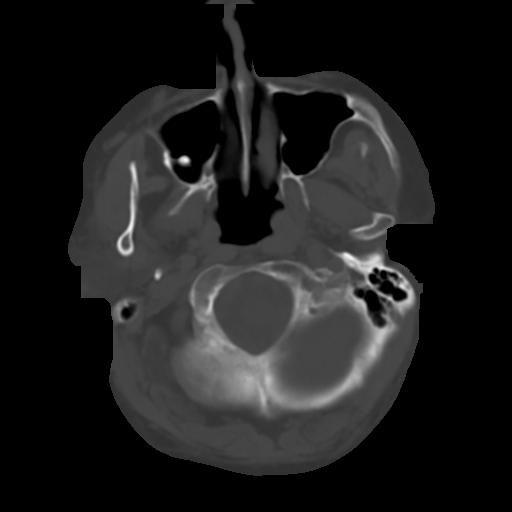
[im 5/32  brain]
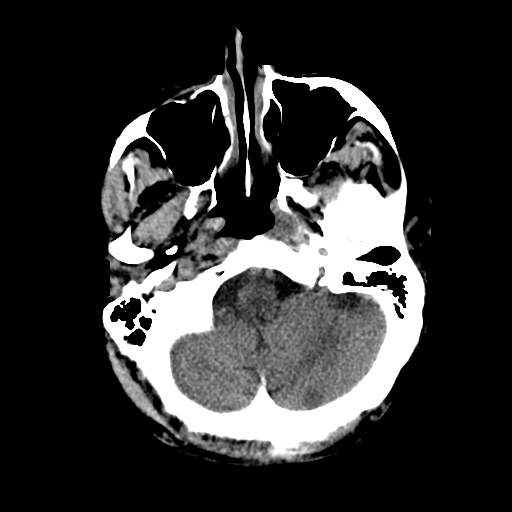
[im 7/32  brain]
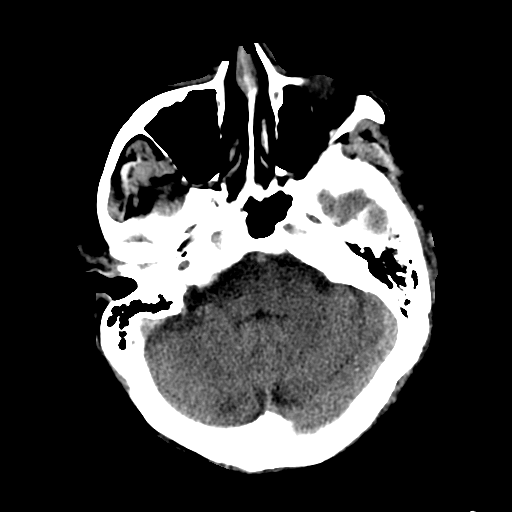
[im 9/32  brain]
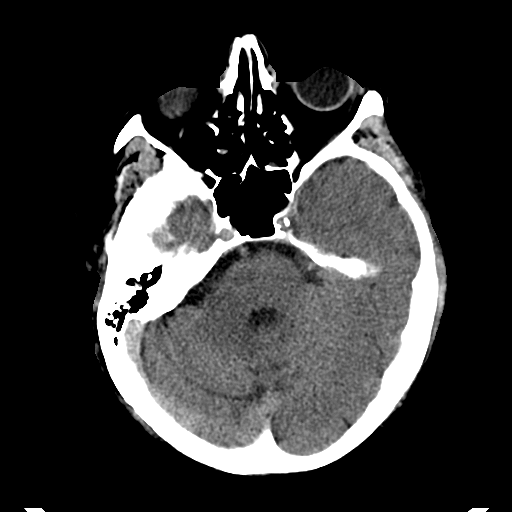
[im 12/32  brain]
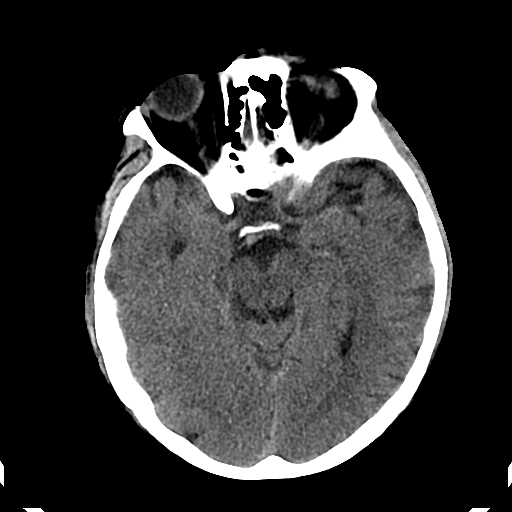
[im 12/32  bone]
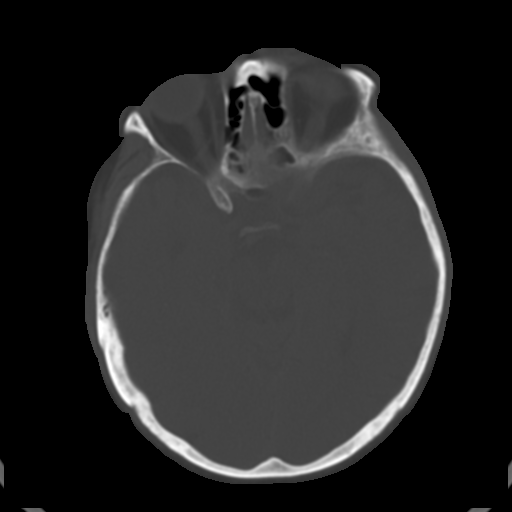
[im 14/32  brain]
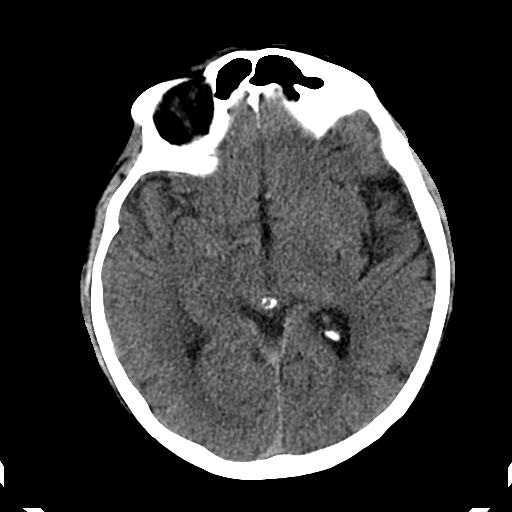
[im 16/32  brain]
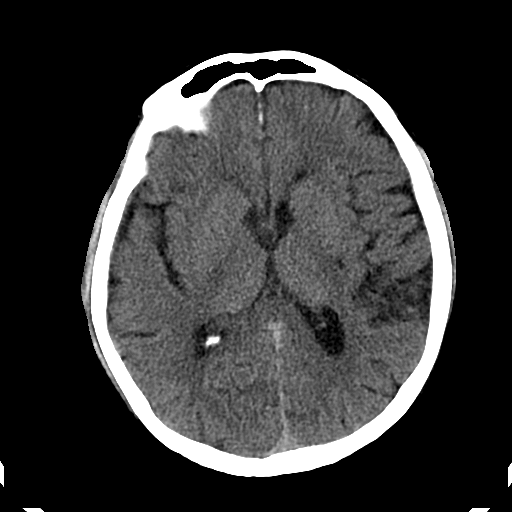
[im 18/32  brain]
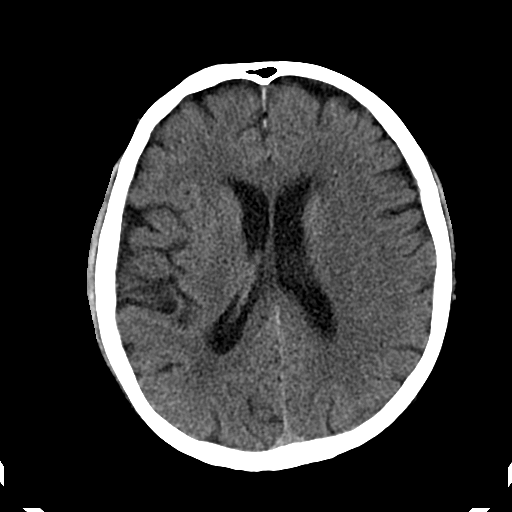
[im 20/32  brain]
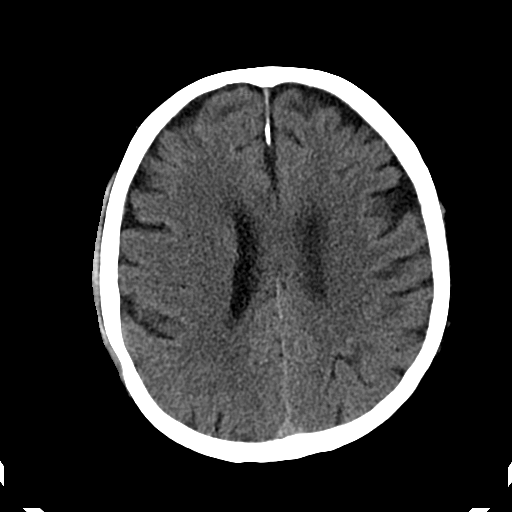
[im 20/32  bone]
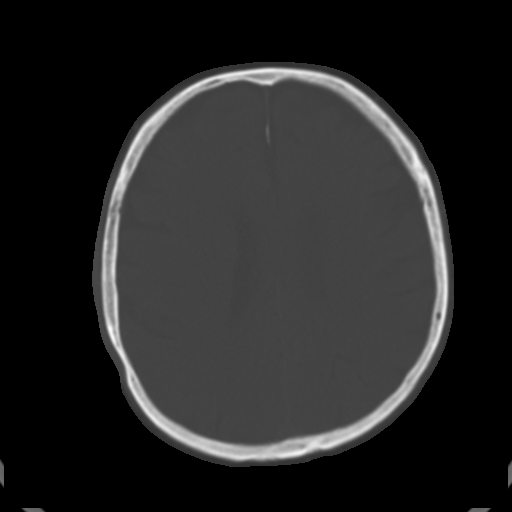
[im 23/32  brain]
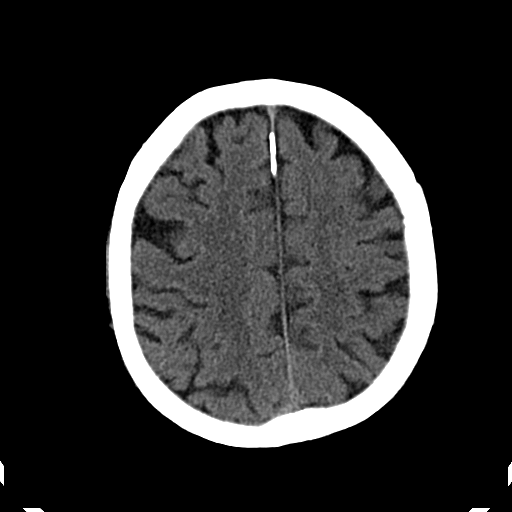
[im 25/32  brain]
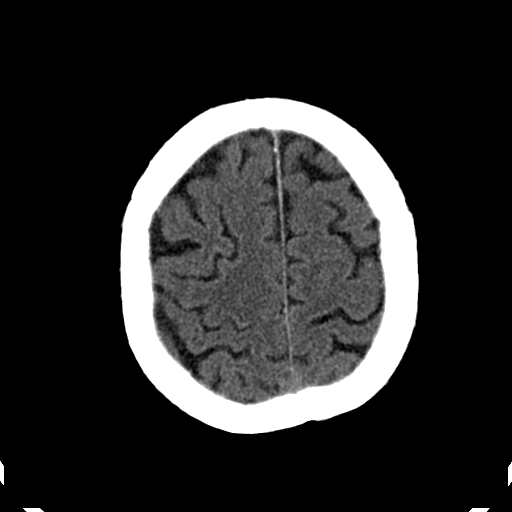
[im 27/32  brain]
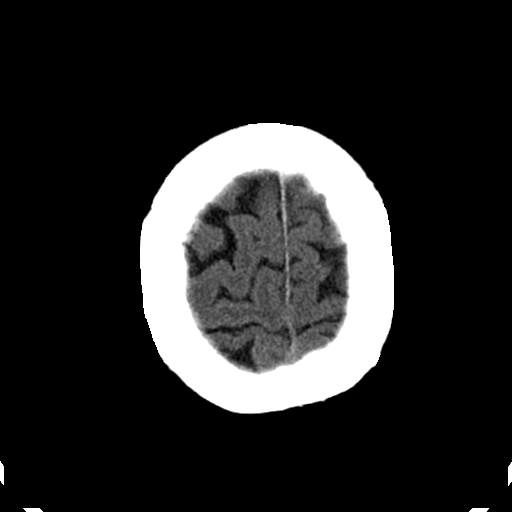
[im 29/32  brain]
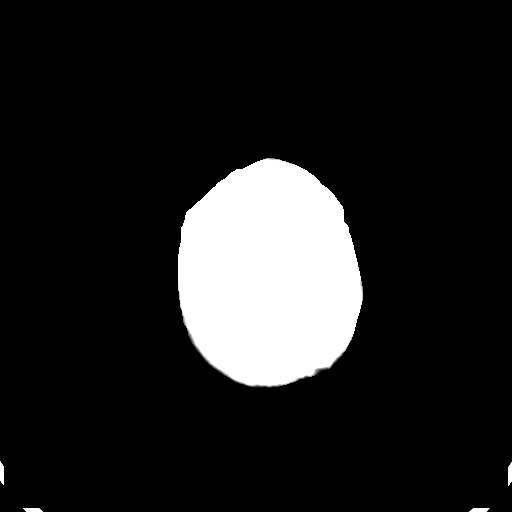
[im 29/32  bone]
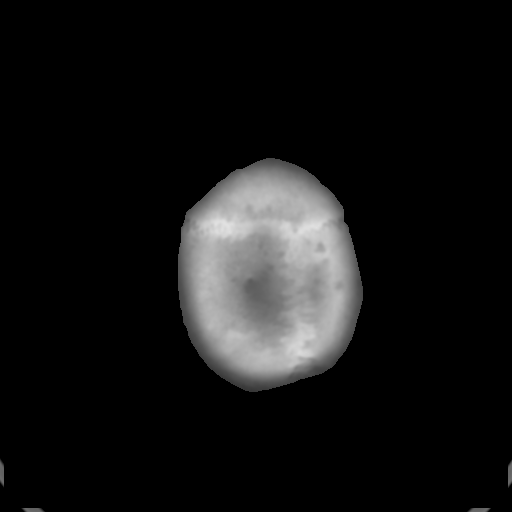

[Series 202: head w/o bone, idose (1) · axial · non-contrast · 0.42mm/px · z∈[+981,+1001]mm · 2 of 32 slices shown]
[im 3/32  bone]
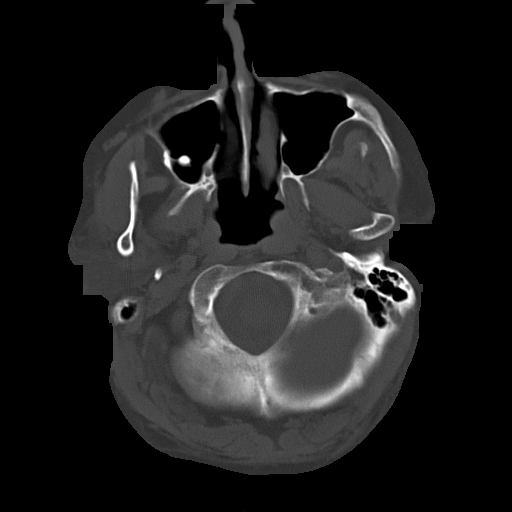
[im 7/32  bone]
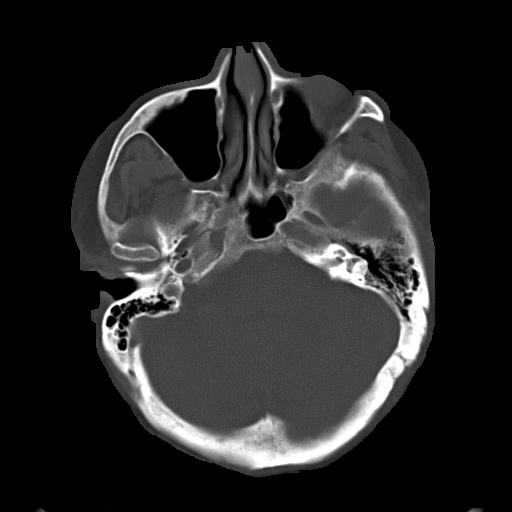

[15 of 30 positions shown; findings below may reference images not displayed]

FINDINGS: The ventricles and sulci are normal for age. No intraparenchymal
hemorrhage, mass effect nor midline shift. Patchy supratentorial
white matter hypodensities are less than expected for patient's age
and though non-specific suggest sequelae of chronic small vessel
ischemic disease. No acute large vascular territory infarcts. Old
LEFT basal ganglia lacunar infarct.

No abnormal extra-axial fluid collections. Basal cisterns are
patent. Moderate calcific atherosclerosis of the carotid siphons.

No skull fracture. The included ocular globes and orbital contents
are non-suspicious. Status post bilateral ocular lens implants. Mild
paranasal sinus mucosal thickening. Mastoid air cells are well
aerated. Patient is edentulous.
IMPRESSION: No acute intracranial process.

Involutional changes and minimal chronic small vessel ischemic
disease. Old LEFT basal ganglia lacunar infarct.
